# Patient Record
Sex: Female | Born: 1975
Health system: Southern US, Community
[De-identification: ages and names within clinical notes are randomized; demographics above are authoritative.]

## PROBLEM LIST (undated history)

## (undated) ENCOUNTER — Inpatient Hospital Stay (HOSPITAL_COMMUNITY): Payer: Self-pay

## (undated) DIAGNOSIS — D649 Anemia, unspecified: Secondary | ICD-10-CM

## (undated) DIAGNOSIS — I10 Essential (primary) hypertension: Secondary | ICD-10-CM

## (undated) DIAGNOSIS — J069 Acute upper respiratory infection, unspecified: Secondary | ICD-10-CM

## (undated) DIAGNOSIS — L509 Urticaria, unspecified: Secondary | ICD-10-CM

## (undated) DIAGNOSIS — T783XXA Angioneurotic edema, initial encounter: Secondary | ICD-10-CM

## (undated) HISTORY — PX: TONSILLECTOMY: SUR1361

## (undated) HISTORY — DX: Urticaria, unspecified: L50.9

## (undated) HISTORY — PX: MYOMECTOMY: SHX85

## (undated) HISTORY — DX: Angioneurotic edema, initial encounter: T78.3XXA

## (undated) HISTORY — DX: Acute upper respiratory infection, unspecified: J06.9

## (undated) HISTORY — PX: SINUS EXPLORATION: SHX5214

## (undated) HISTORY — PX: SINOSCOPY: SHX187

## (undated) HISTORY — PX: ADENOIDECTOMY: SUR15

---

## 2007-12-18 ENCOUNTER — Emergency Department (HOSPITAL_COMMUNITY): Admission: EM | Admit: 2007-12-18 | Discharge: 2007-12-19 | Payer: Self-pay | Admitting: Emergency Medicine

## 2011-06-28 LAB — RAPID STREP SCREEN (MED CTR MEBANE ONLY): Streptococcus, Group A Screen (Direct): NEGATIVE

## 2014-02-23 ENCOUNTER — Inpatient Hospital Stay (HOSPITAL_COMMUNITY): Payer: BC Managed Care – PPO

## 2014-02-23 ENCOUNTER — Inpatient Hospital Stay (HOSPITAL_COMMUNITY)
Admission: AD | Admit: 2014-02-23 | Discharge: 2014-02-23 | Disposition: A | Discharge status: Home / Self Care | Payer: BC Managed Care – PPO | Source: Ambulatory Visit | Attending: Obstetrics and Gynecology | Admitting: Obstetrics and Gynecology

## 2014-02-23 ENCOUNTER — Encounter (HOSPITAL_COMMUNITY): Payer: Self-pay

## 2014-02-23 DIAGNOSIS — O99330 Smoking (tobacco) complicating pregnancy, unspecified trimester: Secondary | ICD-10-CM | POA: Insufficient documentation

## 2014-02-23 DIAGNOSIS — O209 Hemorrhage in early pregnancy, unspecified: Secondary | ICD-10-CM

## 2014-02-23 DIAGNOSIS — I10 Essential (primary) hypertension: Secondary | ICD-10-CM

## 2014-02-23 DIAGNOSIS — O3410 Maternal care for benign tumor of corpus uteri, unspecified trimester: Secondary | ICD-10-CM | POA: Insufficient documentation

## 2014-02-23 DIAGNOSIS — O10019 Pre-existing essential hypertension complicating pregnancy, unspecified trimester: Secondary | ICD-10-CM | POA: Insufficient documentation

## 2014-02-23 DIAGNOSIS — O3411 Maternal care for benign tumor of corpus uteri, first trimester: Secondary | ICD-10-CM

## 2014-02-23 DIAGNOSIS — D259 Leiomyoma of uterus, unspecified: Secondary | ICD-10-CM

## 2014-02-23 HISTORY — DX: Anemia, unspecified: D64.9

## 2014-02-23 HISTORY — DX: Essential (primary) hypertension: I10

## 2014-02-23 LAB — URINALYSIS, ROUTINE W REFLEX MICROSCOPIC
BILIRUBIN URINE: NEGATIVE
Glucose, UA: NEGATIVE mg/dL
Ketones, ur: 15 mg/dL — AB
NITRITE: NEGATIVE
PH: 5.5 (ref 5.0–8.0)
Protein, ur: 30 mg/dL — AB
SPECIFIC GRAVITY, URINE: 1.01 (ref 1.005–1.030)
Urobilinogen, UA: 0.2 mg/dL (ref 0.0–1.0)

## 2014-02-23 LAB — URINE MICROSCOPIC-ADD ON

## 2014-02-23 LAB — ABO/RH: ABO/RH(D): O POS

## 2014-02-23 LAB — CBC
HCT: 45.6 % (ref 36.0–46.0)
HEMOGLOBIN: 16.5 g/dL — AB (ref 12.0–15.0)
MCH: 32.2 pg (ref 26.0–34.0)
MCHC: 36.2 g/dL — ABNORMAL HIGH (ref 30.0–36.0)
MCV: 89.1 fL (ref 78.0–100.0)
PLATELETS: 237 10*3/uL (ref 150–400)
RBC: 5.12 MIL/uL — AB (ref 3.87–5.11)
RDW: 12.7 % (ref 11.5–15.5)
WBC: 8.1 10*3/uL (ref 4.0–10.5)

## 2014-02-23 LAB — HCG, QUANTITATIVE, PREGNANCY: HCG, BETA CHAIN, QUANT, S: 8727 m[IU]/mL — AB (ref <5)

## 2014-02-23 LAB — POCT PREGNANCY, URINE: Preg Test, Ur: POSITIVE — AB

## 2014-02-23 NOTE — MAU Note (Signed)
Pt states LMP-01/07/2014, has not been to MD office thus far. Denies pain at present however began bleeding around 1315. One hour later passed quarter sized clot. Bleeding has slowed down

## 2014-02-23 NOTE — MAU Provider Note (Signed)
Chief Complaint: Vaginal Bleeding   First Provider Initiated Contact with Patient 02/23/14 1728     SUBJECTIVE HPI: Amanda Barrett is a 38 y.o. G1 at [redacted]w[redacted]d by LMP who presents with small moderate amount of bright red vaginal bleeding which has tapered to spotting. Had intercourse this morning. Denies any abdominal pain or cramping.  Has NOB visit at [place] in 5 days. She was seen there already for UPT and adjustment of antihypertensive meds. No hx fibroids.  Past Medical History  Diagnosis Date  . Hypertension   . Anemia     Pernicious   OB History  No data available   Past Surgical History  Procedure Laterality Date  . Tonsillectomy    . Sinus exploration     History   Social History  . Marital Status: Married    Spouse Name: N/A    Number of Children: N/A  . Years of Education: N/A   Occupational History  . Not on file.   Social History Main Topics  . Smoking status: Current Every Day Smoker -- 0.50 packs/day    Types: Cigarettes  . Smokeless tobacco: Never Used  . Alcohol Use: No  . Drug Use: No  . Sexual Activity: Yes    Birth Control/ Protection: None   Other Topics Concern  . Not on file   Social History Narrative  . No narrative on file   No current facility-administered medications on file prior to encounter.   No current outpatient prescriptions on file prior to encounter.   Not on File  ROS: Pertinent items in HPI  OBJECTIVE Blood pressure 139/81, pulse 71, temperature 98.2 F (36.8 C), temperature source Oral, resp. rate 16, last menstrual period 01/07/2014. GENERAL: Well-developed, well-nourished female in no acute distress.  HEENT: Normocephalic HEART: normal rate RESP: normal effort ABDOMEN: Soft, non-tender EXTREMITIES: Nontender, no edema NEURO: Alert and oriented SPECULUM EXAM: Streak of red blood on peri-pad; NEFG, small dark red blood in vault, cervix clean BIMANUAL: cervix L/C; uterus 10wks, no adnexal tenderness or  masses  LAB RESULTS Results for orders placed during the hospital encounter of 02/23/14 (from the past 24 hour(s))  URINALYSIS, ROUTINE W REFLEX MICROSCOPIC     Status: Abnormal   Collection Time    02/23/14  5:42 PM      Result Value Ref Range   Color, Urine RED (*) YELLOW   APPearance CLOUDY (*) CLEAR   Specific Gravity, Urine 1.010  1.005 - 1.030   pH 5.5  5.0 - 8.0   Glucose, UA NEGATIVE  NEGATIVE mg/dL   Hgb urine dipstick LARGE (*) NEGATIVE   Bilirubin Urine NEGATIVE  NEGATIVE   Ketones, ur 15 (*) NEGATIVE mg/dL   Protein, ur 30 (*) NEGATIVE mg/dL   Urobilinogen, UA 0.2  0.0 - 1.0 mg/dL   Nitrite NEGATIVE  NEGATIVE   Leukocytes, UA TRACE (*) NEGATIVE  URINE MICROSCOPIC-ADD ON     Status: Abnormal   Collection Time    02/23/14  5:42 PM      Result Value Ref Range   Squamous Epithelial / LPF RARE  RARE   WBC, UA 3-6  <3 WBC/hpf   RBC / HPF TOO NUMEROUS TO COUNT  <3 RBC/hpf   Bacteria, UA FEW (*) RARE  CBC     Status: Abnormal   Collection Time    02/23/14  5:50 PM      Result Value Ref Range   WBC 8.1  4.0 - 10.5 K/uL  RBC 5.12 (*) 3.87 - 5.11 MIL/uL   Hemoglobin 16.5 (*) 12.0 - 15.0 g/dL   HCT 45.6  36.0 - 46.0 %   MCV 89.1  78.0 - 100.0 fL   MCH 32.2  26.0 - 34.0 pg   MCHC 36.2 (*) 30.0 - 36.0 g/dL   RDW 12.7  11.5 - 15.5 %   Platelets 237  150 - 400 K/uL  ABO/RH     Status: None   Collection Time    02/23/14  5:50 PM      Result Value Ref Range   ABO/RH(D) O POS    POCT PREGNANCY, URINE     Status: Abnormal   Collection Time    02/23/14  5:53 PM      Result Value Ref Range   Preg Test, Ur POSITIVE (*) NEGATIVE  HCG, QUANTITATIVE, PREGNANCY     Status: Abnormal   Collection Time    02/23/14  5:55 PM      Result Value Ref Range   hCG, Beta Chain, Quant, S 8727 (*) <5 mIU/mL    IMAGING Preliminary report: IUP with FHR 84; fibroids: ant 2x 2.6x 2.6cm;, post 6.7x 6x 7cm  MAU COURSE Consulted Dr. Sandford Craze Office will schedule F/U US in about 2  wks  ASSESSMENT 1. Bleeding in early pregnancy   2. Uterine fibroids affecting pregnancy in first trimester, antepartum   3. Hypertension   38 yo G1 at [redacted]w[redacted]d by [place], viable IUP  PLAN Discharge home with bleeding precautions    Medication List         Ferrous Sulfate 27 MG Tabs  Take 1 tablet by mouth at bedtime.     NIFEdipine 90 MG 24 hr tablet  Commonly known as:  PROCARDIA XL/ADALAT-CC  Take 90 mg by mouth daily.     potassium chloride SA 20 MEQ tablet  Commonly known as:  K-DUR,KLOR-CON  Take 20 mEq by mouth daily.     prenatal multivitamin Tabs tablet  Take 1 tablet by mouth daily at 12 noon.     VIACTIV PO  Take 2 each by mouth at bedtime.       Follow-up Information   Follow up with Bovard-Stuckert, Jeral Fruit, MD. (Keep your scheduled prenatal appointment)    Specialty:  Obstetrics and Gynecology   Contact information:   [address] [place]  [address] [phone]        [name], CNM 02/23/2014  5:28 PM

## 2014-02-23 NOTE — Discharge Instructions (Signed)
Vaginal Bleeding During Pregnancy, First Trimester °A small amount of bleeding (spotting) from the vagina is relatively common in early pregnancy. It usually stops on its own. Various things may cause bleeding or spotting in early pregnancy. Some bleeding may be related to the pregnancy, and some may not. In most cases, the bleeding is normal and is not a problem. However, bleeding can also be a sign of something serious. Be sure to tell your health care provider about any vaginal bleeding right away. °Some possible causes of vaginal bleeding during the first trimester include: °· Infection or inflammation of the cervix. °· Growths (polyps) on the cervix. °· Miscarriage or threatened miscarriage. °· Pregnancy tissue has developed outside of the uterus and in a fallopian tube (tubal pregnancy). °· Tiny cysts have developed in the uterus instead of pregnancy tissue (molar pregnancy). °HOME CARE INSTRUCTIONS  °Watch your condition for any changes. The following actions may help to lessen any discomfort you are feeling: °· Follow your health care provider's instructions for limiting your activity. If your health care provider orders bed rest, you may need to stay in bed and only get up to use the bathroom. However, your health care provider may allow you to continue light activity. °· If needed, make plans for someone to help with your regular activities and responsibilities while you are on bed rest. °· Keep track of the number of pads you use each day, how often you change pads, and how soaked (saturated) they are. Write this down. °· Do not use tampons. Do not douche. °· Do not have sexual intercourse or orgasms until approved by your health care provider. °· If you pass any tissue from your vagina, save the tissue so you can show it to your health care provider. °· Only take over-the-counter or prescription medicines as directed by your health care provider. °· Do not take aspirin because it can make you  bleed. °· Keep all follow-up appointments as directed by your health care provider. °SEEK MEDICAL CARE IF: °· You have any vaginal bleeding during any part of your pregnancy. °· You have cramps or labor pains. °SEEK IMMEDIATE MEDICAL CARE IF:  °· You have severe cramps in your back or belly (abdomen). °· You have a fever, not controlled by medicine. °· You pass large clots or tissue from your vagina. °· Your bleeding increases. °· You feel lightheaded or weak, or you have fainting episodes. °· You have chills. °· You are leaking fluid or have a gush of fluid from your vagina. °· You pass out while having a bowel movement. °MAKE SURE YOU: °· Understand these instructions. °· Will watch your condition. °· Will get help right away if you are not doing well or get worse. °Document Released: 06/30/2005 Document Revised: 07/11/2013 Document Reviewed: 05/28/2013 °ExitCare® Patient Information ©2014 [employer]. ° °

## 2014-07-26 ENCOUNTER — Other Ambulatory Visit: Payer: Self-pay | Admitting: Obstetrics and Gynecology

## 2014-07-26 DIAGNOSIS — R234 Changes in skin texture: Secondary | ICD-10-CM

## 2014-08-05 ENCOUNTER — Encounter (HOSPITAL_COMMUNITY): Payer: Self-pay

## 2014-08-13 ENCOUNTER — Ambulatory Visit
Admission: RE | Admit: 2014-08-13 | Discharge: 2014-08-13 | Disposition: A | Discharge status: Home / Self Care | Payer: BC Managed Care – PPO | Source: Ambulatory Visit | Attending: Obstetrics and Gynecology | Admitting: Obstetrics and Gynecology

## 2014-08-13 DIAGNOSIS — R234 Changes in skin texture: Secondary | ICD-10-CM

## 2016-01-09 ENCOUNTER — Other Ambulatory Visit: Payer: Self-pay | Admitting: Obstetrics and Gynecology

## 2016-01-09 DIAGNOSIS — R234 Changes in skin texture: Secondary | ICD-10-CM

## 2016-01-09 MED FILL — METHYLDOPA 250 MG TABLET: 250 | 30 days supply | Qty: 120 | Fill #0

## 2016-01-14 ENCOUNTER — Ambulatory Visit
Admission: RE | Admit: 2016-01-14 | Discharge: 2016-01-14 | Disposition: A | Discharge status: Home / Self Care | Payer: BLUE CROSS/BLUE SHIELD | Source: Ambulatory Visit | Attending: Obstetrics and Gynecology | Admitting: Obstetrics and Gynecology

## 2016-01-14 DIAGNOSIS — R234 Changes in skin texture: Secondary | ICD-10-CM

## 2016-04-28 MED FILL — LETROZOLE 2.5 MG TABLET: 2.5 | 5 days supply | Qty: 5 | Fill #0

## 2016-04-30 MED FILL — LETROZOLE 2.5 MG TABLET: 2.5 | 5 days supply | Qty: 10 | Fill #0

## 2017-03-21 ENCOUNTER — Other Ambulatory Visit: Payer: Self-pay | Admitting: Obstetrics and Gynecology

## 2017-03-21 DIAGNOSIS — N6320 Unspecified lump in the left breast, unspecified quadrant: Secondary | ICD-10-CM

## 2017-03-29 ENCOUNTER — Ambulatory Visit
Admission: RE | Admit: 2017-03-29 | Discharge: 2017-03-29 | Disposition: A | Discharge status: Home / Self Care | Payer: 59 | Source: Ambulatory Visit | Attending: Obstetrics and Gynecology | Admitting: Obstetrics and Gynecology

## 2017-03-29 ENCOUNTER — Other Ambulatory Visit: Payer: Self-pay | Admitting: Obstetrics and Gynecology

## 2017-03-29 DIAGNOSIS — N6320 Unspecified lump in the left breast, unspecified quadrant: Secondary | ICD-10-CM

## 2017-12-09 MED FILL — METHYLDOPA 500 MG TABLET: 500 | 30 days supply | Qty: 60 | Fill #0

## 2018-07-10 ENCOUNTER — Encounter: Payer: Self-pay | Admitting: Family Medicine

## 2018-09-14 ENCOUNTER — Encounter: Payer: Self-pay | Admitting: Family Medicine

## 2018-09-14 ENCOUNTER — Ambulatory Visit (INDEPENDENT_AMBULATORY_CARE_PROVIDER_SITE_OTHER): Payer: 59 | Admitting: Family Medicine

## 2018-09-14 VITALS — BP 184/112 | HR 99 | Temp 97.9°F | Ht 68.0 in | Wt 248.4 lb

## 2018-09-14 DIAGNOSIS — I1A0 Resistant hypertension: Secondary | ICD-10-CM | POA: Insufficient documentation

## 2018-09-14 DIAGNOSIS — Z7689 Persons encountering health services in other specified circumstances: Secondary | ICD-10-CM | POA: Diagnosis not present

## 2018-09-14 DIAGNOSIS — Z720 Tobacco use: Secondary | ICD-10-CM

## 2018-09-14 DIAGNOSIS — Z23 Encounter for immunization: Secondary | ICD-10-CM | POA: Diagnosis not present

## 2018-09-14 DIAGNOSIS — I10 Essential (primary) hypertension: Secondary | ICD-10-CM | POA: Diagnosis not present

## 2018-09-14 DIAGNOSIS — N6019 Diffuse cystic mastopathy of unspecified breast: Secondary | ICD-10-CM

## 2018-09-14 DIAGNOSIS — F5101 Primary insomnia: Secondary | ICD-10-CM

## 2018-09-14 DIAGNOSIS — R4589 Other symptoms and signs involving emotional state: Secondary | ICD-10-CM

## 2018-09-14 DIAGNOSIS — F329 Major depressive disorder, single episode, unspecified: Secondary | ICD-10-CM

## 2018-09-14 DIAGNOSIS — Z6837 Body mass index (BMI) 37.0-37.9, adult: Secondary | ICD-10-CM

## 2018-09-14 HISTORY — DX: Resistant hypertension: I1A.0

## 2018-09-14 MED ORDER — LOSARTAN POTASSIUM-HCTZ 50-12.5 MG PO TABS: 1.0000 | ORAL_TABLET | Freq: Every day | ORAL | 3 refills | Status: DC

## 2018-09-14 MED ORDER — ZOLPIDEM TARTRATE 10 MG PO TABS: 10.0000 mg | ORAL_TABLET | Freq: Every day | ORAL | 2 refills | Status: DC

## 2018-09-14 MED ORDER — BUPROPION HCL ER (SR) 150 MG PO TB12: 150.0000 mg | ORAL_TABLET | Freq: Two times a day (BID) | ORAL | 3 refills | Status: DC

## 2018-09-14 NOTE — Patient Instructions (Addendum)
Hypertension Hypertension, commonly called high blood pressure, is when the force of blood pumping through the arteries is too strong. The arteries are the blood vessels that carry blood from the heart throughout the body. Hypertension forces the heart to work harder to pump blood and may cause arteries to become narrow or stiff. Having untreated or uncontrolled hypertension can cause heart attacks, strokes, kidney disease, and other problems. A blood pressure reading consists of a higher number over a lower number. Ideally, your blood pressure should be below 120/80. The first ("top") number is called the systolic pressure. It is a measure of the pressure in your arteries as your heart beats. The second ("bottom") number is called the diastolic pressure. It is a measure of the pressure in your arteries as the heart relaxes. What are the causes? The cause of this condition is not known. What increases the risk? Some risk factors for high blood pressure are under your control. Others are not. Factors you can change  Smoking.  Having type 2 diabetes mellitus, high cholesterol, or both.  Not getting enough exercise or physical activity.  Being overweight.  Having too much fat, sugar, calories, or salt (sodium) in your diet.  Drinking too much alcohol. Factors that are difficult or impossible to change  Having chronic kidney disease.  Having a family history of high blood pressure.  Age. Risk increases with age.  Race. You may be at higher risk if you are African-American.  Gender. Men are at higher risk than women before age 45. After age 65, women are at higher risk than men.  Having obstructive sleep apnea.  Stress. What are the signs or symptoms? Extremely high blood pressure (hypertensive crisis) may cause:  Headache.  Anxiety.  Shortness of breath.  Nosebleed.  Nausea and vomiting.  Severe chest pain.  Jerky movements you cannot control (seizures).  How is this  diagnosed? This condition is diagnosed by measuring your blood pressure while you are seated, with your arm resting on a surface. The cuff of the blood pressure monitor will be placed directly against the skin of your upper arm at the level of your heart. It should be measured at least twice using the same arm. Certain conditions can cause a difference in blood pressure between your right and left arms. Certain factors can cause blood pressure readings to be lower or higher than normal (elevated) for a short period of time:  When your blood pressure is higher when you are in a health care provider's office than when you are at home, this is called white coat hypertension. Most people with this condition do not need medicines.  When your blood pressure is higher at home than when you are in a health care provider's office, this is called masked hypertension. Most people with this condition may need medicines to control blood pressure.  If you have a high blood pressure reading during one visit or you have normal blood pressure with other risk factors:  You may be asked to return on a different day to have your blood pressure checked again.  You may be asked to monitor your blood pressure at home for 1 week or longer.  If you are diagnosed with hypertension, you may have other blood or imaging tests to help your health care provider understand your overall risk for other conditions. How is this treated? This condition is treated by making healthy lifestyle changes, such as eating healthy foods, exercising more, and reducing your alcohol intake. Your   health care provider may prescribe medicine if lifestyle changes are not enough to get your blood pressure under control, and if:  Your systolic blood pressure is above 130.  Your diastolic blood pressure is above 80.  Your personal target blood pressure may vary depending on your medical conditions, your age, and other factors. Follow these  instructions at home: Eating and drinking  Eat a diet that is high in fiber and potassium, and low in sodium, added sugar, and fat. An example eating plan is called the DASH (Dietary Approaches to Stop Hypertension) diet. To eat this way: ? Eat plenty of fresh fruits and vegetables. Try to fill half of your plate at each meal with fruits and vegetables. ? Eat whole grains, such as whole wheat pasta, brown rice, or whole grain bread. Fill about one quarter of your plate with whole grains. ? Eat or drink low-fat dairy products, such as skim milk or low-fat yogurt. ? Avoid fatty cuts of meat, processed or cured meats, and poultry with skin. Fill about one quarter of your plate with lean proteins, such as fish, chicken without skin, beans, eggs, and tofu. ? Avoid premade and processed foods. These tend to be higher in sodium, added sugar, and fat.  Reduce your daily sodium intake. Most people with hypertension should eat less than 1,500 mg of sodium a day.  Limit alcohol intake to no more than 1 drink a day for nonpregnant women and 2 drinks a day for men. One drink equals 12 oz of beer, 5 oz of wine, or 1 oz of hard liquor. Lifestyle  Work with your health care provider to maintain a healthy body weight or to lose weight. Ask what an ideal weight is for you.  Get at least 30 minutes of exercise that causes your heart to beat faster (aerobic exercise) most days of the week. Activities may include walking, swimming, or biking.  Include exercise to strengthen your muscles (resistance exercise), such as pilates or lifting weights, as part of your weekly exercise routine. Try to do these types of exercises for 30 minutes at least 3 days a week.  Do not use any products that contain nicotine or tobacco, such as cigarettes and e-cigarettes. If you need help quitting, ask your health care provider.  Monitor your blood pressure at home as told by your health care provider.  Keep all follow-up visits as  told by your health care provider. This is important. Medicines  Take over-the-counter and prescription medicines only as told by your health care provider. Follow directions carefully. Blood pressure medicines must be taken as prescribed.  Do not skip doses of blood pressure medicine. Doing this puts you at risk for problems and can make the medicine less effective.  Ask your health care provider about side effects or reactions to medicines that you should watch for. Contact a health care provider if:  You think you are having a reaction to a medicine you are taking.  You have headaches that keep coming back (recurring).  You feel dizzy.  You have swelling in your ankles.  You have trouble with your vision. Get help right away if:  You develop a severe headache or confusion.  You have unusual weakness or numbness.  You feel faint.  You have severe pain in your chest or abdomen.  You vomit repeatedly.  You have trouble breathing. Summary  Hypertension is when the force of blood pumping through your arteries is too strong. If this condition is not   controlled, it may put you at risk for serious complications.  Your personal target blood pressure may vary depending on your medical conditions, your age, and other factors. For most people, a normal blood pressure is less than 120/80.  Hypertension is treated with lifestyle changes, medicines, or a combination of both. Lifestyle changes include weight loss, eating a healthy, low-sodium diet, exercising more, and limiting alcohol. This information is not intended to replace advice given to you by your health care provider. Make sure you discuss any questions you have with your health care provider. Document Released: 09/20/2005 Document Revised: 08/18/2016 Document Reviewed: 08/18/2016 Elsevier Interactive Patient Education  2018 [employer].   Sciatica Rehab Ask your health care provider which exercises are safe for you. Do  exercises exactly as told by your health care provider and adjust them as directed. It is normal to feel mild stretching, pulling, tightness, or discomfort as you do these exercises, but you should stop right away if you feel sudden pain or your pain gets worse.Do not begin these exercises until told by your health care provider. Stretching and range of motion exercises These exercises warm up your muscles and joints and improve the movement and flexibility of your hips and your back. These exercises also help to relieve pain, numbness, and tingling. Exercise A: Sciatic nerve glide 1. Sit in a chair with your head facing down toward your chest. Place your hands behind your back. Let your shoulders slump forward. 2. Slowly straighten one of your knees while you tilt your head back as if you are looking toward the ceiling. Only straighten your leg as far as you can without making your symptoms worse. 3. Hold for __________ seconds. 4. Slowly return to the starting position. 5. Repeat with your other leg. Repeat __________ times. Complete this exercise __________ times a day. Exercise B: Knee to chest with hip adduction and internal rotation  1. Lie on your back on a firm surface with both legs straight. 2. Bend one of your knees and move it up toward your chest until you feel a gentle stretch in your lower back and buttock. Then, move your knee toward the shoulder that is on the opposite side from your leg. ? Hold your leg in this position by holding onto the front of your knee. 3. Hold for __________ seconds. 4. Slowly return to the starting position. 5. Repeat with your other leg. Repeat __________ times. Complete this exercise __________ times a day. Exercise C: Prone extension on elbows  1. Lie on your abdomen on a firm surface. A bed may be too soft for this exercise. 2. Prop yourself up on your elbows. 3. Use your arms to help lift your chest up until you feel a gentle stretch in your  abdomen and your lower back. ? This will place some of your body weight on your elbows. If this is uncomfortable, try stacking pillows under your chest. ? Your hips should stay down, against the surface that you are lying on. Keep your hip and back muscles relaxed. 4. Hold for __________ seconds. 5. Slowly relax your upper body and return to the starting position. Repeat __________ times. Complete this exercise __________ times a day. Strengthening exercises These exercises build strength and endurance in your back. Endurance is the ability to use your muscles for a long time, even after they get tired. Exercise D: Pelvic tilt 1. Lie on your back on a firm surface. Bend your knees and keep your feet flat. 2.  Tense your abdominal muscles. Tip your pelvis up toward the ceiling and flatten your lower back into the floor. ? To help with this exercise, you may place a small towel under your lower back and try to push your back into the towel. 3. Hold for __________ seconds. 4. Let your muscles relax completely before you repeat this exercise. Repeat __________ times. Complete this exercise __________ times a day. Exercise E: Alternating arm and leg raises  1. Get on your hands and knees on a firm surface. If you are on a hard floor, you may want to use padding to cushion your knees, such as an exercise mat. 2. Line up your arms and legs. Your hands should be below your shoulders, and your knees should be below your hips. 3. Lift your left leg behind you. At the same time, raise your right arm and straighten it in front of you. ? Do not lift your leg higher than your hip. ? Do not lift your arm higher than your shoulder. ? Keep your abdominal and back muscles tight. ? Keep your hips facing the ground. ? Do not arch your back. ? Keep your balance carefully, and do not hold your breath. 4. Hold for __________ seconds. 5. Slowly return to the starting position and repeat with your right leg and  your left arm. Repeat __________ times. Complete this exercise __________ times a day. Posture and body mechanics  Body mechanics refers to the movements and positions of your body while you do your daily activities. Posture is part of body mechanics. Good posture and healthy body mechanics can help to relieve stress in your body's tissues and joints. Good posture means that your spine is in its natural S-curve position (your spine is neutral), your shoulders are pulled back slightly, and your head is not tipped forward. The following are general guidelines for applying improved posture and body mechanics to your everyday activities. Standing   When standing, keep your spine neutral and your feet about hip-width apart. Keep a slight bend in your knees. Your ears, shoulders, and hips should line up.  When you do a task in which you stand in one place for a long time, place one foot up on a stable object that is 2-4 inches (5-10 cm) high, such as a footstool. This helps keep your spine neutral. Sitting   When sitting, keep your spine neutral and keep your feet flat on the floor. Use a footrest, if necessary, and keep your thighs parallel to the floor. Avoid rounding your shoulders, and avoid tilting your head forward.  When working at a desk or a computer, keep your desk at a height where your hands are slightly lower than your elbows. Slide your chair under your desk so you are close enough to maintain good posture.  When working at a computer, place your monitor at a height where you are looking straight ahead and you do not have to tilt your head forward or downward to look at the screen. Resting   When lying down and resting, avoid positions that are most painful for you.  If you have pain with activities such as sitting, bending, stooping, or squatting (flexion-based activities), lie in a position in which your body does not bend very much. For example, avoid curling up on your side with  your arms and knees near your chest (fetal position).  If you have pain with activities such as standing for a long time or reaching with your arms (extension-based  activities), lie with your spine in a neutral position and bend your knees slightly. Try the following positions: ? Lying on your side with a pillow between your knees. ? Lying on your back with a pillow under your knees. Lifting   When lifting objects, keep your feet at least shoulder-width apart and tighten your abdominal muscles.  Bend your knees and hips and keep your spine neutral. It is important to lift using the strength of your legs, not your back. Do not lock your knees straight out.  Always ask for help to lift heavy or awkward objects. This information is not intended to replace advice given to you by your health care provider. Make sure you discuss any questions you have with your health care provider. Document Released: 09/20/2005 Document Revised: 05/27/2016 Document Reviewed: 06/06/2015 Elsevier Interactive Patient Education  [employer].

## 2018-09-14 NOTE — Progress Notes (Signed)
Amanda Barrett is a 42 y.o. female  Chief Complaint  Patient presents with  . Establish Care    discuss bp and bp meds . last pap fall of 2017 and last mamm 03/2017    HPI: LABRITTANY Barrett is a 42 y.o. female here as a new patient to establish care with our office. She was previously seen by a physician in [place] but more recently saw a PA at that office.  She would like do discuss her blood pressure and BP meds. She has been off one of her meds (methyldopa) x months. She ran out of her other 2 BP meds 1 week ago. She would like to "start over" with BP regimen.  Pt states she feels fine despite her BP being elevated in the office today. Denies HA, vision changes, dizziness, CP, SOB, LE edema.   Specialists: none  Last CPE, labs: overdue  Last PAP: fall of 2017 - normal; due in fall 2020 Last mammo: due  Last Dexa: n/a Last colonoscopy: n/a  Med refills needed today: she is out of BP meds   Past Medical History:  Diagnosis Date  . Anemia    Pernicious  . Hypertension     Past Surgical History:  Procedure Laterality Date  . SINUS EXPLORATION    . TONSILLECTOMY      Social History   Socioeconomic History  . Marital status: Married    Spouse name: Not on file  . Number of children: Not on file  . Years of education: Not on file  . Highest education level: Not on file  Occupational History  . Not on file  Social Needs  . Financial resource strain: Not on file  . Food insecurity:    Worry: Not on file    Inability: Not on file  . Transportation needs:    Medical: Not on file    Non-medical: Not on file  Tobacco Use  . Smoking status: Current Every Day Smoker    Packs/day: 0.50    Types: Cigarettes  . Smokeless tobacco: Never Used  Substance and Sexual Activity  . Alcohol use: No  . Drug use: No  . Sexual activity: Yes    Birth control/protection: None  Lifestyle  . Physical activity:    Days per week: Not on file    Minutes per session: Not on file    . Stress: Not on file  Relationships  . Social connections:    Talks on phone: Not on file    Gets together: Not on file    Attends religious service: Not on file    Active member of club or organization: Not on file    Attends meetings of clubs or organizations: Not on file    Relationship status: Not on file  . Intimate partner violence:    Fear of current or ex partner: Not on file    Emotionally abused: Not on file    Physically abused: Not on file    Forced sexual activity: Not on file  Other Topics Concern  . Not on file  Social History Narrative  . Not on file    No family history on file.   Immunization History  Administered Date(s) Administered  . Influenza,inj,Quad PF,6+ Mos 09/14/2018  . Tdap 09/14/2018    Outpatient Encounter Medications as of 09/14/2018  Medication Sig  . albuterol (PROAIR HFA) 108 (90 Base) MCG/ACT inhaler ProAir HFA 90 mcg/actuation aerosol inhaler  INHALE 1-2 PUFFS EVERY 4-6 HOURS  .  buPROPion (WELLBUTRIN SR) 150 MG 12 hr tablet Take 1 tablet (150 mg total) by mouth 2 (two) times daily.  Marland Kitchen zolpidem (AMBIEN) 10 MG tablet Take 1 tablet (10 mg total) by mouth at bedtime.  . [DISCONTINUED] buPROPion (WELLBUTRIN SR) 150 MG 12 hr tablet bupropion HCl SR 150 mg tablet,12 hr sustained-release  TAKE 1 TABLET BY MOUTH TWICE A DAY  . [DISCONTINUED] labetalol (NORMODYNE) 100 MG tablet labetalol 100 mg tablet  TAKE 1 TABLET BY MOUTH 2 TIMES DAILY  . [DISCONTINUED] methyldopa (ALDOMET) 250 MG tablet methyldopa 250 mg tablet  TAKE 2 TABLETS BY MOUTH TWICE A DAY  . [DISCONTINUED] telmisartan-hydrochlorothiazide (MICARDIS HCT) 40-12.5 MG tablet Take 1 tablet by mouth daily.  . [DISCONTINUED] zolpidem (AMBIEN) 10 MG tablet zolpidem 10 mg tablet  TAKE 1 TABLET BY MOUTH AT BEDTIME  . Calcium-Vitamin D-Vitamin K (VIACTIV PO) Take 2 each by mouth at bedtime.  . Ferrous Sulfate 27 MG TABS Take 1 tablet by mouth at bedtime.  Marland Kitchen losartan-hydrochlorothiazide  (HYZAAR) 50-12.5 MG tablet Take 1 tablet by mouth daily.  . potassium chloride SA (K-DUR,KLOR-CON) 20 MEQ tablet Take 20 mEq by mouth daily.  . Prenatal Vit-Fe Fumarate-FA (PRENATAL MULTIVITAMIN) TABS tablet Take 1 tablet by mouth daily at 12 noon.  . [DISCONTINUED] NIFEdipine (PROCARDIA XL/ADALAT-CC) 90 MG 24 hr tablet Take 90 mg by mouth daily.   No facility-administered encounter medications on file as of 09/14/2018.      ROS: Gen: no fever, chills  Skin: no rash, itching ENT: no ear pain, ear drainage, nasal congestion, rhinorrhea, sinus pressure, sore throat Eyes: no blurry vision, double vision Resp: no cough, wheeze,SOB CV: no CP, palpitations, LE edema,  GI: no heartburn, n/v/d/c, abd pain GU: no dysuria, urgency, frequency, hematuria  MSK: no joint pain, myalgias, back pain Neuro: no dizziness, headache, weakness, vertigo; Lt sciatica intermittent Psych: no depression, anxiety, insomnia   No Known Allergies  BP (!) 184/112 (BP Location: Left Arm, Patient Position: Sitting, Cuff Size: Large)   Pulse 99   Temp 97.9 F (36.6 C) (Oral)   Ht 5\' 8"  (1.727 m)   Wt 248 lb 6.4 oz (112.7 kg)   LMP 08/22/2018   SpO2 96%   BMI 37.77 kg/m   BP Readings from Last 3 Encounters:  09/14/18 (!) 184/112  02/23/14 140/88     Physical Exam  Constitutional: She is oriented to person, place, and time. She appears well-developed and well-nourished. No distress.  Neck: No thyromegaly present.  Cardiovascular: Normal rate, regular rhythm, normal heart sounds and intact distal pulses.  No murmur heard. Pulmonary/Chest: Effort normal. No respiratory distress. She has no wheezes. She has no rhonchi.  Musculoskeletal:        General: No edema.  Lymphadenopathy:    She has no cervical adenopathy.  Neurological: She is alert and oriented to person, place, and time.  Skin: Skin is warm and dry.  Psychiatric: She has a normal mood and affect. Her behavior is normal.     A/P:  1.  Essential hypertension - uncontrolled and pt off 1/3 meds x months and 2/3 x 1 week - strong fam h/o HTN and pt diagnosed at 42yo - stressed importance of weight loss, low sodium diet, and regular CV exercise in HTN management - Basic metabolic panel Rx: - losartan-hydrochlorothiazide (HYZAAR) 50-12.5 MG tablet; Take 1 tablet by mouth daily.  Dispense: 90 tablet; Refill: 3 - pt will check BP 3-4x/wk x 1.5 wks and send MyChart message with readings.  Will likely have to increase dose at that time as I don't believe BP will be at goal. Pt will f/u in office in about 4 weeks for CPE, fasting labs, and HTN f/u  2. Encounter to establish care with new doctor  3. Fibrocystic breast disease (FCBD), unspecified laterality - MM Digital Diagnostic Bilat; Future  4. Tobacco use - pt states she has cut down significantly since starting this med and would like to continue with it Refill: - buPROPion (WELLBUTRIN SR) 150 MG 12 hr tablet; Take 1 tablet (150 mg total) by mouth 2 (two) times daily.  Dispense: 180 tablet; Refill: 3  5. Depressed mood Refill: - buPROPion (WELLBUTRIN SR) 150 MG 12 hr tablet; Take 1 tablet (150 mg total) by mouth 2 (two) times daily.  Dispense: 180 tablet; Refill: 3  6. Primary insomnia Refill: - zolpidem (AMBIEN) 10 MG tablet; Take 1 tablet (10 mg total) by mouth at bedtime.  Dispense: 30 tablet; Refill: 2 - pt has been on ambien x years, appears to be taking and filling appropriately  7. Adult BMI 37.0-37.9 kg/sq m - ALT - AST - Basic metabolic panel - Lipid panel  8. Need for immunization against influenza - Flu Vaccine QUAD 36+ mos IM  9. Need for Tdap vaccination - Tdap vaccine greater than or equal to 7yo IM  I spent 45 min with the patient today and greater than 50% was spent in counseling, coordination of care, education

## 2018-09-29 ENCOUNTER — Encounter: Payer: Self-pay | Admitting: Family Medicine

## 2018-09-29 ENCOUNTER — Other Ambulatory Visit: Payer: Self-pay | Admitting: Family Medicine

## 2018-09-29 DIAGNOSIS — I10 Essential (primary) hypertension: Secondary | ICD-10-CM

## 2018-09-29 MED ORDER — LOSARTAN POTASSIUM-HCTZ 100-25 MG PO TABS: 1.0000 | ORAL_TABLET | Freq: Every day | ORAL | 3 refills | Status: DC

## 2018-10-05 ENCOUNTER — Telehealth: Payer: Self-pay | Admitting: Family Medicine

## 2018-10-05 DIAGNOSIS — I10 Essential (primary) hypertension: Secondary | ICD-10-CM

## 2018-10-05 MED ORDER — LOSARTAN POTASSIUM-HCTZ 100-25 MG PO TABS: 1.0000 | ORAL_TABLET | Freq: Every day | ORAL | 3 refills | Status: DC

## 2018-10-05 NOTE — Telephone Encounter (Signed)
Pt called stating losartan-hydrochlorothiazide need to be send [facility] instead of [facility].   Rx cancel from [facility] and sent to [facility] as pt requested.

## 2018-10-06 ENCOUNTER — Other Ambulatory Visit: Payer: Self-pay

## 2018-10-06 MED ORDER — LOSARTAN POTASSIUM 100 MG PO TABS: 100.0000 mg | ORAL_TABLET | Freq: Every day | ORAL | 1 refills | Status: DC

## 2018-10-06 MED ORDER — HYDROCHLOROTHIAZIDE 25 MG PO TABS: 25.0000 mg | ORAL_TABLET | Freq: Every day | ORAL | 1 refills | Status: DC

## 2018-10-18 ENCOUNTER — Encounter: Payer: Self-pay | Admitting: Family Medicine

## 2018-10-18 ENCOUNTER — Ambulatory Visit (INDEPENDENT_AMBULATORY_CARE_PROVIDER_SITE_OTHER): Payer: 59 | Admitting: Family Medicine

## 2018-10-18 VITALS — BP 148/108 | HR 70 | Temp 98.6°F | Ht 66.0 in | Wt 246.0 lb

## 2018-10-18 DIAGNOSIS — I10 Essential (primary) hypertension: Secondary | ICD-10-CM

## 2018-10-18 DIAGNOSIS — Z1239 Encounter for other screening for malignant neoplasm of breast: Secondary | ICD-10-CM

## 2018-10-18 DIAGNOSIS — Z0000 Encounter for general adult medical examination without abnormal findings: Secondary | ICD-10-CM

## 2018-10-18 LAB — BASIC METABOLIC PANEL
BUN: 13 mg/dL (ref 6–23)
CO2: 23 meq/L (ref 19–32)
Calcium: 9.6 mg/dL (ref 8.4–10.5)
Chloride: 102 mEq/L (ref 96–112)
Creatinine, Ser: 1.08 mg/dL (ref 0.40–1.20)
GFR: 59.56 mL/min — ABNORMAL LOW (ref >60.00)
Glucose, Bld: 90 mg/dL (ref 70–99)
Potassium: 3.7 mEq/L (ref 3.5–5.1)
Sodium: 138 mEq/L (ref 135–145)

## 2018-10-18 LAB — AST: AST: 31 U/L (ref 0–37)

## 2018-10-18 LAB — LIPID PANEL
CHOL/HDL RATIO: 3
Cholesterol: 174 mg/dL (ref 0–200)
HDL: 64.7 mg/dL (ref >39.00)
LDL Cholesterol: 95 mg/dL (ref 0–99)
NonHDL: 109.49
Triglycerides: 70 mg/dL (ref 0.0–149.0)
VLDL: 14 mg/dL (ref 0.0–40.0)

## 2018-10-18 LAB — ALT: ALT: 47 U/L — ABNORMAL HIGH (ref 0–35)

## 2018-10-18 MED ORDER — AMLODIPINE BESYLATE 10 MG PO TABS: 10.0000 mg | ORAL_TABLET | Freq: Every day | ORAL | 3 refills | Status: DC

## 2018-10-18 NOTE — Progress Notes (Signed)
Amanda Barrett is a 43 y.o. female  Chief Complaint  Patient presents with  . Annual Exam    Patient is here today for a CPE without PAP.  She is currently fasting.  She is due for a Mammogram at [place] and will call to schedule if orders are placed in system.  Immunizations are UTD.  Marland Kitchen Hypertension    She is also here today to F/U with HTN. At last visit she was started on HCTZ and Losartan which have been increased since she was seen to HCTZ 25mg  1qd & Losartan 100mg  1qd.  She said she feels that her BP is doing better but still needs adjusting.     HPI: Amanda Barrett is a 43 y.o. female here for annual CPE, fasting labs. She is UTD on her PAP. She is due for mammo and needs referral. Immunizations are UTD.  She is also here to f/u on HTN. She is taking HCTZ 25mg  daily and losartan 100mg  daily. Her BP has improved but is not at goal. She feels fine and denies HA, dizziness, vision changes, CP, SOB, LE edema.  Last PAP: fall 2018 - due in fall 2020 Last mammo: due Last Dexa: n/a Last colonoscopy: n/a  Diet/Exercise: she is cautious about her sodium intake; she does not routinely exercise  Med refills needed today? no   Past Medical History:  Diagnosis Date  . Anemia    Pernicious  . Hypertension     Past Surgical History:  Procedure Laterality Date  . SINUS EXPLORATION    . TONSILLECTOMY      Social History   Socioeconomic History  . Marital status: Married    Spouse name: Not on file  . Number of children: Not on file  . Years of education: Not on file  . Highest education level: Not on file  Occupational History  . Not on file  Social Needs  . Financial resource strain: Not on file  . Food insecurity:    Worry: Not on file    Inability: Not on file  . Transportation needs:    Medical: Not on file    Non-medical: Not on file  Tobacco Use  . Smoking status: Current Every Day Smoker    Packs/day: 0.50    Types: Cigarettes  . Smokeless tobacco:  Never Used  Substance and Sexual Activity  . Alcohol use: No  . Drug use: No  . Sexual activity: Yes    Birth control/protection: None  Lifestyle  . Physical activity:    Days per week: Not on file    Minutes per session: Not on file  . Stress: Not on file  Relationships  . Social connections:    Talks on phone: Not on file    Gets together: Not on file    Attends religious service: Not on file    Active member of club or organization: Not on file    Attends meetings of clubs or organizations: Not on file    Relationship status: Not on file  . Intimate partner violence:    Fear of current or ex partner: Not on file    Emotionally abused: Not on file    Physically abused: Not on file    Forced sexual activity: Not on file  Other Topics Concern  . Not on file  Social History Narrative  . Not on file    No family history on file.   Immunization History  Administered Date(s) Administered  .  Influenza,inj,Quad PF,6+ Mos 09/14/2018  . Tdap 09/14/2018    Outpatient Encounter Medications as of 10/18/2018  Medication Sig  . albuterol (PROAIR HFA) 108 (90 Base) MCG/ACT inhaler ProAir HFA 90 mcg/actuation aerosol inhaler  INHALE 1-2 PUFFS EVERY 4-6 HOURS  . buPROPion (WELLBUTRIN SR) 150 MG 12 hr tablet Take 1 tablet (150 mg total) by mouth 2 (two) times daily.  . hydrochlorothiazide (HYDRODIURIL) 25 MG tablet Take 1 tablet (25 mg total) by mouth daily.  Marland Kitchen losartan (COZAAR) 100 MG tablet Take 1 tablet (100 mg total) by mouth daily.  Marland Kitchen zolpidem (AMBIEN) 10 MG tablet Take 1 tablet (10 mg total) by mouth at bedtime.  Marland Kitchen amLODipine (NORVASC) 10 MG tablet Take 1 tablet (10 mg total) by mouth daily.  . [DISCONTINUED] Calcium-Vitamin D-Vitamin K (VIACTIV PO) Take 2 each by mouth at bedtime.  . [DISCONTINUED] Ferrous Sulfate 27 MG TABS Take 1 tablet by mouth at bedtime.  . [DISCONTINUED] potassium chloride SA (K-DUR,KLOR-CON) 20 MEQ tablet Take 20 mEq by mouth daily.  . [DISCONTINUED]  Prenatal Vit-Fe Fumarate-FA (PRENATAL MULTIVITAMIN) TABS tablet Take 1 tablet by mouth daily at 12 noon.   No facility-administered encounter medications on file as of 10/18/2018.      ROS: Gen: no fever, chills  Skin: no rash, itching ENT: no ear pain, ear drainage, nasal congestion, rhinorrhea, sinus pressure, sore throat Eyes: no blurry vision, double vision Resp: no cough, wheeze,SOB Breast: no breast tenderness, no nipple discharge, no breast masses CV: no CP, palpitations, LE edema,  GI: no heartburn, n/v/d/c, abd pain GU: no dysuria, urgency, frequency, hematuria; no vaginal itching, odor, discharge MSK: no joint pain, myalgias, back pain Neuro: no dizziness, headache, weakness, vertigo Psych: no depression, anxiety, insomnia   No Known Allergies  BP (!) 150/112 (BP Location: Right Arm, Cuff Size: Large)   Pulse 70   Temp 98.6 F (37 C) (Oral)   Ht 5\' 6"  (1.676 m)   Wt 246 lb (111.6 kg)   SpO2 98%   BMI 39.71 kg/m   BP Readings from Last 3 Encounters:  10/18/18 (!) 150/112  09/14/18 (!) 184/112  02/23/14 140/88     Physical Exam   A/P:  1. Annual physical exam - immunizations, PAP UTD; due for mammo - due for dental exam, no vision issues - cont with healthy diet and encouraged pt to start regular exercise routine - Basic metabolic panel - ALT - AST - Lipid panel - next CPE in 1 year  2. Essential hypertension - improved but not at goal - Basic metabolic panel - cont HCTZ 25mg  daily and losartan 100mg  daily Rx: - amLODipine (NORVASC) 10 MG tablet; Take 1 tablet (10 mg total) by mouth daily.  Dispense: 90 tablet; Refill: 3 - pt will check BP at home 3x/wk and send readings in 2-3 wks via MyChart  3. Screening for breast cancer - MM DIGITAL SCREENING BILATERAL; Future

## 2018-10-18 NOTE — Patient Instructions (Addendum)
Omron blood pressure cuff/machine

## 2018-10-19 ENCOUNTER — Encounter: Payer: Self-pay | Admitting: Family Medicine

## 2018-10-25 ENCOUNTER — Encounter: Payer: Self-pay | Admitting: Family Medicine

## 2018-10-25 DIAGNOSIS — I10 Essential (primary) hypertension: Secondary | ICD-10-CM

## 2018-11-06 ENCOUNTER — Other Ambulatory Visit (INDEPENDENT_AMBULATORY_CARE_PROVIDER_SITE_OTHER): Payer: 59

## 2018-11-06 DIAGNOSIS — I10 Essential (primary) hypertension: Secondary | ICD-10-CM | POA: Diagnosis not present

## 2018-11-07 LAB — BASIC METABOLIC PANEL
BUN: 14 mg/dL (ref 6–23)
CHLORIDE: 101 meq/L (ref 96–112)
CO2: 26 mEq/L (ref 19–32)
Calcium: 9.4 mg/dL (ref 8.4–10.5)
Creatinine, Ser: 0.92 mg/dL (ref 0.40–1.20)
GFR: 67.41 mL/min (ref >60.00)
Glucose, Bld: 116 mg/dL — ABNORMAL HIGH (ref 70–99)
Potassium: 3.4 mEq/L — ABNORMAL LOW (ref 3.5–5.1)
Sodium: 139 mEq/L (ref 135–145)

## 2018-11-07 LAB — TSH: TSH: 2.05 u[IU]/mL (ref 0.35–4.50)

## 2018-11-09 ENCOUNTER — Ambulatory Visit (HOSPITAL_COMMUNITY)
Admission: RE | Admit: 2018-11-09 | Discharge: 2018-11-09 | Disposition: A | Discharge status: Home / Self Care | Payer: 59 | Source: Ambulatory Visit | Attending: Vascular Surgery | Admitting: Vascular Surgery

## 2018-11-09 DIAGNOSIS — I10 Essential (primary) hypertension: Secondary | ICD-10-CM

## 2018-11-10 ENCOUNTER — Other Ambulatory Visit: Payer: Self-pay | Admitting: Family Medicine

## 2018-11-10 ENCOUNTER — Encounter: Payer: Self-pay | Admitting: Family Medicine

## 2018-11-10 DIAGNOSIS — N269 Renal sclerosis, unspecified: Secondary | ICD-10-CM

## 2018-11-10 LAB — PTH, INTACT AND CALCIUM
Calcium: 9.2 mg/dL (ref 8.6–10.2)
PTH: 33 pg/mL (ref 14–64)

## 2018-11-10 LAB — ALDOSTERONE + RENIN ACTIVITY W/ RATIO
ALDO / PRA Ratio: 2.9 Ratio (ref 0.9–28.9)
Aldosterone: 21 ng/dL
Renin Activity: 7.2 ng/mL/h — ABNORMAL HIGH (ref 0.25–5.82)

## 2018-11-20 ENCOUNTER — Ambulatory Visit
Admission: RE | Admit: 2018-11-20 | Discharge: 2018-11-20 | Disposition: A | Discharge status: Home / Self Care | Payer: 59 | Source: Ambulatory Visit | Attending: Family Medicine | Admitting: Family Medicine

## 2018-11-20 DIAGNOSIS — Z1239 Encounter for other screening for malignant neoplasm of breast: Secondary | ICD-10-CM

## 2018-12-04 ENCOUNTER — Encounter: Payer: Self-pay | Admitting: Family Medicine

## 2018-12-05 ENCOUNTER — Other Ambulatory Visit: Payer: Self-pay | Admitting: Family Medicine

## 2018-12-05 DIAGNOSIS — I10 Essential (primary) hypertension: Secondary | ICD-10-CM

## 2018-12-05 MED ORDER — CARVEDILOL 12.5 MG PO TABS: 12.5000 mg | ORAL_TABLET | Freq: Two times a day (BID) | ORAL | 3 refills | Status: DC

## 2018-12-13 ENCOUNTER — Encounter: Payer: Self-pay | Admitting: Family Medicine

## 2018-12-13 DIAGNOSIS — F5101 Primary insomnia: Secondary | ICD-10-CM

## 2018-12-14 MED ORDER — ZOLPIDEM TARTRATE 10 MG PO TABS: 10.0000 mg | ORAL_TABLET | Freq: Every day | ORAL | 2 refills | Status: DC

## 2018-12-14 NOTE — Addendum Note (Signed)
Addended by: Ronnald Nian on: 12/14/2018 10:10 AM   Modules accepted: Orders

## 2018-12-18 ENCOUNTER — Other Ambulatory Visit: Payer: Self-pay

## 2018-12-18 ENCOUNTER — Ambulatory Visit (INDEPENDENT_AMBULATORY_CARE_PROVIDER_SITE_OTHER)
Admission: RE | Admit: 2018-12-18 | Discharge: 2018-12-18 | Disposition: A | Discharge status: Home / Self Care | Payer: 59 | Source: Ambulatory Visit | Attending: Family Medicine | Admitting: Family Medicine

## 2018-12-18 DIAGNOSIS — N269 Renal sclerosis, unspecified: Secondary | ICD-10-CM | POA: Diagnosis not present

## 2018-12-18 MED ORDER — IOHEXOL 350 MG/ML SOLN
100.0000 mL | Freq: Once | INTRAVENOUS | Status: AC | PRN
  Administered 2018-12-18: 100 mL via INTRAVENOUS

## 2018-12-20 ENCOUNTER — Encounter: Payer: Self-pay | Admitting: Family Medicine

## 2018-12-20 DIAGNOSIS — I10 Essential (primary) hypertension: Secondary | ICD-10-CM

## 2018-12-21 MED ORDER — CARVEDILOL 25 MG PO TABS: 25.0000 mg | ORAL_TABLET | Freq: Two times a day (BID) | ORAL | 3 refills | Status: DC

## 2019-01-03 ENCOUNTER — Telehealth: Payer: Self-pay

## 2019-01-03 ENCOUNTER — Other Ambulatory Visit: Payer: Self-pay

## 2019-01-03 MED ORDER — LOSARTAN POTASSIUM-HCTZ 100-25 MG PO TABS: 1.0000 | ORAL_TABLET | Freq: Every day | ORAL | 3 refills | Status: DC

## 2019-01-03 NOTE — Telephone Encounter (Signed)
rx sent to pharmacy on file pt aware.

## 2019-01-03 NOTE — Telephone Encounter (Signed)
Left pt VM to call back, and also sent a my chart message to see if she would be okay with this change.

## 2019-01-03 NOTE — Telephone Encounter (Signed)
If the combination losartan/hctz 100-25mg  is available that would be an alternative as pt is taking both med now but separately, not as combo pill. She would need to be instructed that new Rx is for both meds so that she doesn't end up taking double dose of HCTZ

## 2019-01-03 NOTE — Telephone Encounter (Signed)
Dr. Loletha Grayer please advise, Received message from pt pharmacy stating that losartan potassium 100 mg is now on back order along with the 25 and 50 strengths. Their asking for alternative med to be sent in.

## 2019-01-03 NOTE — Telephone Encounter (Signed)
Yes please. Thank you

## 2019-01-03 NOTE — Telephone Encounter (Signed)
Pt is okay with this change, Pt states that she was on this before it went on back order before.Pt aware that once she receives this prescription she is not to take the HCTZ alone anymore. Verified from pharmacy that they had this combination in.  Would you like me to send this in?

## 2019-02-18 ENCOUNTER — Encounter: Payer: Self-pay | Admitting: Family Medicine

## 2019-02-21 ENCOUNTER — Other Ambulatory Visit: Payer: Self-pay | Admitting: Family Medicine

## 2019-02-21 DIAGNOSIS — R4589 Other symptoms and signs involving emotional state: Secondary | ICD-10-CM

## 2019-02-21 DIAGNOSIS — Z720 Tobacco use: Secondary | ICD-10-CM

## 2019-02-21 MED ORDER — BUPROPION HCL ER (SR) 150 MG PO TB12: 150.0000 mg | ORAL_TABLET | Freq: Two times a day (BID) | ORAL | 3 refills | Status: DC

## 2019-03-15 ENCOUNTER — Encounter: Payer: Self-pay | Admitting: Family Medicine

## 2019-03-15 DIAGNOSIS — F5101 Primary insomnia: Secondary | ICD-10-CM

## 2019-03-16 MED ORDER — ZOLPIDEM TARTRATE 10 MG PO TABS: 10.0000 mg | ORAL_TABLET | Freq: Every evening | ORAL | 2 refills | Status: DC | PRN

## 2019-06-18 ENCOUNTER — Other Ambulatory Visit: Payer: Self-pay | Admitting: Family Medicine

## 2019-06-18 DIAGNOSIS — F5101 Primary insomnia: Secondary | ICD-10-CM

## 2019-06-18 NOTE — Telephone Encounter (Signed)
Dr. Loletha Grayer please advise, last refill 6/12 #30 2 refills. Last OV 1/15 no upcoming appts. Pt aware of your absence until 9/16

## 2019-06-19 NOTE — Telephone Encounter (Signed)
Dr. Zigmund Daniel please advise on absence of Dr. Loletha Grayer pt sent mychart message saying she was going out of town if we could send in today.

## 2019-07-24 ENCOUNTER — Other Ambulatory Visit: Payer: Self-pay | Admitting: Family Medicine

## 2019-07-24 DIAGNOSIS — F5101 Primary insomnia: Secondary | ICD-10-CM

## 2019-07-25 ENCOUNTER — Telehealth: Payer: Self-pay

## 2019-07-25 MED ORDER — ZOLPIDEM TARTRATE 10 MG PO TABS: 10.0000 mg | ORAL_TABLET | Freq: Every evening | ORAL | 0 refills | Status: DC | PRN

## 2019-07-25 NOTE — Telephone Encounter (Signed)
Rx refilled but pt needs VV or in-office visit for med check/routine f/u before any further refills will be sent. Thanks!

## 2019-07-25 NOTE — Telephone Encounter (Signed)
msg sent up to scheduling.

## 2019-07-25 NOTE — Telephone Encounter (Signed)
Done

## 2019-07-25 NOTE — Telephone Encounter (Signed)
Can we get pt set up for a follow up appointment? Can be in person or virtual - she hasn't seen Dr. Loletha Grayer since January. Thanks!

## 2019-07-26 ENCOUNTER — Ambulatory Visit (INDEPENDENT_AMBULATORY_CARE_PROVIDER_SITE_OTHER): Payer: Commercial Managed Care - PPO | Admitting: Family Medicine

## 2019-07-26 ENCOUNTER — Other Ambulatory Visit: Payer: Self-pay

## 2019-07-26 ENCOUNTER — Encounter: Payer: Self-pay | Admitting: Family Medicine

## 2019-07-26 VITALS — Ht 66.0 in

## 2019-07-26 DIAGNOSIS — F5101 Primary insomnia: Secondary | ICD-10-CM

## 2019-07-26 NOTE — Progress Notes (Signed)
Virtual Visit via Video Note  I connected with Amanda Barrett on 07/26/19 at  3:30 PM EDT by a video enabled telemedicine application and verified that I am speaking with the correct person using two identifiers. Location patient: home Location provider: work or home office Persons participating in the virtual visit: patient, provider  I discussed the limitations of evaluation and management by telemedicine and the availability of in person appointments. The patient expressed understanding and agreed to proceed.  Chief Complaint  Patient presents with  . Follow-up    pt wants to discuss meication     HPI: Amanda Barrett is a 43 y.o. female for routine f/u on insomnia for which she is taking ambien 10mg  qHS. Medication is effective, pt is sleeping well. No reported side effects. No falls.  Pt has no concerns/issues/compaints. Pt denies HA, vision changes, dizziness, CP, SOB, n/v/d/c.     Past Medical History:  Diagnosis Date  . Anemia    Pernicious  . Hypertension     Past Surgical History:  Procedure Laterality Date  . SINUS EXPLORATION    . TONSILLECTOMY      History reviewed. No pertinent family history.  Social History   Tobacco Use  . Smoking status: Current Every Day Smoker    Packs/day: 0.50    Types: Cigarettes  . Smokeless tobacco: Never Used  Substance Use Topics  . Alcohol use: No  . Drug use: No     Current Outpatient Medications:  .  albuterol (PROAIR HFA) 108 (90 Base) MCG/ACT inhaler, ProAir HFA 90 mcg/actuation aerosol inhaler  INHALE 1-2 PUFFS EVERY 4-6 HOURS, Disp: , Rfl:  .  amLODipine (NORVASC) 10 MG tablet, Take 1 tablet (10 mg total) by mouth daily., Disp: 90 tablet, Rfl: 3 .  buPROPion (WELLBUTRIN SR) 150 MG 12 hr tablet, Take 1 tablet (150 mg total) by mouth 2 (two) times daily., Disp: 180 tablet, Rfl: 3 .  carvedilol (COREG) 25 MG tablet, Take 1 tablet (25 mg total) by mouth 2 (two) times daily with a meal., Disp: 180 tablet,  Rfl: 3 .  losartan-hydrochlorothiazide (HYZAAR) 100-25 MG tablet, Take 1 tablet by mouth daily., Disp: 90 tablet, Rfl: 3 .  zolpidem (AMBIEN) 10 MG tablet, Take 1 tablet (10 mg total) by mouth at bedtime as needed for sleep., Disp: 30 tablet, Rfl: 0  No Known Allergies    ROS: See pertinent positives and negatives per HPI.   EXAM:  VITALS per patient if applicable: Ht 5\' 6"  (1.676 m)   BMI 39.71 kg/m    GENERAL: alert, oriented, appears well and in no acute distress  HEENT: atraumatic, conjunctiva clear, no obvious abnormalities on inspection of external nose and ears  NECK: normal movements of the head and neck  LUNGS: on inspection no signs of respiratory distress, breathing rate appears normal, no obvious gross SOB, gasping or wheezing, no conversational dyspnea  CV: no obvious cyanosis  MS: moves all visible extremities without noticeable abnormality  PSYCH/NEURO: pleasant and cooperative, no obvious depression or anxiety, speech and thought processing grossly intact   ASSESSMENT AND PLAN:  1. Primary insomnia - controlled and well-managed on current med w/o side effects - ambien 10mg  qHS refilled yesterday - controlled substance database reviewed and appropriate - f/u in 3 mo - pt will schedule in-person OV for CPE, fasting labs, and med refill   I discussed the assessment and treatment plan with the patient. The patient was provided an opportunity to ask questions and all  were answered. The patient agreed with the plan and demonstrated an understanding of the instructions.   The patient was advised to call back or seek an in-person evaluation if the symptoms worsen or if the condition fails to improve as anticipated.   Amanda Median, DO

## 2019-08-20 ENCOUNTER — Other Ambulatory Visit: Payer: Self-pay

## 2019-08-20 DIAGNOSIS — Z20822 Contact with and (suspected) exposure to covid-19: Secondary | ICD-10-CM

## 2019-08-22 LAB — NOVEL CORONAVIRUS, NAA: SARS-CoV-2, NAA: NOT DETECTED

## 2019-08-24 ENCOUNTER — Other Ambulatory Visit: Payer: Self-pay | Admitting: Family Medicine

## 2019-08-24 DIAGNOSIS — F5101 Primary insomnia: Secondary | ICD-10-CM

## 2019-09-25 ENCOUNTER — Other Ambulatory Visit: Payer: Self-pay | Admitting: Family Medicine

## 2019-09-25 DIAGNOSIS — F5101 Primary insomnia: Secondary | ICD-10-CM

## 2019-09-26 NOTE — Telephone Encounter (Signed)
Charlotte please help in absence of Dr. C this week.  Last refill was 08/24/2019--30 tab. Last ov was 07/2019--3 mo f/u PMP checked [facility] tabs were: 08/24/2019 07/25/2019 06/19/2019.

## 2019-10-10 ENCOUNTER — Other Ambulatory Visit: Payer: Self-pay | Admitting: Cardiology

## 2019-10-10 DIAGNOSIS — Z20822 Contact with and (suspected) exposure to covid-19: Secondary | ICD-10-CM

## 2019-10-11 ENCOUNTER — Other Ambulatory Visit: Payer: Self-pay

## 2019-10-11 ENCOUNTER — Encounter: Payer: Self-pay | Admitting: Family Medicine

## 2019-10-11 DIAGNOSIS — I10 Essential (primary) hypertension: Secondary | ICD-10-CM

## 2019-10-11 LAB — NOVEL CORONAVIRUS, NAA: SARS-CoV-2, NAA: NOT DETECTED

## 2019-10-11 MED ORDER — AMLODIPINE BESYLATE 10 MG PO TABS: 10.0000 mg | ORAL_TABLET | Freq: Every day | ORAL | 3 refills | Status: DC

## 2019-10-24 ENCOUNTER — Other Ambulatory Visit: Payer: Self-pay

## 2019-10-25 ENCOUNTER — Encounter: Payer: Self-pay | Admitting: Family Medicine

## 2019-10-25 ENCOUNTER — Ambulatory Visit (INDEPENDENT_AMBULATORY_CARE_PROVIDER_SITE_OTHER): Payer: Commercial Managed Care - PPO | Admitting: Family Medicine

## 2019-10-25 VITALS — BP 138/100 | HR 68 | Temp 96.4°F | Ht 66.0 in | Wt 261.4 lb

## 2019-10-25 DIAGNOSIS — Z1231 Encounter for screening mammogram for malignant neoplasm of breast: Secondary | ICD-10-CM

## 2019-10-25 DIAGNOSIS — F5101 Primary insomnia: Secondary | ICD-10-CM | POA: Diagnosis not present

## 2019-10-25 DIAGNOSIS — Z23 Encounter for immunization: Secondary | ICD-10-CM | POA: Diagnosis not present

## 2019-10-25 DIAGNOSIS — Z124 Encounter for screening for malignant neoplasm of cervix: Secondary | ICD-10-CM

## 2019-10-25 DIAGNOSIS — Z0000 Encounter for general adult medical examination without abnormal findings: Secondary | ICD-10-CM | POA: Diagnosis not present

## 2019-10-25 DIAGNOSIS — I10 Essential (primary) hypertension: Secondary | ICD-10-CM

## 2019-10-25 MED ORDER — ZOLPIDEM TARTRATE 10 MG PO TABS: 10.0000 mg | ORAL_TABLET | Freq: Every day | ORAL | 0 refills | Status: DC

## 2019-10-25 NOTE — Progress Notes (Signed)
Amanda Barrett is a 44 y.o. female  Chief Complaint  Patient presents with  . Annual Exam    Pt has no complaints today and will receive the flu shot. Pt will schedule her pap with her GYN. Pt not fasting for lab work.    HPI: Amanda Barrett is a 44 y.o. female here for annual CPE. She is not fasting for labs so will RTO for fasting lab appt in the near future. She needs a mammo referral and has a GYN with whom she will schedule PAP.  She is agreeable to receiving flu vaccine today. She needs a refill of ambien which she takes for insomnia. Med is effective, no side effects.  Pt has HTN, does not regularly check BP at home. Denies HA, dizziness, vision changes, CP, SOB, palpitations, LE edema.  Past Medical History:  Diagnosis Date  . Anemia    Pernicious  . Hypertension     Past Surgical History:  Procedure Laterality Date  . SINUS EXPLORATION    . TONSILLECTOMY      Social History   Socioeconomic History  . Marital status: Married    Spouse name: Not on file  . Number of children: Not on file  . Years of education: Not on file  . Highest education level: Not on file  Occupational History  . Not on file  Tobacco Use  . Smoking status: Current Every Day Smoker    Packs/day: 0.50    Types: Cigarettes  . Smokeless tobacco: Never Used  Substance and Sexual Activity  . Alcohol use: No  . Drug use: No  . Sexual activity: Yes    Birth control/protection: None  Other Topics Concern  . Not on file  Social History Narrative  . Not on file   Social Determinants of Health   Financial Resource Strain:   . Difficulty of Paying Living Expenses: Not on file  Food Insecurity:   . Worried About Charity fundraiser in the Last Year: Not on file  . Ran Out of Food in the Last Year: Not on file  Transportation Needs:   . Lack of Transportation (Medical): Not on file  . Lack of Transportation (Non-Medical): Not on file  Physical Activity:   . Days of Exercise  per Week: Not on file  . Minutes of Exercise per Session: Not on file  Stress:   . Feeling of Stress : Not on file  Social Connections:   . Frequency of Communication with Friends and Family: Not on file  . Frequency of Social Gatherings with Friends and Family: Not on file  . Attends Religious Services: Not on file  . Active Member of Clubs or Organizations: Not on file  . Attends Archivist Meetings: Not on file  . Marital Status: Not on file  Intimate Partner Violence:   . Fear of Current or Ex-Partner: Not on file  . Emotionally Abused: Not on file  . Physically Abused: Not on file  . Sexually Abused: Not on file    History reviewed. No pertinent family history.   Immunization History  Administered Date(s) Administered  . Influenza,inj,Quad PF,6+ Mos 09/14/2018  . Tdap 09/14/2018    Outpatient Encounter Medications as of 10/25/2019  Medication Sig  . albuterol (PROAIR HFA) 108 (90 Base) MCG/ACT inhaler ProAir HFA 90 mcg/actuation aerosol inhaler  INHALE 1-2 PUFFS EVERY 4-6 HOURS  . amLODipine (NORVASC) 10 MG tablet Take 1 tablet (10 mg total) by mouth daily.  Marland Kitchen  buPROPion (WELLBUTRIN SR) 150 MG 12 hr tablet Take 1 tablet (150 mg total) by mouth 2 (two) times daily.  . carvedilol (COREG) 25 MG tablet Take 1 tablet (25 mg total) by mouth 2 (two) times daily with a meal.  . losartan-hydrochlorothiazide (HYZAAR) 100-25 MG tablet Take 1 tablet by mouth daily.  Marland Kitchen zolpidem (AMBIEN) 10 MG tablet TAKE 1 TABLET BY MOUTH EVERY DAY AT BEDTIME AS NEEDED FOR SLEEP   No facility-administered encounter medications on file as of 10/25/2019.     ROS: Gen: no fever, chills  Skin: no rash, itching ENT: no ear pain, ear drainage, nasal congestion, rhinorrhea, sinus pressure, sore throat Eyes: no blurry vision, double vision Resp: no cough, wheeze,SOB CV: no CP, palpitations, LE edema,  GI: no heartburn, n/v/d/c, abd pain GU: no dysuria, urgency, frequency, hematuria MSK: no  joint pain, myalgias, back pain Neuro: no dizziness, headache, weakness, vertigo Psych: no depression, anxiety, insomnia   No Known Allergies  BP (!) 138/100 (BP Location: Left Arm, Patient Position: Sitting, Cuff Size: Large)   Pulse 68   Temp (!) 96.4 F (35.8 C) (Temporal)   Ht 5\' 6"  (1.676 m)   Wt 261 lb 6.4 oz (118.6 kg)   LMP 10/14/2019   SpO2 97%   BMI 42.19 kg/m  BP Readings from Last 3 Encounters:  10/25/19 (!) 138/100  10/18/18 (!) 148/108  09/14/18 (!) 184/112    Physical Exam  Constitutional: She is oriented to person, place, and time. She appears well-developed and well-nourished. No distress.  HENT:  Head: Normocephalic and atraumatic.  Right Ear: Tympanic membrane and ear canal normal.  Left Ear: Tympanic membrane and ear canal normal.  Nose: Nose normal.  Mouth/Throat: Oropharynx is clear and moist and mucous membranes are normal.  Eyes: Pupils are equal, round, and reactive to light. Conjunctivae are normal.  Neck: No thyromegaly present.  Cardiovascular: Normal rate, regular rhythm, normal heart sounds and intact distal pulses.  No murmur heard. Pulmonary/Chest: Effort normal and breath sounds normal. No respiratory distress. She has no wheezes. She has no rhonchi.  Abdominal: Soft. Bowel sounds are normal. She exhibits no distension and no mass. There is no abdominal tenderness.  Musculoskeletal:        General: No edema.     Cervical back: Neck supple.  Lymphadenopathy:    She has no cervical adenopathy.  Neurological: She is alert and oriented to person, place, and time. She exhibits normal muscle tone. Coordination normal.  Skin: Skin is warm and dry.  Psychiatric: She has a normal mood and affect. Her behavior is normal.     A/P:  1. Primary insomnia - stable, controlled on current med w/o side effects - controlled substance database reviewed and appropriate Refill: - zolpidem (AMBIEN) 10 MG tablet; Take 1 tablet (10 mg total) by mouth at  bedtime.  Dispense: 30 tablet; Refill: 0 - Pain Mgmt, Profile 8 w/Conf, U; Future - q33mo f/u  2. Annual physical exam - mammo referral today - pt to schedule GYN appt for PAP - discussed importance of regular CV exercise, healthy diet, adequate sleep - dental and vision UTD - ALT; Future - AST; Future - Basic metabolic panel; Future - Lipid panel; Future - VITAMIN D 25 Hydroxy (Vit-D Deficiency, Fractures); Future - next CPE in 1 year  3. Essential hypertension - not at goal - increase coreg to 50mg  BID, cont rest of meds as currently taking - low sodium diet, regular CV exercise - Basic metabolic panel;  Future - check BP at home and send readings via MyChart in 2 wks  4. Screening for cervical cancer - pt will schedule appt with GYN  5. Encounter for screening mammogram for malignant neoplasm of breast - MM DIGITAL SCREENING BILATERAL; Future  6. Need for immunization against influenza - Flu Vaccine QUAD 36+ mos IM   This visit occurred during the SARS-CoV-2 public health emergency.  Safety protocols were in place, including screening questions prior to the visit, additional usage of staff PPE, and extensive cleaning of exam room while observing appropriate contact time as indicated for disinfecting solutions.

## 2019-10-25 NOTE — Patient Instructions (Signed)
Health Maintenance Due  Topic Date Due  . PAP SMEAR-Modifier  04/09/1999  . INFLUENZA VACCINE  05/05/2019    Depression screen PHQ 2/9 10/18/2018  Decreased Interest 0  Down, Depressed, Hopeless 0  PHQ - 2 Score 0

## 2019-10-29 ENCOUNTER — Other Ambulatory Visit: Payer: Commercial Managed Care - PPO

## 2019-10-29 ENCOUNTER — Encounter: Payer: Self-pay | Admitting: Family Medicine

## 2019-10-29 ENCOUNTER — Other Ambulatory Visit: Payer: Self-pay | Admitting: Family Medicine

## 2019-10-29 MED ORDER — METHYLPREDNISOLONE 4 MG PO TBPK: ORAL_TABLET | ORAL | 0 refills | Status: DC

## 2019-10-29 NOTE — Telephone Encounter (Signed)
Rx sent in.  F/u with Dr. Loletha Grayer if not improving. Thanks!

## 2019-10-29 NOTE — Telephone Encounter (Signed)
Please see message and advise.  Thank you. ° °

## 2019-11-26 ENCOUNTER — Other Ambulatory Visit: Payer: Self-pay | Admitting: Family Medicine

## 2019-11-26 DIAGNOSIS — F5101 Primary insomnia: Secondary | ICD-10-CM

## 2019-11-26 NOTE — Telephone Encounter (Signed)
Last OV 10/25/19 Last fill 10/25/19 #30/0

## 2019-12-10 ENCOUNTER — Encounter: Payer: Self-pay | Admitting: Family Medicine

## 2019-12-10 DIAGNOSIS — M5440 Lumbago with sciatica, unspecified side: Secondary | ICD-10-CM

## 2019-12-10 DIAGNOSIS — M5430 Sciatica, unspecified side: Secondary | ICD-10-CM

## 2019-12-12 MED ORDER — GABAPENTIN 100 MG PO CAPS: ORAL_CAPSULE | ORAL | 1 refills | Status: DC

## 2019-12-26 ENCOUNTER — Other Ambulatory Visit: Payer: Self-pay | Admitting: Family Medicine

## 2019-12-26 NOTE — Telephone Encounter (Signed)
Pt said she was at work right now but didn't think she was completely out of this medication but was going to check once home and call back to provide [place] with her last few BP readings once home.

## 2019-12-26 NOTE — Telephone Encounter (Signed)
LVM for the pt to call back, need to see how her BP reading at home--see Dr. Loletha Grayer note from last ov 10/2019.

## 2019-12-30 ENCOUNTER — Other Ambulatory Visit: Payer: Self-pay | Admitting: Family Medicine

## 2019-12-30 DIAGNOSIS — I10 Essential (primary) hypertension: Secondary | ICD-10-CM

## 2019-12-31 NOTE — Telephone Encounter (Signed)
Last Ov--10/25/19 Last fill--12/21/18 Qty: 180 t. With 3 refills  Dr. Loletha Grayer on 10/25/19 I seen in your notes you wanted to increase from 25 mg to 50 mg BID.  BP readings was to be sent on My Chart over 2 weeks after last visit.  Just checking that dosage before I just sent in.   Thank you!

## 2020-01-01 MED ORDER — CARVEDILOL 25 MG PO TABS: 50.0000 mg | ORAL_TABLET | Freq: Two times a day (BID) | ORAL | 3 refills | Status: DC

## 2020-01-01 NOTE — Telephone Encounter (Signed)
Yes Rx should be for 50mg  tabs 1 tab po BID #180 3RF. Please send to pharm. Thank you

## 2020-02-21 ENCOUNTER — Encounter: Payer: Self-pay | Admitting: Family Medicine

## 2020-02-21 ENCOUNTER — Other Ambulatory Visit: Payer: Self-pay | Admitting: Family Medicine

## 2020-02-21 DIAGNOSIS — F5101 Primary insomnia: Secondary | ICD-10-CM

## 2020-02-21 DIAGNOSIS — M5430 Sciatica, unspecified side: Secondary | ICD-10-CM

## 2020-02-21 NOTE — Telephone Encounter (Signed)
MC-Plz see refill req/thx dmf 

## 2020-03-02 ENCOUNTER — Other Ambulatory Visit: Payer: Self-pay | Admitting: Family Medicine

## 2020-03-02 DIAGNOSIS — Z720 Tobacco use: Secondary | ICD-10-CM

## 2020-03-02 DIAGNOSIS — R4589 Other symptoms and signs involving emotional state: Secondary | ICD-10-CM

## 2020-03-23 ENCOUNTER — Other Ambulatory Visit: Payer: Self-pay | Admitting: Family Medicine

## 2020-03-24 NOTE — Telephone Encounter (Signed)
Last OV 10/25/19 Last fill 12/28/19  #30/2

## 2020-04-28 ENCOUNTER — Other Ambulatory Visit: Payer: Self-pay | Admitting: Family Medicine

## 2020-04-28 DIAGNOSIS — F5101 Primary insomnia: Secondary | ICD-10-CM

## 2020-04-29 NOTE — Telephone Encounter (Signed)
Last OV 10/29/19 Last fill 02/21/20 #30/1

## 2020-06-24 ENCOUNTER — Encounter: Payer: Self-pay | Admitting: Family Medicine

## 2020-07-07 ENCOUNTER — Other Ambulatory Visit: Payer: Self-pay | Admitting: Family Medicine

## 2020-07-07 DIAGNOSIS — F5101 Primary insomnia: Secondary | ICD-10-CM

## 2020-07-07 NOTE — Telephone Encounter (Signed)
Received a refill request for:  Zolpidem 10 mg LR 04/30/20 LOV 10/25/19 FOV  None scheduled.  Please review and advise.  Thanks.  Dm/cma

## 2020-07-08 NOTE — Telephone Encounter (Signed)
Patient notified VIA phone that Rx was sent to pharmacy and will call back to schedule a VV appt due to not having her schedule in from of her.  Dm/cma

## 2020-07-08 NOTE — Telephone Encounter (Signed)
Refill sent. Pt needs f/u appt d/t med being controlled substance. VV is fine and then due in 10/2020 for in-person appt

## 2020-07-15 ENCOUNTER — Other Ambulatory Visit: Payer: Self-pay | Admitting: Family Medicine

## 2020-08-16 ENCOUNTER — Other Ambulatory Visit: Payer: Self-pay | Admitting: Family Medicine

## 2020-08-21 ENCOUNTER — Telehealth: Payer: Self-pay

## 2020-08-21 MED ORDER — LOSARTAN POTASSIUM-HCTZ 100-25 MG PO TABS: 1.0000 | ORAL_TABLET | Freq: Every day | ORAL | 0 refills | Status: DC

## 2020-08-21 NOTE — Telephone Encounter (Signed)
Pt calling to request medication refill.  Pt  inform me that she will be out of medication Losartan before her appt on Sep 10, 2020.  Please advise.

## 2020-08-21 NOTE — Telephone Encounter (Signed)
Due to up coming appointment refill sent to the pharmacy.  Patient notified [place] phone.  Dm/cma

## 2020-09-10 ENCOUNTER — Telehealth (INDEPENDENT_AMBULATORY_CARE_PROVIDER_SITE_OTHER): Payer: Commercial Managed Care - PPO | Admitting: Family Medicine

## 2020-09-10 ENCOUNTER — Encounter: Payer: Self-pay | Admitting: Family Medicine

## 2020-09-10 VITALS — Ht 66.0 in | Wt 225.0 lb

## 2020-09-10 DIAGNOSIS — I10 Essential (primary) hypertension: Secondary | ICD-10-CM | POA: Diagnosis not present

## 2020-09-10 DIAGNOSIS — J019 Acute sinusitis, unspecified: Secondary | ICD-10-CM | POA: Diagnosis not present

## 2020-09-10 DIAGNOSIS — F5101 Primary insomnia: Secondary | ICD-10-CM | POA: Diagnosis not present

## 2020-09-10 DIAGNOSIS — Z720 Tobacco use: Secondary | ICD-10-CM | POA: Diagnosis not present

## 2020-09-10 DIAGNOSIS — R4589 Other symptoms and signs involving emotional state: Secondary | ICD-10-CM

## 2020-09-10 MED ORDER — ZOLPIDEM TARTRATE 10 MG PO TABS: 10.0000 mg | ORAL_TABLET | Freq: Every day | ORAL | 2 refills | Status: DC

## 2020-09-10 MED ORDER — LOSARTAN POTASSIUM-HCTZ 100-25 MG PO TABS: 1.0000 | ORAL_TABLET | Freq: Every day | ORAL | 3 refills | Status: DC

## 2020-09-10 MED ORDER — AMLODIPINE BESYLATE 10 MG PO TABS: 10.0000 mg | ORAL_TABLET | Freq: Every day | ORAL | 3 refills | Status: DC

## 2020-09-10 MED ORDER — ALBUTEROL SULFATE HFA 108 (90 BASE) MCG/ACT IN AERS: 2.0000 | INHALATION_SPRAY | RESPIRATORY_TRACT | 2 refills | Status: AC | PRN

## 2020-09-10 MED ORDER — CARVEDILOL 25 MG PO TABS: 50.0000 mg | ORAL_TABLET | Freq: Two times a day (BID) | ORAL | 3 refills | Status: DC

## 2020-09-10 MED ORDER — BUPROPION HCL ER (SR) 150 MG PO TB12: 150.0000 mg | ORAL_TABLET | Freq: Two times a day (BID) | ORAL | 3 refills | Status: DC

## 2020-09-10 MED ORDER — AMOXICILLIN-POT CLAVULANATE 875-125 MG PO TABS: 1.0000 | ORAL_TABLET | Freq: Two times a day (BID) | ORAL | 0 refills | Status: DC

## 2020-09-10 MED ORDER — FLUTICASONE PROPIONATE 50 MCG/ACT NA SUSP: 2.0000 | Freq: Every day | NASAL | 0 refills | Status: AC

## 2020-09-10 NOTE — Progress Notes (Signed)
Virtual Visit via Video Note  I connected with Amanda Barrett on 09/10/20 at  2:30 PM EST by a video enabled telemedicine application and verified that I am speaking with the correct person using two identifiers. Location patient: home Location provider: work or home office Persons participating in the virtual visit: patient, provider  I discussed the limitations of evaluation and management by telemedicine and the availability of in person appointments. The patient expressed understanding and agreed to proceed.  Chief Complaint  Patient presents with  . Acute Visit    nasal congestion, cough, sinus pressure, sneezing x 1 week. pt also needs refills on all meds.  at home Covid test negative last night. declines flu shot.       HPI: Amanda Barrett is a 44 y.o. female who complains of 1 week h/o nasal congestion, sinus pressure, sneezing, PND, cough.  She was having Lt ear pain and this has resolved.  Home covid test last night was negative. No fever, chills, myalgias, loss of taste or smell. She started taking OTC allergy tab and sudafed without relief.   She also needs all meds refilled.      Past Medical History:  Diagnosis Date  . Anemia    Pernicious  . Hypertension     Past Surgical History:  Procedure Laterality Date  . SINUS EXPLORATION    . TONSILLECTOMY      History reviewed. No pertinent family history.  Social History   Tobacco Use  . Smoking status: Current Every Day Smoker    Packs/day: 0.50    Types: Cigarettes  . Smokeless tobacco: Never Used  Substance Use Topics  . Alcohol use: No  . Drug use: No     Current Outpatient Medications:  .  albuterol (PROAIR HFA) 108 (90 Base) MCG/ACT inhaler, Inhale 2 puffs into the lungs every 4 (four) hours as needed for wheezing or shortness of breath., Disp: 1 each, Rfl: 2 .  amLODipine (NORVASC) 10 MG tablet, Take 1 tablet (10 mg total) by mouth daily., Disp: 90 tablet, Rfl: 3 .  buPROPion  (WELLBUTRIN SR) 150 MG 12 hr tablet, Take 1 tablet (150 mg total) by mouth 2 (two) times daily., Disp: 180 tablet, Rfl: 3 .  carvedilol (COREG) 25 MG tablet, Take 2 tablets (50 mg total) by mouth 2 (two) times daily., Disp: 180 tablet, Rfl: 3 .  losartan-hydrochlorothiazide (HYZAAR) 100-25 MG tablet, Take 1 tablet by mouth daily., Disp: 90 tablet, Rfl: 3 .  zolpidem (AMBIEN) 10 MG tablet, Take 1 tablet (10 mg total) by mouth at bedtime., Disp: 30 tablet, Rfl: 2 .  amoxicillin-clavulanate (AUGMENTIN) 875-125 MG tablet, Take 1 tablet by mouth 2 (two) times daily., Disp: 20 tablet, Rfl: 0 .  fluticasone (FLONASE) 50 MCG/ACT nasal spray, Place 2 sprays into both nostrils daily., Disp: 16 g, Rfl: 0  No Known Allergies    ROS: See pertinent positives and negatives per HPI.   EXAM:  VITALS per patient if applicable: Ht 5\' 6"  (1.676 m)   Wt 225 lb (102.1 kg) Comment: pt reported  BMI 36.32 kg/m   BP Readings from Last 3 Encounters:  10/25/19 (!) 138/100  10/18/18 (!) 148/108  09/14/18 (!) 184/112   GENERAL: alert, oriented, no acute distress  HEENT: atraumatic, conjunctiva clear, no obvious abnormalities on inspection of external nose and ears  NECK: normal movements of the head and neck  LUNGS: on inspection no signs of respiratory distress, breathing rate appears normal, no obvious gross SOB,  gasping or wheezing, no conversational dyspnea  CV: no obvious cyanosis  PSYCH/NEURO: pleasant and cooperative, speech and thought processing grossly intact   ASSESSMENT AND PLAN:  1. Primary insomnia - controlled, at goal - database reviewed and appropriate Refill: - zolpidem (AMBIEN) 10 MG tablet; Take 1 tablet (10 mg total) by mouth at bedtime.  Dispense: 30 tablet; Refill: 2  2. Essential hypertension - pt with consisently elevated BP on 3 meds, has had eval by nephro in the past - will recheck pts BP at OV for CPE in early 2022 Refill: - carvedilol (COREG) 25 MG tablet; Take 2  tablets (50 mg total) by mouth 2 (two) times daily.  Dispense: 180 tablet; Refill: 3 - amLODipine (NORVASC) 10 MG tablet; Take 1 tablet (10 mg total) by mouth daily.  Dispense: 90 tablet; Refill: 3 - losartan-hydrochlorothiazide (HYZAAR) 100-25 MG tablet; Take 1 tablet by mouth daily.  Dispense: 90 tablet; Refill: 3  3. Tobacco use - pt continues to smoke Refill: - buPROPion (WELLBUTRIN SR) 150 MG 12 hr tablet; Take 1 tablet (150 mg total) by mouth 2 (two) times daily.  Dispense: 180 tablet; Refill: 3  4. Depressed mood - controlled - buPROPion (WELLBUTRIN SR) 150 MG 12 hr tablet; Take 1 tablet (150 mg total) by mouth 2 (two) times daily.  Dispense: 180 tablet; Refill: 3  5. Acute non-recurrent sinusitis, unspecified location - symptoms x 1 week, not improving - cont increased water intake, nasal saline spray - add mucinex BID Rx: - fluticasone (FLONASE) 50 MCG/ACT nasal spray; Place 2 sprays into both nostrils daily.  Dispense: 16 g; Refill: 0 - amoxicillin-clavulanate (AUGMENTIN) 875-125 MG tablet; Take 1 tablet by mouth 2 (two) times daily.  Dispense: 20 tablet; Refill: 0 - f/u if symptoms worsen or do not improve in 7-10 days     I discussed the assessment and treatment plan with the patient. The patient was provided an opportunity to ask questions and all were answered. The patient agreed with the plan and demonstrated an understanding of the instructions.   The patient was advised to call back or seek an in-person evaluation if the symptoms worsen or if the condition fails to improve as anticipated.   [name], DO

## 2020-09-13 ENCOUNTER — Other Ambulatory Visit: Payer: Self-pay | Admitting: Family Medicine

## 2020-09-13 DIAGNOSIS — I10 Essential (primary) hypertension: Secondary | ICD-10-CM

## 2020-09-15 NOTE — Telephone Encounter (Signed)
Last VV 09/10/20 Last fill 09/10/20  #90/3

## 2020-09-23 ENCOUNTER — Ambulatory Visit: Payer: Commercial Managed Care - PPO

## 2020-10-08 ENCOUNTER — Other Ambulatory Visit: Payer: Self-pay | Admitting: Family Medicine

## 2020-10-08 DIAGNOSIS — F5101 Primary insomnia: Secondary | ICD-10-CM

## 2020-10-08 NOTE — Telephone Encounter (Signed)
This was refilled on 09/10/20 with 30 tabs and 2 RF

## 2020-10-08 NOTE — Telephone Encounter (Signed)
Last VV 09/10/20 Last fill 09/10/20  #30/2

## 2020-10-13 ENCOUNTER — Other Ambulatory Visit: Payer: Self-pay

## 2020-10-13 ENCOUNTER — Ambulatory Visit
Admission: RE | Admit: 2020-10-13 | Discharge: 2020-10-13 | Disposition: A | Discharge status: Home / Self Care | Payer: Commercial Managed Care - PPO | Source: Ambulatory Visit | Attending: Family Medicine | Admitting: Family Medicine

## 2020-10-13 DIAGNOSIS — Z1231 Encounter for screening mammogram for malignant neoplasm of breast: Secondary | ICD-10-CM

## 2020-10-16 NOTE — Telephone Encounter (Signed)
Ok. Thank you.

## 2021-01-01 ENCOUNTER — Other Ambulatory Visit: Payer: Self-pay | Admitting: Family Medicine

## 2021-01-01 DIAGNOSIS — I10 Essential (primary) hypertension: Secondary | ICD-10-CM

## 2021-01-08 ENCOUNTER — Other Ambulatory Visit: Payer: Self-pay | Admitting: Family Medicine

## 2021-01-08 DIAGNOSIS — F5101 Primary insomnia: Secondary | ICD-10-CM

## 2021-03-23 ENCOUNTER — Ambulatory Visit: Payer: Self-pay | Admitting: Podiatry

## 2021-03-25 ENCOUNTER — Ambulatory Visit: Payer: Commercial Managed Care - PPO | Admitting: Podiatry

## 2021-03-25 ENCOUNTER — Other Ambulatory Visit: Payer: Self-pay

## 2021-03-25 ENCOUNTER — Encounter: Payer: Self-pay | Admitting: Podiatry

## 2021-03-25 DIAGNOSIS — L600 Ingrowing nail: Secondary | ICD-10-CM

## 2021-03-25 NOTE — Patient Instructions (Addendum)
Place 1/4 cup of epsom salts in a quart of warm tap water.  Submerge your foot or feet in the solution and soak for 20 minutes.  This soak should be done twice a day.  Next, remove your foot or feet from solution, blot dry the affected area. Apply ointment and cover if instructed by your doctor.   IF YOUR SKIN BECOMES IRRITATED WHILE USING THESE INSTRUCTIONS, IT IS OKAY TO SWITCH TO  WHITE VINEGAR AND WATER.  As another alternative soak, you may use antibacterial soap and water.  Monitor for any signs/symptoms of infection. Call the office immediately if any occur or go directly to the emergency room. Call with any questions/concerns.  Ingrown Toenail An ingrown toenail occurs when the corner or sides of a toenail grow into the surrounding skin. This causes discomfort and pain. The big toe is most commonly affected, but any of the toes can be affected. If an ingrown toenail is not treated, it can become infected. What are the causes? This condition may be caused by:  Wearing shoes that are too small or tight.  An injury, such as stubbing your toe or having your toe stepped on.  Improper cutting or care of your toenails.  Having nail or foot abnormalities that were present from birth (congenital abnormalities), such as having a nail that is too big for your toe. What increases the risk? The following factors may make you more likely to develop ingrown toenails:  Age. Nails tend to get thicker with age, so ingrown nails are more common among older people.  Cutting your toenails incorrectly, such as cutting them very short or cutting them unevenly. An ingrown toenail is more likely to get infected if you have:  Diabetes.  Blood flow (circulation) problems. What are the signs or symptoms? Symptoms of an ingrown toenail may include:  Pain, soreness, or tenderness.  Redness.  Swelling.  Hardening of the skin that surrounds the toenail. Signs that an ingrown toenail may be infected  include:  Fluid or pus.  Symptoms that get worse instead of better. How is this diagnosed? An ingrown toenail may be diagnosed based on your medical history, your symptoms, and a physical exam. If you have fluid or blood coming from your toenail, a sample may be collected to test for the specific type of bacteria that is causing the infection. How is this treated? Treatment depends on how severe your ingrown toenail is. You may be able to care for your toenail at home.  If you have an infection, you may be prescribed antibiotic medicines.  If you have fluid or pus draining from your toenail, your health care provider may drain it.  If you have trouble walking, you may be given crutches to use.  If you have a severe or infected ingrown toenail, you may need a procedure to remove part or all of the nail. Follow these instructions at home: Foot care  Do not pick at your toenail or try to remove it yourself.  Soak your foot in warm, soapy water. Do this for 20 minutes, 3 times a day, or as often as told by your health care provider. This helps to keep your toe clean and keep your skin soft.  Wear shoes that fit well and are not too tight. Your health care provider may recommend that you wear open-toed shoes while you heal.  Trim your toenails regularly and carefully. Cut your toenails straight across to prevent injury to the skin at the corners   of the toenail. Do not cut your nails in a curved shape.  Keep your feet clean and dry to help prevent infection.   Medicines  Take over-the-counter and prescription medicines only as told by your health care provider.  If you were prescribed an antibiotic, take it as told by your health care provider. Do not stop taking the antibiotic even if you start to feel better. Activity  Return to your normal activities as told by your health care provider. Ask your health care provider what activities are safe for you.  Avoid activities that cause  pain. General instructions  If your health care provider told you to use crutches to help you move around, use them as instructed.  Keep all follow-up visits as told by your health care provider. This is important. Contact a health care provider if:  You have more redness, swelling, pain, or other symptoms that do not improve with treatment.  You have fluid, blood, or pus coming from your toenail. Get help right away if:  You have a red streak on your skin that starts at your foot and spreads up your leg.  You have a fever. Summary  An ingrown toenail occurs when the corner or sides of a toenail grow into the surrounding skin. This causes discomfort and pain. The big toe is most commonly affected, but any of the toes can be affected.  If an ingrown toenail is not treated, it can become infected.  Fluid or pus draining from your toenail is a sign of infection. Your health care provider may need to drain it. You may be given antibiotics to treat the infection.  Trimming your toenails regularly and properly can help you prevent an ingrown toenail. This information is not intended to replace advice given to you by your health care provider. Make sure you discuss any questions you have with your health care provider. Document Revised: 01/12/2019 Document Reviewed: 06/08/2017 Elsevier Patient Education  2021 [employer]  

## 2021-03-26 NOTE — Progress Notes (Signed)
Subjective:   Patient ID: Amanda Barrett, female   DOB: 45 y.o.   MRN: [id number]   HPI Patient presents with chronic ingrown toenail deformity left over right stating the left big nail second nail are ingrown and the right big toenail are ingrown.  States that she tries to trim them tries to soak them without relief of symptoms and they are gradually becoming worse and she has not noted any drainage.  Patient does smoke half pack per day and tries to be active   Review of Systems  All other systems reviewed and are negative.      Objective:  Physical Exam Vitals and nursing note reviewed.  Constitutional:      Appearance: She is well-developed.  Pulmonary:     Effort: Pulmonary effort is normal.  Musculoskeletal:        General: Normal range of motion.  Skin:    General: Skin is warm.  Neurological:     Mental Status: She is alert.    Neurovascular status intact muscle strength was found to be adequate range of motion adequate.  Patient is noted to have incurvated medial border of the hallux nail bilateral and the second nail left with irritation of the corner and also lifting of the entire nail bed left and right hallux and slightly into the second toe.  Patient had no active redness no active drainage has good digital perfusion well oriented x3     Assessment:  Chronic ingrown toenail deformity left hallux and second toe and right hallux painful when pressed no active drainage or any indications of infection currently     Plan:  H&P reviewed condition recommended correction of left foot and explained ingrown toenail removal.  Patient wants surgery and I allowed her to read consent form understanding risk and she wants procedure today I infiltrated the left hallux second toe 60 mg like Marcaine mixture in each did sterile prep of each toe and using sterile instrumentation remove the medial border left hallux medial border left second digit exposed matrix and applied phenol 3  applications 30 seconds followed by alcohol lavage and sterile dressing.  Gave instructions on soaks and to leave dressings on 24 hours but take off earlier if any throbbing were to occur and encouraged her to call questions concerns which may arise

## 2021-04-13 ENCOUNTER — Other Ambulatory Visit: Payer: Self-pay | Admitting: Family Medicine

## 2021-04-13 DIAGNOSIS — F5101 Primary insomnia: Secondary | ICD-10-CM

## 2021-04-13 NOTE — Telephone Encounter (Signed)
Last VV 09/10/20 Last fill 01/09/21  #30/2

## 2021-05-18 ENCOUNTER — Other Ambulatory Visit: Payer: Self-pay | Admitting: Family Medicine

## 2021-05-18 DIAGNOSIS — F5101 Primary insomnia: Secondary | ICD-10-CM

## 2021-05-20 ENCOUNTER — Encounter: Payer: Self-pay | Admitting: Family Medicine

## 2021-05-20 ENCOUNTER — Other Ambulatory Visit: Payer: Self-pay

## 2021-05-20 ENCOUNTER — Ambulatory Visit: Payer: Commercial Managed Care - PPO | Admitting: Family Medicine

## 2021-05-20 VITALS — BP 126/90 | HR 60 | Temp 96.8°F | Ht 66.0 in | Wt 253.8 lb

## 2021-05-20 DIAGNOSIS — F5101 Primary insomnia: Secondary | ICD-10-CM

## 2021-05-20 DIAGNOSIS — J309 Allergic rhinitis, unspecified: Secondary | ICD-10-CM | POA: Insufficient documentation

## 2021-05-20 DIAGNOSIS — D259 Leiomyoma of uterus, unspecified: Secondary | ICD-10-CM | POA: Insufficient documentation

## 2021-05-20 DIAGNOSIS — F17209 Nicotine dependence, unspecified, with unspecified nicotine-induced disorders: Secondary | ICD-10-CM | POA: Insufficient documentation

## 2021-05-20 DIAGNOSIS — J45909 Unspecified asthma, uncomplicated: Secondary | ICD-10-CM | POA: Insufficient documentation

## 2021-05-20 DIAGNOSIS — Z716 Tobacco abuse counseling: Secondary | ICD-10-CM | POA: Diagnosis not present

## 2021-05-20 DIAGNOSIS — I10 Essential (primary) hypertension: Secondary | ICD-10-CM

## 2021-05-20 MED ORDER — ZOLPIDEM TARTRATE 10 MG PO TABS: ORAL_TABLET | ORAL | 2 refills | Status: DC

## 2021-05-20 NOTE — Progress Notes (Signed)
[facility] [address] [place] [address] Dept: [phone] Dept Fax: [phone]  Office Visit  Subjective:    Patient ID: [name], female    DOB: 1976-04-13, 45 y.o..   MRN: [id number]  Chief Complaint  Patient presents with   Follow-up    Med recheck/refill    History of Present Illness:  Patient is in today for for reassessment of her insomnia treatment. MS. Schweinsberg has been a patient of Dr. Bryan Lemma, who has left practice. She has an appointment inOct. to establish care with me.  Ms. Roberds has a history of insomnia. She has been managed on Ambien daily or several years. She denies any medication side effects or after effects. She finds she does need to take this earlier int he evening for it to be effective.  Ms. Smithee has a history of hypertension. She is managed on amlodipine, Hyzaar, and carvedilol. She notes it has been difficult to control her BP. She does smoke 1/2 ppd of cigarettes and is on bupropion for this. She seems uncertain if she will ever quit. She also notes she has some gingival hyperplasia that her dentist blames ont he amlodipine.  Past Medical History: Patient Active Problem List   Diagnosis Date Noted   Uterine leiomyoma 05/20/2021   Primary insomnia 05/20/2021   Allergic rhinitis 05/20/2021   Asthma 05/20/2021   Tobacco use disorder, continuous 05/20/2021   Essential hypertension 09/14/2018   Past Surgical History:  Procedure Laterality Date   SINUS EXPLORATION     TONSILLECTOMY     History reviewed. No pertinent family history.  Outpatient Medications Prior to Visit  Medication Sig Dispense Refill   albuterol (PROAIR HFA) 108 (90 Base) MCG/ACT inhaler Inhale 2 puffs into the lungs every 4 (four) hours as needed for wheezing or shortness of breath. 1 each 2   amLODipine (NORVASC) 10 MG tablet Take 1 tablet (10 mg total) by mouth daily. 90 tablet 3   buPROPion  (WELLBUTRIN SR) 150 MG 12 hr tablet Take 1 tablet (150 mg total) by mouth 2 (two) times daily. 180 tablet 3   carvedilol (COREG) 25 MG tablet TAKE 1 TABLET BY MOUTH 2 TIMES DAILY WITH A MEAL. 180 tablet 3   fluticasone (FLONASE) 50 MCG/ACT nasal spray Place 2 sprays into both nostrils daily. 16 g 0   losartan-hydrochlorothiazide (HYZAAR) 100-25 MG tablet TAKE 1 TABLET BY MOUTH EVERY DAY 30 tablet 1   zolpidem (AMBIEN) 10 MG tablet TAKE 1 TABLET BY MOUTH EVERYDAY AT BEDTIME 30 tablet 0   amoxicillin-clavulanate (AUGMENTIN) 875-125 MG tablet Take 1 tablet by mouth 2 (two) times daily. 20 tablet 0   No facility-administered medications prior to visit.   No Known Allergies    Objective:   Today's Vitals   05/20/21 0816  BP: 126/90  Pulse: 60  Temp: (!) 96.8 F (36 C)  TempSrc: Temporal  SpO2: 98%  Weight: 253 lb 12.8 oz (115.1 kg)  Height: '5\' 6"'$  (1.676 m)   Body mass index is 40.96 kg/m.   General: Well developed, well nourished. No acute distress. Psych: Alert and oriented. Normal mood and affect.  Health Maintenance Due  Topic Date Due   Pneumococcal Vaccine 16-16 Years old (1 - PCV) Never done   Hepatitis C Screening  Never done   PAP SMEAR-Modifier  Never done   INFLUENZA VACCINE  05/04/2021     Assessment & Plan:   1. Primary insomnia I did discuss potential  risks of long term use of Ambien. I advised that CBT has more evidence for efficacy and encouraged her to consider this. I will continue the Ambien for now.  - zolpidem (AMBIEN) 10 MG tablet; TAKE 1 TABLET BY MOUTH EVERYDAY AT BEDTIME  Dispense: 30 tablet; Refill: 2  2. Essential hypertension Diastolic blood pressure remains a little high. Gingival hyperplasia occurs in <1% of patients using amlodipine, but would consider if we need to change her medication.  3. Encounter for tobacco use cessation counseling I advised Ms. Bain to quit, particularly in light of her ongoing issues with hypertension. We will  continue to address at future visits.  Haydee Salter, MD

## 2021-07-24 ENCOUNTER — Encounter: Payer: Commercial Managed Care - PPO | Admitting: Family Medicine

## 2021-09-15 ENCOUNTER — Ambulatory Visit: Payer: Commercial Managed Care - PPO | Admitting: Nurse Practitioner

## 2021-09-15 ENCOUNTER — Encounter: Payer: Self-pay | Admitting: Nurse Practitioner

## 2021-09-15 ENCOUNTER — Other Ambulatory Visit: Payer: Self-pay

## 2021-09-15 VITALS — BP 140/90 | HR 64 | Temp 97.6°F | Ht 67.0 in | Wt 258.0 lb

## 2021-09-15 DIAGNOSIS — F17219 Nicotine dependence, cigarettes, with unspecified nicotine-induced disorders: Secondary | ICD-10-CM

## 2021-09-15 DIAGNOSIS — F5101 Primary insomnia: Secondary | ICD-10-CM | POA: Diagnosis not present

## 2021-09-15 DIAGNOSIS — Z23 Encounter for immunization: Secondary | ICD-10-CM | POA: Diagnosis not present

## 2021-09-15 DIAGNOSIS — I10 Essential (primary) hypertension: Secondary | ICD-10-CM

## 2021-09-15 DIAGNOSIS — K061 Gingival enlargement: Secondary | ICD-10-CM

## 2021-09-15 DIAGNOSIS — Z6841 Body Mass Index (BMI) 40.0 and over, adult: Secondary | ICD-10-CM

## 2021-09-15 DIAGNOSIS — T50905A Adverse effect of unspecified drugs, medicaments and biological substances, initial encounter: Secondary | ICD-10-CM

## 2021-09-15 MED ORDER — NIFEDIPINE ER OSMOTIC RELEASE 30 MG PO TB24: 30.0000 mg | ORAL_TABLET | Freq: Every day | ORAL | 0 refills | Status: DC

## 2021-09-15 MED ORDER — ZOLPIDEM TARTRATE 10 MG PO TABS: ORAL_TABLET | ORAL | 2 refills | Status: DC

## 2021-09-15 NOTE — Patient Instructions (Addendum)
Return on Tuesday, December 20th, 2022 for fasting labs Stop Amlodipine Begin Procardia 30 mg XL Notify office immediately of any adverse reactions Monitor BP at home, keep log Follow-up in 2 weeks    Preventive Care 50-43 Years Old, Female Preventive care refers to lifestyle choices and visits with your health care provider that can promote health and wellness. Preventive care visits are also called wellness exams. What can I expect for my preventive care visit? Counseling Your health care provider may ask you questions about your: Medical history, including: Past medical problems. Family medical history. Pregnancy history. Current health, including: Menstrual cycle. Method of birth control. Emotional well-being. Home life and relationship well-being. Sexual activity and sexual health. Lifestyle, including: Alcohol, nicotine or tobacco, and drug use. Access to firearms. Diet, exercise, and sleep habits. Work and work Statistician. Sunscreen use. Safety issues such as seatbelt and bike helmet use. Physical exam Your health care provider will check your: Height and weight. These may be used to calculate your BMI (body mass index). BMI is a measurement that tells if you are at a healthy weight. Waist circumference. This measures the distance around your waistline. This measurement also tells if you are at a healthy weight and may help predict your risk of certain diseases, such as type 2 diabetes and high blood pressure. Heart rate and blood pressure. Body temperature. Skin for abnormal spots. What immunizations do I need? Vaccines are usually given at various ages, according to a schedule. Your health care provider will recommend vaccines for you based on your age, medical history, and lifestyle or other factors, such as travel or where you work. What tests do I need? Screening Your health care provider may recommend screening tests for certain conditions. This may  include: Lipid and cholesterol levels. Diabetes screening. This is done by checking your blood sugar (glucose) after you have not eaten for a while (fasting). Pelvic exam and Pap test. Hepatitis B test. Hepatitis C test. HIV (human immunodeficiency virus) test. STI (sexually transmitted infection) testing, if you are at risk. Lung cancer screening. Colorectal cancer screening. Mammogram. Talk with your health care provider about when you should start having regular mammograms. This may depend on whether you have a family history of breast cancer. BRCA-related cancer screening. This may be done if you have a family history of breast, ovarian, tubal, or peritoneal cancers. Bone density scan. This is done to screen for osteoporosis. Talk with your health care provider about your test results, treatment options, and if necessary, the need for more tests. Follow these instructions at home: Eating and drinking  Eat a diet that includes fresh fruits and vegetables, whole grains, lean protein, and low-fat dairy products. Take vitamin and mineral supplements as recommended by your health care provider. Do not drink alcohol if: Your health care provider tells you not to drink. You are pregnant, may be pregnant, or are planning to become pregnant. If you drink alcohol: Limit how much you have to 0-1 drink a day. Know how much alcohol is in your drink. In the [place] one drink equals one 12 oz bottle of beer (355 mL), one 5 oz glass of wine (148 mL), or one 1 oz glass of hard liquor (44 mL). Lifestyle Brush your teeth every morning and night with fluoride toothpaste. Floss one time each day. Exercise for at least 30 minutes 5 or more days each week. Do not use any products that contain nicotine or tobacco. These products include cigarettes, chewing tobacco, and vaping  devices, such as e-cigarettes. If you need help quitting, ask your health care provider. Do not use drugs. If you are sexually  active, practice safe sex. Use a condom or other form of protection to prevent STIs. If you do not wish to become pregnant, use a form of birth control. If you plan to become pregnant, see your health care provider for a prepregnancy visit. Take aspirin only as told by your health care provider. Make sure that you understand how much to take and what form to take. Work with your health care provider to find out whether it is safe and beneficial for you to take aspirin daily. Find healthy ways to manage stress, such as: Meditation, yoga, or listening to music. Journaling. Talking to a trusted person. Spending time with friends and family. Minimize exposure to UV radiation to reduce your risk of skin cancer. Safety Always wear your seat belt while driving or riding in a vehicle. Do not drive: If you have been drinking alcohol. Do not ride with someone who has been drinking. When you are tired or distracted. While texting. If you have been using any mind-altering substances or drugs. Wear a helmet and other protective equipment during sports activities. If you have firearms in your house, make sure you follow all gun safety procedures. Seek help if you have been physically or sexually abused. What's next? Visit your health care provider once a year for an annual wellness visit. Ask your health care provider how often you should have your eyes and teeth checked. Stay up to date on all vaccines. This information is not intended to replace advice given to you by your health care provider. Make sure you discuss any questions you have with your health care provider. Document Revised: 03/18/2021 Document Reviewed: 03/18/2021 Elsevier Patient Education  2022 [place] your health care provider which exercises are safe for you. Do exercises exactly as told by your health care provider and adjust them as directed. It is normal to feel mild stretching, pulling, tightness, or  discomfort as you do these exercises. Stop right away if you feel sudden pain or your pain gets worse. Do not begin these exercises until told by your health care provider. Stretching and range-of-motion exercises These exercises warm up your muscles and joints and improve the movement and flexibility of your hips and back. These exercises also help to relieve pain, numbness, and tingling. Sciatic nerve glide Sit in a chair with your head facing down toward your chest. Place your hands behind your back. Let your shoulders slump forward. Slowly straighten one of your legs while you tilt your head back as if you are looking toward the ceiling. Only straighten your leg as far as you can without making your symptoms worse. Hold this position for __________ seconds. Slowly return to the starting position. Repeat with your other leg. Repeat __________ times. Complete this exercise __________ times a day. Knee to chest with hip adduction and internal rotation  Lie on your back on a firm surface with both legs straight. Bend one of your knees and move it up toward your chest until you feel a gentle stretch in your lower back and buttock. Then, move your knee toward the shoulder that is on the opposite side from your leg. This is hip adduction and internal rotation. Hold your leg in this position by holding on to the front of your knee. Hold this position for __________ seconds. Slowly return to the starting position. Repeat  with your other leg. Repeat __________ times. Complete this exercise __________ times a day. Prone extension on elbows  Lie on your abdomen on a firm surface. A bed may be too soft for this exercise. Prop yourself up on your elbows. Use your arms to help lift your chest up until you feel a gentle stretch in your abdomen and your lower back. This will place some of your body weight on your elbows. If this is uncomfortable, try stacking pillows under your chest. Your hips should  stay down, against the surface that you are lying on. Keep your hip and back muscles relaxed. Hold this position for __________ seconds. Slowly relax your upper body and return to the starting position. Repeat __________ times. Complete this exercise __________ times a day. Strengthening exercises These exercises build strength and endurance in your back. Endurance is the ability to use your muscles for a long time, even after they get tired. Pelvic tilt This exercise strengthens the muscles that lie deep in the abdomen. Lie on your back on a firm surface. Bend your knees and keep your feet flat on the floor. Tense your abdominal muscles. Tip your pelvis up toward the ceiling and flatten your lower back into the floor. To help with this exercise, you may place a small towel under your lower back and try to push your back into the towel. Hold this position for __________ seconds. Let your muscles relax completely before you repeat this exercise. Repeat __________ times. Complete this exercise __________ times a day. Alternating arm and leg raises  Get on your hands and knees on a firm surface. If you are on a hard floor, you may want to use padding, such as an exercise mat, to cushion your knees. Line up your arms and legs. Your hands should be directly below your shoulders, and your knees should be directly below your hips. Lift your left leg behind you. At the same time, raise your right arm and straighten it in front of you. Do not lift your leg higher than your hip. Do not lift your arm higher than your shoulder. Keep your abdominal and back muscles tight. Keep your hips facing the ground. Do not arch your back. Keep your balance carefully, and do not hold your breath. Hold this position for __________ seconds. Slowly return to the starting position. Repeat with your right leg and your left arm. Repeat __________ times. Complete this exercise __________ times a day. Posture and body  mechanics Good posture and healthy body mechanics can help to relieve stress in your body's tissues and joints. Body mechanics refers to the movements and positions of your body while you do your daily activities. Posture is part of body mechanics. Good posture means: Your spine is in its natural S-curve position (neutral). Your shoulders are pulled back slightly. Your head is not tipped forward. Follow these guidelines to improve your posture and body mechanics in your everyday activities. Standing  When standing, keep your spine neutral and your feet about hip width apart. Keep a slight bend in your knees. Your ears, shoulders, and hips should line up. When you do a task in which you stand in one place for a long time, place one foot up on a stable object that is 2-4 inches (5-10 cm) high, such as a footstool. This helps keep your spine neutral. Sitting  When sitting, keep your spine neutral and keep your feet flat on the floor. Use a footrest, if necessary, and keep your thighs  parallel to the floor. Avoid rounding your shoulders, and avoid tilting your head forward. When working at a desk or a computer, keep your desk at a height where your hands are slightly lower than your elbows. Slide your chair under your desk so you are close enough to maintain good posture. When working at a computer, place your monitor at a height where you are looking straight ahead and you do not have to tilt your head forward or downward to look at the screen. Resting When lying down and resting, avoid positions that are most painful for you. If you have pain with activities such as sitting, bending, stooping, or squatting, lie in a position in which your body does not bend very much. For example, avoid curling up on your side with your arms and knees near your chest (fetal position). If you have pain with activities such as standing for a long time or reaching with your arms, lie with your spine in a neutral  position and bend your knees slightly. Try the following positions: Lying on your side with a pillow between your knees. Lying on your back with a pillow under your knees. Lifting  When lifting objects, keep your feet at least shoulder width apart and tighten your abdominal muscles. Bend your knees and hips and keep your spine neutral. It is important to lift using the strength of your legs, not your back. Do not lock your knees straight out. Always ask for help to lift heavy or awkward objects. This information is not intended to replace advice given to you by your health care provider. Make sure you discuss any questions you have with your health care provider. Document Revised: 01/12/2019 Document Reviewed: 10/12/2018 Elsevier Patient Education  [place].

## 2021-09-15 NOTE — Progress Notes (Signed)
New Patient Office Visit  Subjective:  Patient ID: Amanda Barrett, female    DOB: 1976-03-19  Age: 45 y.o. MRN: [id number]  CC:  Chief Complaint  Patient presents with   Establish Care    HPI Carren Luticia Tadros is a 45 year old Caucasian female that presents to establish care. Has a medical history  of HTN, Insomnia, Depression. She is due for a pap smear, she will contact her ob/gyn to schedule. Normal mammogram Feb 2022, she will schedule with [facility] in Feb 2023. She has obtained COVID-19 immunizations and boosters. She received flu vaccine today. She does not wear corrective lenses, had lasik eye surgery. She is a cigarette smoker.    Hypertension: She was last seen for hypertension 4 months ago.  Management includes Amlodipine 10 mg daily, Coreg 25 BID, Hyzaar 100-25 mg daily.  She reports excellent compliance with treatment. She is having side effects. Gingival hyperplasia due to Amlodipine. States she would like to try alternative antihypertensive medication. She is following a Regular diet. She is not exercising. She does smoke.  Use of agents associated with hypertension: none.   Symptoms: No chest pain No chest pressure  No palpitations No syncope  No dyspnea No orthopnea  No paroxysmal nocturnal dyspnea No lower extremity edema   Pertinent labs: Lab Results  Component Value Date   CHOL 174 10/18/2018   HDL 64.70 10/18/2018   LDLCALC 95 10/18/2018   TRIG 70.0 10/18/2018   CHOLHDL 3 10/18/2018   Lab Results  Component Value Date   NA 139 11/06/2018   K 3.4 (L) 11/06/2018   CREATININE 0.92 11/06/2018   GLUCOSE 116 (H) 11/06/2018       Depression, Follow-up  She  was last seen for this 4 months ago. Prescribed  Wellbutrin 150 mg BID in 2014 after losing her mother,  off-label to decrease nicotine cravings, and weight loss.  She reports excellent compliance with treatment. She is not having side effects.          She reports  excellent tolerance of treatment.         Current symptoms include: fatigue  Depression screen [facility] 2/9 09/15/2021 09/15/2021 05/20/2021  Decreased Interest 0 0 0  Down, Depressed, Hopeless 0 0 0  PHQ - 2 Score 0 0 0  Altered sleeping 3 - -  Tired, decreased energy 0 - -  Change in appetite 0 - -  Feeling bad or failure about yourself  0 - -  Trouble concentrating 0 - -  Moving slowly or fidgety/restless 0 - -  Suicidal thoughts 0 - -  PHQ-9 Score 3 - -  Difficult doing work/chores Not difficult at all - -      Past Medical History:  Diagnosis Date   Anemia    Pernicious   Hypertension     Past Surgical History:  Procedure Laterality Date   MYOMECTOMY     Uterine Fibroids   SINUS EXPLORATION     TONSILLECTOMY      Family History  Problem Relation Age of Onset   Stroke Mother    Hypertension Mother    Diabetes Mother    Heart disease Mother    Hypertension Father    Diabetes Father    Diabetes Paternal Grandfather    Other Paternal Grandfather        ITP   Neuropathy Paternal Grandfather     Social History   Socioeconomic History   Marital status: Married    Spouse  name: Gus Viverette   Number of children: Not on file   Years of education: Not on file   Highest education level: Not on file  Occupational History   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  Tobacco Use   Smoking status: Every Day    Packs/day: 0.25    Types: Cigarettes   Smokeless tobacco: Never  Substance and Sexual Activity   Alcohol use: No   Drug use: No   Sexual activity: Yes    Partners: Male    Birth control/protection: None  Other Topics Concern   Not on file  Social History Narrative   Not on file   Social Determinants of Health   Financial Resource Strain: Low Risk    Difficulty of Paying Living Expenses: Not hard at all  Food Insecurity: No Food Insecurity   Worried About Charity fundraiser in the Last Year: Never true   [place] in the  Last Year: Never true  Transportation Needs: No Transportation Needs   Lack of Transportation (Medical): No   Lack of Transportation (Non-Medical): No  Physical Activity: Insufficiently Active   Days of Exercise per Week: 2 days   Minutes of Exercise per Session: 20 min  Stress: No Stress Concern Present   Feeling of Stress : Not at all  Social Connections: Moderately Isolated   Frequency of Communication with Friends and Family: More than three times a week   Frequency of Social Gatherings with Friends and Family: Three times a week   Attends Religious Services: Never   Active Member of Clubs or Organizations: No   Attends Archivist Meetings: Never   Marital Status: Married  Human resources officer Violence: Not At Risk   Fear of Current or Ex-Partner: No   Emotionally Abused: No   Physically Abused: No   Sexually Abused: No    ROS Review of Systems  Constitutional:  Positive for fatigue. Negative for chills and fever.  HENT:  Negative for congestion, ear pain, rhinorrhea and sore throat.   Eyes: Negative.   Respiratory:  Negative for cough and shortness of breath.   Cardiovascular:  Negative for chest pain.  Gastrointestinal:  Negative for abdominal pain, constipation, diarrhea, nausea and vomiting.  Endocrine: Negative.   Genitourinary:  Negative for dysuria and urgency.  Musculoskeletal:  Positive for back pain (intermittent sciatica). Negative for myalgias.  Skin: Negative.   Allergic/Immunologic: Positive for environmental allergies.  Neurological:  Negative for dizziness, weakness, light-headedness and headaches.  Psychiatric/Behavioral:  Positive for sleep disturbance. Negative for dysphoric mood. The patient is not nervous/anxious.        Depression and insomnia well controlled with medication   Objective:   Today's Vitals: Pulse 64    Temp 97.6 F (36.4 C)    Ht 5\' 7"  (1.702 m)    Wt 258 lb (117 kg)    LMP 08/24/2021    SpO2 99%    BMI 40.41 kg/m  BP 140/90     Pulse 64    Temp 97.6 F (36.4 C)    Ht 5\' 7"  (1.702 m)    Wt 258 lb (117 kg)    LMP 08/24/2021    SpO2 99%    BMI 40.41 kg/m    Physical Exam Vitals reviewed.  Constitutional:      Appearance: Normal appearance.  HENT:     Head: Normocephalic.     Right Ear: Tympanic membrane normal.     Left Ear:  Tympanic membrane normal.     Nose: Nose normal.     Mouth/Throat:     Mouth: Mucous membranes are moist.     Dentition: Gingival swelling (gingival hyperplasia on alveolar ridge) present.     Pharynx: Oropharynx is clear.   Eyes:     Pupils: Pupils are equal, round, and reactive to light.  Cardiovascular:     Rate and Rhythm: Normal rate and regular rhythm.     Pulses: Normal pulses.     Heart sounds: Normal heart sounds.  Pulmonary:     Effort: Pulmonary effort is normal.     Breath sounds: Normal breath sounds.  Abdominal:     General: Bowel sounds are normal.     Palpations: Abdomen is soft.  Musculoskeletal:        General: Normal range of motion.     Cervical back: Neck supple.  Skin:    General: Skin is warm and dry.     Capillary Refill: Capillary refill takes less than 2 seconds.  Neurological:     General: No focal deficit present.     Mental Status: She is alert and oriented to person, place, and time.  Psychiatric:        Mood and Affect: Mood normal.        Behavior: Behavior normal.    Assessment & Plan:   1. Essential hypertension-not at goal - CBC with Differential/Platelet; Future - Comprehensive metabolic panel; Future - Lipid panel; Future - TSH; Future - VITAMIN D 25 Hydroxy (Vit-D Deficiency, Fractures); Future - NIFEdipine (PROCARDIA XL) 30 MG 24 hr tablet; Take 1 tablet (30 mg total) by mouth daily.  Dispense: 30 tablet; Refill: 0 -stop Amlodipine  2. Primary insomnia-well controlled - zolpidem (AMBIEN) 10 MG tablet; TAKE 1 TABLET BY MOUTH EVERYDAY AT BEDTIME  Dispense: 30 tablet; Refill: 2  3. Need for immunization against influenza - Flu  Vaccine QUAD 11mo+IM (Fluarix, Fluzone & Alfiuria Quad PF)  4. BMI 40.0-44.9, adult (HCC) -heart healthy diet -increase physical activity  5. Cigarette nicotine dependence with nicotine-induced disorder -recommend smoking cessation  6. Drug-induced gingival hyperplasia  -discontinue Amlodipine  Return on Tuesday, December 20th, 2022 for fasting labs Stop Amlodipine Begin Procardia 30 mg XL Notify office immediately of any adverse reactions Monitor BP at home, keep log Follow-up in 2 weeks  Outpatient Encounter Medications as of 09/15/2021  Medication Sig   albuterol (PROAIR HFA) 108 (90 Base) MCG/ACT inhaler Inhale 2 puffs into the lungs every 4 (four) hours as needed for wheezing or shortness of breath.   amLODipine (NORVASC) 10 MG tablet Take 1 tablet (10 mg total) by mouth daily.   buPROPion (WELLBUTRIN SR) 150 MG 12 hr tablet Take 1 tablet (150 mg total) by mouth 2 (two) times daily.   carvedilol (COREG) 25 MG tablet TAKE 1 TABLET BY MOUTH 2 TIMES DAILY WITH A MEAL.   fluticasone (FLONASE) 50 MCG/ACT nasal spray Place 2 sprays into both nostrils daily.   losartan-hydrochlorothiazide (HYZAAR) 100-25 MG tablet TAKE 1 TABLET BY MOUTH EVERY DAY   zolpidem (AMBIEN) 10 MG tablet TAKE 1 TABLET BY MOUTH EVERYDAY AT BEDTIME   No facility-administered encounter medications on file as of 09/15/2021.    Follow-up: 2-weeks   I, Rip Harbour, NP, have reviewed all documentation for this visit. The documentation on 09/15/21 for the exam, diagnosis, procedures, and orders are all accurate and complete.    Signed, Jerrell Belfast, DNP 09/15/21 at 3:20 PM

## 2021-09-16 ENCOUNTER — Other Ambulatory Visit: Payer: Self-pay | Admitting: Nurse Practitioner

## 2021-09-16 DIAGNOSIS — Z1231 Encounter for screening mammogram for malignant neoplasm of breast: Secondary | ICD-10-CM

## 2021-09-22 ENCOUNTER — Other Ambulatory Visit: Payer: Commercial Managed Care - PPO

## 2021-09-22 DIAGNOSIS — I10 Essential (primary) hypertension: Secondary | ICD-10-CM

## 2021-09-23 LAB — LIPID PANEL
Chol/HDL Ratio: 2.9 ratio (ref 0.0–4.4)
Cholesterol, Total: 174 mg/dL (ref 100–199)
HDL: 61 mg/dL (ref >39)
LDL Chol Calc (NIH): 97 mg/dL (ref 0–99)
Triglycerides: 87 mg/dL (ref 0–149)
VLDL Cholesterol Cal: 16 mg/dL (ref 5–40)

## 2021-09-23 LAB — CBC WITH DIFF/PLATELET
Basophils Absolute: 0.1 10*3/uL (ref 0.0–0.2)
Basos: 1 %
EOS (ABSOLUTE): 0.3 10*3/uL (ref 0.0–0.4)
Eos: 4 %
Hematocrit: 43.1 % (ref 34.0–46.6)
Hemoglobin: 14.7 g/dL (ref 11.1–15.9)
Immature Grans (Abs): 0 10*3/uL (ref 0.0–0.1)
Immature Granulocytes: 0 %
Lymphocytes Absolute: 2 10*3/uL (ref 0.7–3.1)
Lymphs: 32 %
MCH: 28.9 pg (ref 26.6–33.0)
MCHC: 34.1 g/dL (ref 31.5–35.7)
MCV: 85 fL (ref 79–97)
Monocytes Absolute: 0.6 10*3/uL (ref 0.1–0.9)
Monocytes: 9 %
Neutrophils Absolute: 3.5 10*3/uL (ref 1.4–7.0)
Neutrophils: 54 %
Platelets: 294 10*3/uL (ref 150–450)
RBC: 5.08 x10E6/uL (ref 3.77–5.28)
RDW: 13.7 % (ref 11.7–15.4)
WBC: 6.4 10*3/uL (ref 3.4–10.8)

## 2021-09-23 LAB — COMPREHENSIVE METABOLIC PANEL
ALT: 40 IU/L — ABNORMAL HIGH (ref 0–32)
AST: 26 IU/L (ref 0–40)
Albumin/Globulin Ratio: 1.9 (ref 1.2–2.2)
Albumin: 4.5 g/dL (ref 3.8–4.8)
Alkaline Phosphatase: 67 IU/L (ref 44–121)
BUN/Creatinine Ratio: 16 (ref 9–23)
BUN: 15 mg/dL (ref 6–24)
Bilirubin Total: 0.4 mg/dL (ref 0.0–1.2)
CO2: 25 mmol/L (ref 20–29)
Calcium: 9.6 mg/dL (ref 8.7–10.2)
Chloride: 99 mmol/L (ref 96–106)
Creatinine, Ser: 0.91 mg/dL (ref 0.57–1.00)
Globulin, Total: 2.4 g/dL (ref 1.5–4.5)
Glucose: 101 mg/dL — ABNORMAL HIGH (ref 70–99)
Potassium: 3.3 mmol/L — ABNORMAL LOW (ref 3.5–5.2)
Sodium: 141 mmol/L (ref 134–144)
Total Protein: 6.9 g/dL (ref 6.0–8.5)
eGFR: 80 mL/min/{1.73_m2} (ref >59)

## 2021-09-23 LAB — CARDIOVASCULAR RISK ASSESSMENT

## 2021-09-23 LAB — TSH: TSH: 2.99 u[IU]/mL (ref 0.450–4.500)

## 2021-09-23 LAB — VITAMIN D 25 HYDROXY (VIT D DEFICIENCY, FRACTURES): Vit D, 25-Hydroxy: 17.2 ng/mL — ABNORMAL LOW (ref 30.0–100.0)

## 2021-09-29 ENCOUNTER — Other Ambulatory Visit: Payer: Self-pay | Admitting: Nurse Practitioner

## 2021-09-29 DIAGNOSIS — E876 Hypokalemia: Secondary | ICD-10-CM

## 2021-09-29 DIAGNOSIS — R7301 Impaired fasting glucose: Secondary | ICD-10-CM

## 2021-09-29 MED ORDER — POTASSIUM CHLORIDE CRYS ER 10 MEQ PO TBCR: 10.0000 meq | EXTENDED_RELEASE_TABLET | Freq: Two times a day (BID) | ORAL | 3 refills | Status: DC

## 2021-09-30 ENCOUNTER — Other Ambulatory Visit: Payer: Self-pay

## 2021-09-30 MED ORDER — VITAMIN D (ERGOCALCIFEROL) 1.25 MG (50000 UNIT) PO CAPS: 50000.0000 [IU] | ORAL_CAPSULE | ORAL | 0 refills | Status: DC

## 2021-10-06 ENCOUNTER — Ambulatory Visit: Payer: Commercial Managed Care - PPO | Admitting: Nurse Practitioner

## 2021-10-06 ENCOUNTER — Other Ambulatory Visit: Payer: Self-pay

## 2021-10-06 ENCOUNTER — Encounter: Payer: Self-pay | Admitting: Nurse Practitioner

## 2021-10-06 VITALS — BP 152/104 | HR 64 | Temp 97.2°F | Ht 68.0 in | Wt 260.0 lb

## 2021-10-06 DIAGNOSIS — Z833 Family history of diabetes mellitus: Secondary | ICD-10-CM

## 2021-10-06 DIAGNOSIS — R7301 Impaired fasting glucose: Secondary | ICD-10-CM | POA: Diagnosis not present

## 2021-10-06 DIAGNOSIS — I10 Essential (primary) hypertension: Secondary | ICD-10-CM | POA: Diagnosis not present

## 2021-10-06 DIAGNOSIS — E876 Hypokalemia: Secondary | ICD-10-CM | POA: Diagnosis not present

## 2021-10-06 MED ORDER — VALSARTAN 160 MG PO TABS: 160.0000 mg | ORAL_TABLET | Freq: Every day | ORAL | 3 refills | Status: DC

## 2021-10-06 NOTE — Progress Notes (Signed)
Subjective:  Patient ID: Amanda Barrett, female    DOB: [birth year]  Age: 46 y.o. MRN: [id number]  Chief Complaint  Patient presents with   Hypertension    HPI  Amanda Barrett is a 46 year old Caucasian female that presents for follow-up of hypertension. BP not well-controlled per BP log. She was found to have hypokalemia per labs. She was prescribed K-Clor 10 meq BID but did not receive medication due to miscommunication. States she will pick up potassium supplement today. She was also found to have vitamin d deficiency. She has begun vitamin D rich diet and vitamin D 50, 0000 U weekly. Amanda Barrett had elevated blood glucose per labs on 09/22/21. She has a family history of type 1 and 2 diabetes with parents.   Hypertension, follow-up: She was last seen for hypertension 3 weeks ago.  BP at that visit was 140/90. Management since that visit includes HYZAAR 100-25.  She reports excellent compliance with treatment. She is not having side effects.  She is following a Regular diet. She is not exercising. She does smoke.  Use of agents associated with hypertension: none.   Outside blood pressures are 131/78, 140/87, 167/87 No chest pain No chest pressure  No palpitations No syncope  No dyspnea No orthopnea  No paroxysmal nocturnal dyspnea No lower extremity edema   Pertinent labs: Lab Results  Component Value Date   CHOL 174 09/22/2021   HDL 61 09/22/2021   LDLCALC 97 09/22/2021   TRIG 87 09/22/2021   CHOLHDL 2.9 09/22/2021   Lab Results  Component Value Date   NA 141 09/22/2021   K 3.3 (L) 09/22/2021   CREATININE 0.91 09/22/2021   EGFR 80 09/22/2021   GLUCOSE 101 (H) 09/22/2021     The 10-year ASCVD risk score (Arnett DK, et al., 2019) is: 2.7%   Current Outpatient Medications on File Prior to Visit  Medication Sig Dispense Refill   albuterol (PROAIR HFA) 108 (90 Base) MCG/ACT inhaler Inhale 2 puffs into the lungs every 4 (four) hours as needed for wheezing or shortness of  breath. 1 each 2   buPROPion (WELLBUTRIN SR) 150 MG 12 hr tablet Take 1 tablet (150 mg total) by mouth 2 (two) times daily. 180 tablet 3   carvedilol (COREG) 25 MG tablet TAKE 1 TABLET BY MOUTH 2 TIMES DAILY WITH A MEAL. 180 tablet 3   fluticasone (FLONASE) 50 MCG/ACT nasal spray Place 2 sprays into both nostrils daily. 16 g 0   losartan-hydrochlorothiazide (HYZAAR) 100-25 MG tablet TAKE 1 TABLET BY MOUTH EVERY DAY 30 tablet 1   NIFEdipine (PROCARDIA XL) 30 MG 24 hr tablet Take 1 tablet (30 mg total) by mouth daily. 30 tablet 0   potassium chloride SA (KLOR-CON M) 10 MEQ tablet Take 1 tablet (10 mEq total) by mouth 2 (two) times daily. 60 tablet 3   Vitamin D, Ergocalciferol, (DRISDOL) 1.25 MG (50000 UNIT) CAPS capsule Take 1 capsule (50,000 Units total) by mouth every 7 (seven) days. 12 capsule 0   zolpidem (AMBIEN) 10 MG tablet TAKE 1 TABLET BY MOUTH EVERYDAY AT BEDTIME 30 tablet 2   No current facility-administered medications on file prior to visit.   Past Medical History:  Diagnosis Date   Anemia    Pernicious   Hypertension    Past Surgical History:  Procedure Laterality Date   MYOMECTOMY     Uterine Fibroids   SINUS EXPLORATION     TONSILLECTOMY      Family History  Problem Relation Age of  Onset   Stroke Mother    Hypertension Mother    Diabetes Mother        type 1   Heart disease Mother    Hypertension Father    Diabetes Father        Type 2   Cancer Maternal Grandmother        Ovarian   Heart disease Maternal Grandmother    Hypertension Maternal Grandmother    Diabetes Maternal Grandmother        Type 1   Kidney disease Maternal Grandmother    Diabetes Maternal Grandfather        Type 2   Other Maternal Grandfather        ITP   Hypotension Maternal Grandfather    Diabetes Paternal Grandmother        Type 2   Hypertension Paternal Grandmother    Cancer Paternal Grandfather        Bladder   Hypertension Paternal Grandfather    Social History    Socioeconomic History   Marital status: Married    Spouse name: Gus Designer, industrial/product   Number of children: Not on file   Years of education: Not on file   Highest education level: Not on file  Occupational History   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  Tobacco Use   Smoking status: Every Day    Packs/day: 0.25    Types: Cigarettes   Smokeless tobacco: Never  Substance and Sexual Activity   Alcohol use: No   Drug use: No   Sexual activity: Yes    Partners: Male    Birth control/protection: None  Other Topics Concern   Not on file  Social History Narrative   Not on file   Social Determinants of Health   Financial Resource Strain: Low Risk    Difficulty of Paying Living Expenses: Not hard at all  Food Insecurity: No Food Insecurity   Worried About Charity fundraiser in the Last Year: Never true   [place] in the Last Year: Never true  Transportation Needs: No Transportation Needs   Lack of Transportation (Medical): No   Lack of Transportation (Non-Medical): No  Physical Activity: Insufficiently Active   Days of Exercise per Week: 2 days   Minutes of Exercise per Session: 20 min  Stress: No Stress Concern Present   Feeling of Stress : Not at all  Social Connections: Moderately Isolated   Frequency of Communication with Friends and Family: More than three times a week   Frequency of Social Gatherings with Friends and Family: Three times a week   Attends Religious Services: Never   Active Member of Clubs or Organizations: No   Attends Archivist Meetings: Never   Marital Status: Married    Review of Systems  Constitutional:  Negative for chills, fatigue and fever.  HENT:  Negative for congestion, ear pain, rhinorrhea and sore throat.   Respiratory:  Negative for cough and shortness of breath.   Cardiovascular:  Negative for chest pain.    Objective:  BP (!) 152/104    Pulse 64    Temp (!) 97.2 F (36.2 C)    Ht '5\' 8"'  (1.727  m)    Wt 260 lb (117.9 kg)    SpO2 97%    BMI 39.53 kg/m    BP/Weight 09/15/2021 05/20/2021 93/11/6710  Systolic BP 458 099 -  Diastolic BP 90 90 -  Wt. (Lbs) 258 253.8 225  BMI  40.41 40.96 36.32    Physical Exam Vitals reviewed.  Constitutional:      Appearance: She is obese.  HENT:     Head: Normocephalic.     Right Ear: Tympanic membrane normal.     Left Ear: Tympanic membrane normal.     Nose: Nose normal.     Mouth/Throat:     Mouth: Mucous membranes are moist.  Eyes:     Pupils: Pupils are equal, round, and reactive to light.  Cardiovascular:     Rate and Rhythm: Normal rate and regular rhythm.     Pulses: Normal pulses.     Heart sounds: Normal heart sounds.  Pulmonary:     Effort: Pulmonary effort is normal.     Breath sounds: Normal breath sounds.  Abdominal:     General: Bowel sounds are normal.     Palpations: Abdomen is soft.  Musculoskeletal:        General: Normal range of motion.  Skin:    General: Skin is warm and dry.     Capillary Refill: Capillary refill takes less than 2 seconds.  Neurological:     General: No focal deficit present.     Mental Status: She is alert and oriented to person, place, and time.  Psychiatric:        Mood and Affect: Mood normal.        Behavior: Behavior normal.     Lab Results  Component Value Date   WBC 6.4 09/22/2021   HGB 14.7 09/22/2021   HCT 43.1 09/22/2021   PLT 294 09/22/2021   GLUCOSE 101 (H) 09/22/2021   CHOL 174 09/22/2021   TRIG 87 09/22/2021   HDL 61 09/22/2021   LDLCALC 97 09/22/2021   ALT 40 (H) 09/22/2021   AST 26 09/22/2021   NA 141 09/22/2021   K 3.3 (L) 09/22/2021   CL 99 09/22/2021   CREATININE 0.91 09/22/2021   BUN 15 09/22/2021   CO2 25 09/22/2021   TSH 2.990 09/22/2021      Assessment & Plan:    1. Uncontrolled hypertension - valsartan (DIOVAN) 160 MG tablet; Take 1 tablet (160 mg total) by mouth daily.  Dispense: 90 tablet; Refill: 3 - Comprehensive metabolic panel;  Future -stop Hyzaar  2. Hypokalemia - Comprehensive metabolic panel; Future -K-clor 10 meq BID  3. Impaired fasting glucose - Hemoglobin A1c; Future  4. Family history of diabetes mellitus type I - Hemoglobin A1c; Future  5. Family history of diabetes mellitus type II - Hemoglobin A1c; Future     Stop Hyzaar Begin Diovan 160 mg daily  Monitor BP, keep log Take potassium twice daily  Return in 1-week for labs and BP recheck    Follow-up: 1-week  An After Visit Summary was printed and given to the patient.  I, Rip Harbour, NP, have reviewed all documentation for this visit. The documentation on 10/06/21 for the exam, diagnosis, procedures, and orders are all accurate and complete.   Signed, Rip Harbour, NP [place] [phone]

## 2021-10-06 NOTE — Patient Instructions (Addendum)
Stop Hyzaar Begin Diovan 160 mg daily  Monitor BP, keep log Take potassium twice daily  Return in 1-week for labs and BP recheck     Hypokalemia Hypokalemia means that the amount of potassium in the blood is lower than normal. Potassium is a chemical (electrolyte) that helps regulate the amount of fluid in the body. It also stimulates muscle tightening (contraction) and helps nerves work properly. Normally, most of the body's potassium is inside cells, and only a very small amount is in the blood. Because the amount in the blood is so small, minor changes to potassium levels in the blood can be life-threatening. What are the causes? This condition may be caused by: Antibiotic medicine. Diarrhea or vomiting. Taking too much of a medicine that helps you have a bowel movement (laxative) can cause diarrhea and lead to hypokalemia. Chronic kidney disease (CKD). Medicines that help the body get rid of excess fluid (diuretics). Eating disorders, such as bulimia. Low magnesium levels in the body. Sweating a lot. What are the signs or symptoms? Symptoms of this condition include: Weakness. Constipation. Fatigue. Muscle cramps. Mental confusion. Skipped heartbeats or irregular heartbeat (palpitations). Tingling or numbness. How is this diagnosed? This condition is diagnosed with a blood test. How is this treated? This condition may be treated by: Taking potassium supplements by mouth. Adjusting the medicines that you take. Eating more foods that contain a lot of potassium. If your potassium level is very low, you may need to get potassium through an IV and be monitored in the hospital. Follow these instructions at home:  Take over-the-counter and prescription medicines only as told by your health care provider. This includes vitamins and supplements. Eat a healthy diet. A healthy diet includes fresh fruits and vegetables, whole grains, healthy fats, and lean proteins. If instructed,  eat more foods that contain a lot of potassium. This includes: Nuts, such as peanuts and pistachios. Seeds, such as sunflower seeds and pumpkin seeds. Peas, lentils, and lima beans. Whole grain and bran cereals and breads. Fresh fruits and vegetables, such as apricots, avocado, bananas, cantaloupe, kiwi, oranges, tomatoes, asparagus, and potatoes. Orange juice. Tomato juice. Red meats. Yogurt. Keep all follow-up visits as told by your health care provider. This is important. Contact a health care provider if you: Have weakness that gets worse. Feel your heart pounding or racing. Vomit. Have diarrhea. Have diabetes (diabetes mellitus) and you have trouble keeping your blood sugar (glucose) in your target range. Get help right away if you: Have chest pain. Have shortness of breath. Have vomiting or diarrhea that lasts for more than 2 days. Faint. Summary Hypokalemia means that the amount of potassium in the blood is lower than normal. This condition is diagnosed with a blood test. Hypokalemia may be treated by taking potassium supplements, adjusting the medicines that you take, or eating more foods that are high in potassium. If your potassium level is very low, you may need to get potassium through an IV and be monitored in the hospital. This information is not intended to replace advice given to you by your health care provider. Make sure you discuss any questions you have with your health care provider. Document Revised: 05/02/2018 Document Reviewed: 05/03/2018 Elsevier Patient Education  2022 [place].     Prediabetes Eating Plan Prediabetes is a condition that causes blood sugar (glucose) levels to be higher than normal. This increases the risk for developing type 2 diabetes (type 2 diabetes mellitus). Working with a health care provider or  nutrition specialist (dietitian) to make diet and lifestyle changes can help prevent the onset of diabetes. These changes may help  you: Control your blood glucose levels. Improve your cholesterol levels. Manage your blood pressure. What are tips for following this plan? Reading food labels Read food labels to check the amount of fat, salt (sodium), and sugar in prepackaged foods. Avoid foods that have: Saturated fats. Trans fats. Added sugars. Avoid foods that have more than 300 milligrams (mg) of sodium per serving. Limit your sodium intake to less than 2,300 mg each day. Shopping Avoid buying pre-made and processed foods. Avoid buying drinks with added sugar. Cooking Cook with olive oil. Do not use butter, lard, or ghee. Bake, broil, grill, steam, or boil foods. Avoid frying. Meal planning  Work with your dietitian to create an eating plan that is right for you. This may include tracking how many calories you take in each day. Use a food diary, notebook, or mobile application to track what you eat at each meal. Consider following a Mediterranean diet. This includes: Eating several servings of fresh fruits and vegetables each day. Eating fish at least twice a week. Eating one serving each day of whole grains, beans, nuts, and seeds. Using olive oil instead of other fats. Limiting alcohol. Limiting red meat. Using nonfat or low-fat dairy products. Consider following a plant-based diet. This includes dietary choices that focus on eating mostly vegetables and fruit, grains, beans, nuts, and seeds. If you have high blood pressure, you may need to limit your sodium intake or follow a diet such as the DASH (Dietary Approaches to Stop Hypertension) eating plan. The DASH diet aims to lower high blood pressure. Lifestyle Set weight loss goals with help from your health care team. It is recommended that most people with prediabetes lose 7% of their body weight. Exercise for at least 30 minutes 5 or more days a week. Attend a support group or seek support from a mental health counselor. Take over-the-counter and  prescription medicines only as told by your health care provider. What foods are recommended? Fruits Berries. Bananas. Apples. Oranges. Grapes. Papaya. Mango. Pomegranate. Kiwi. Grapefruit. Cherries. Vegetables Lettuce. Spinach. Peas. Beets. Cauliflower. Cabbage. Broccoli. Carrots. Tomatoes. Squash. Eggplant. Herbs. Peppers. Onions. Cucumbers. Brussels sprouts. Grains Whole grains, such as whole-wheat or whole-grain breads, crackers, cereals, and pasta. Unsweetened oatmeal. Bulgur. Barley. Quinoa. Brown rice. Corn or whole-wheat flour tortillas or taco shells. Meats and other proteins Seafood. Poultry without skin. Lean cuts of pork and beef. Tofu. Eggs. Nuts. Beans. Dairy Low-fat or fat-free dairy products, such as yogurt, cottage cheese, and cheese. Beverages Water. Tea. Coffee. Sugar-free or diet soda. Seltzer water. Low-fat or nonfat milk. Milk alternatives, such as soy or almond milk. Fats and oils Olive oil. Canola oil. Sunflower oil. Grapeseed oil. Avocado. Walnuts. Sweets and desserts Sugar-free or low-fat pudding. Sugar-free or low-fat ice cream and other frozen treats. Seasonings and condiments Herbs. Sodium-free spices. Mustard. Relish. Low-salt, low-sugar ketchup. Low-salt, low-sugar barbecue sauce. Low-fat or fat-free mayonnaise. The items listed above may not be a complete list of recommended foods and beverages. Contact a dietitian for more information. What foods are not recommended? Fruits Fruits canned with syrup. Vegetables Canned vegetables. Frozen vegetables with butter or cream sauce. Grains Refined white flour and flour products, such as bread, pasta, snack foods, and cereals. Meats and other proteins Fatty cuts of meat. Poultry with skin. Breaded or fried meat. Processed meats. Dairy Full-fat yogurt, cheese, or milk. Beverages Sweetened drinks, such as iced  tea and soda. Fats and oils Butter. Lard. Ghee. Sweets and desserts Baked goods, such as cake,  cupcakes, pastries, cookies, and cheesecake. Seasonings and condiments Spice mixes with added salt. Ketchup. Barbecue sauce. Mayonnaise. The items listed above may not be a complete list of foods and beverages that are not recommended. Contact a dietitian for more information. Where to find more information American Diabetes Association: www.diabetes.org Summary You may need to make diet and lifestyle changes to help prevent the onset of diabetes. These changes can help you control blood sugar, improve cholesterol levels, and manage blood pressure. Set weight loss goals with help from your health care team. It is recommended that most people with prediabetes lose 7% of their body weight. Consider following a Mediterranean diet. This includes eating plenty of fresh fruits and vegetables, whole grains, beans, nuts, seeds, fish, and low-fat dairy, and using olive oil instead of other fats. This information is not intended to replace advice given to you by your health care provider. Make sure you discuss any questions you have with your health care provider. Document Revised: 12/20/2019 Document Reviewed: 12/20/2019 Elsevier Patient Education  [place].   Insulin Resistance Insulin is a hormone that is made by the pancreas. Insulin allows blood sugar (glucose) to enter the cells in the body. Insulin helps the body use glucose for energy. Normally, the body is insulin sensitive, which means the cells in the body are effective at absorbing glucose. Insulin resistance is when the cells in the body do not respond properly to insulin and are not able to absorb glucose. The pancreas makes more insulin, but over time the body cannot make enough insulin to keep glucose at normal levels. Insulin resistance results in high blood glucose levels (hyperglycemia) and can lead to problems, including: Prediabetes. Type 2 diabetes (diabetes mellitus). Heart disease. High blood pressure  (hypertension). Stroke. Polycystic ovary syndrome (PCOS). Nonalcoholic fatty liver disease. What are the causes? The exact cause of insulin resistance is not known. What increases the risk? The following factors may make you more likely to develop insulin resistance: Being overweight or obese, especially if a lot of your weight is in your waist area. Having an inactive (sedentary) lifestyle. Having above-normal glucose levels. Having abnormal cholesterol levels. Having sleep apnea. Being older than age 90. Using steroids. What are the signs or symptoms? This condition usually does not cause symptoms. A waist measurement of more than 35 inches (88.9 cm) for women and more than 40 inches (101.6 cm) for men may be a sign of insulin resistance. How is this diagnosed? There is no test to diagnose insulin resistance. However, your health care provider may diagnose insulin resistance based on: A physical exam. Your medical history. Blood tests that check your blood glucose level. How is this treated? Insulin resistance is treated with nutrition and lifestyle changes. These changes may include: Eating a healthy balance of nutritious foods. Getting more physical activity. Maintaining a healthy weight. Stopping the use of any tobacco products. Your health care provider will work with you to change your nutrition and lifestyle as needed. In some cases, treatment may also include medicine to improve your insulin sensitivity. Follow these instructions at home: Activity Be physically active. Do moderate-intensity physical activity for at least 30 minutes on 5 or more days of the week, or as told by your health care provider. This could include brisk walking, biking, or water aerobics. Ask your health care provider what activities are safe for you. A mix of  physical activities may be best, such as walking, swimming, biking, and strength training. Eating and drinking  Follow a healthy meal plan.  This includes eating: Lean proteins. Complex carbohydrates. Examples of these include whole grains, starchy vegetables (potatoes, corn, peas), and beans. Fresh fruits and vegetables. Low-fat dairy products. Healthy fats. Follow instructions from your health care provider about eating or drinking restrictions. Make an appointment to see a diet and nutrition specialist (registered dietitian) to help you create a healthy eating plan. General instructions Check your blood glucose levels as told by your health care provider. Take over-the-counter and prescription medicines only as told by your health care provider. Lose weight as told by your health care provider. Losing 5-7% of your body weight can reverse insulin resistance. Your health care provider can determine how much weight loss is best for you and can help you lose weight safely. Do not use any products that contain nicotine or tobacco. These products include cigarettes, chewing tobacco, and vaping devices, such as e-cigarettes. If you need help quitting, ask your health care provider. Keep all follow-up visits. This is important. Contact a health care provider if: You have trouble losing weight or maintaining your goal weight. You gain weight. You have trouble following your prescribed meal plan. You have trouble exercising more. Summary Insulin resistance occurs when cells in the body do not respond properly to insulin and are not able to absorb blood sugar (glucose). The body makes more insulin, but over time the body cannot make enough insulin to keep blood sugar at normal levels. Insulin resistance is treated with nutrition and lifestyle changes, including eating a healthy balance of nutritious foods, getting more physical activity, and maintaining a healthy weight. Your health care provider will work with you to change your nutrition and lifestyle as needed. Treatment may also include medicine to improve your insulin  sensitivity. Check your blood glucose levels as told by your health care provider. Keep all follow-up visits. This is important. This information is not intended to replace advice given to you by your health care provider. Make sure you discuss any questions you have with your health care provider. Document Revised: 06/09/2020 Document Reviewed: 06/09/2020 Elsevier Patient Education  [place].

## 2021-10-08 ENCOUNTER — Other Ambulatory Visit: Payer: Self-pay | Admitting: Nurse Practitioner

## 2021-10-08 DIAGNOSIS — I10 Essential (primary) hypertension: Secondary | ICD-10-CM

## 2021-10-14 ENCOUNTER — Ambulatory Visit: Payer: Commercial Managed Care - PPO

## 2021-10-14 DIAGNOSIS — E876 Hypokalemia: Secondary | ICD-10-CM

## 2021-10-14 DIAGNOSIS — I10 Essential (primary) hypertension: Secondary | ICD-10-CM

## 2021-10-14 DIAGNOSIS — Z833 Family history of diabetes mellitus: Secondary | ICD-10-CM

## 2021-10-14 DIAGNOSIS — R7301 Impaired fasting glucose: Secondary | ICD-10-CM

## 2021-10-14 NOTE — Progress Notes (Signed)
Pt in office for BP check. She is currently taking carvedilol 25 mg twice daily, nifedipine 30 mg once daily, and valsartan 160 mg once a day. She has been taking valsartan for about a week. She does not check BP daily. Checked BP over weekend and it was around 140s/80s.   Royce Macadamia, [place] 10/14/21 2:21 PM

## 2021-10-15 LAB — COMPREHENSIVE METABOLIC PANEL
ALT: 36 IU/L — ABNORMAL HIGH (ref 0–32)
AST: 23 IU/L (ref 0–40)
Albumin/Globulin Ratio: 1.9 (ref 1.2–2.2)
Albumin: 4.4 g/dL (ref 3.8–4.8)
Alkaline Phosphatase: 64 IU/L (ref 44–121)
BUN/Creatinine Ratio: 16 (ref 9–23)
BUN: 15 mg/dL (ref 6–24)
Bilirubin Total: 0.2 mg/dL (ref 0.0–1.2)
CO2: 22 mmol/L (ref 20–29)
Calcium: 9.5 mg/dL (ref 8.7–10.2)
Chloride: 103 mmol/L (ref 96–106)
Creatinine, Ser: 0.95 mg/dL (ref 0.57–1.00)
Globulin, Total: 2.3 g/dL (ref 1.5–4.5)
Glucose: 112 mg/dL — ABNORMAL HIGH (ref 70–99)
Potassium: 3.9 mmol/L (ref 3.5–5.2)
Sodium: 139 mmol/L (ref 134–144)
Total Protein: 6.7 g/dL (ref 6.0–8.5)
eGFR: 76 mL/min/{1.73_m2} (ref >59)

## 2021-10-15 LAB — HEMOGLOBIN A1C
Est. average glucose Bld gHb Est-mCnc: 108 mg/dL
Hgb A1c MFr Bld: 5.4 % (ref 4.8–5.6)

## 2021-10-16 ENCOUNTER — Other Ambulatory Visit: Payer: Self-pay

## 2021-10-16 ENCOUNTER — Ambulatory Visit
Admission: RE | Admit: 2021-10-16 | Discharge: 2021-10-16 | Disposition: A | Discharge status: Home / Self Care | Payer: Commercial Managed Care - PPO | Source: Ambulatory Visit | Attending: Nurse Practitioner | Admitting: Nurse Practitioner

## 2021-10-16 DIAGNOSIS — Z1231 Encounter for screening mammogram for malignant neoplasm of breast: Secondary | ICD-10-CM

## 2021-10-22 ENCOUNTER — Other Ambulatory Visit: Payer: Self-pay | Admitting: Nurse Practitioner

## 2021-10-22 DIAGNOSIS — Z720 Tobacco use: Secondary | ICD-10-CM

## 2021-10-22 DIAGNOSIS — R4589 Other symptoms and signs involving emotional state: Secondary | ICD-10-CM

## 2021-11-11 ENCOUNTER — Other Ambulatory Visit: Payer: Self-pay | Admitting: Nurse Practitioner

## 2021-11-11 DIAGNOSIS — I10 Essential (primary) hypertension: Secondary | ICD-10-CM

## 2021-12-11 ENCOUNTER — Other Ambulatory Visit: Payer: Self-pay | Admitting: Nurse Practitioner

## 2021-12-11 DIAGNOSIS — I10 Essential (primary) hypertension: Secondary | ICD-10-CM

## 2021-12-15 ENCOUNTER — Other Ambulatory Visit: Payer: Self-pay | Admitting: Nurse Practitioner

## 2021-12-15 DIAGNOSIS — F5101 Primary insomnia: Secondary | ICD-10-CM

## 2022-01-12 ENCOUNTER — Other Ambulatory Visit: Payer: Self-pay | Admitting: Nurse Practitioner

## 2022-01-12 DIAGNOSIS — I10 Essential (primary) hypertension: Secondary | ICD-10-CM

## 2022-01-12 LAB — HM PAP SMEAR

## 2022-01-13 ENCOUNTER — Ambulatory Visit: Payer: Commercial Managed Care - PPO | Admitting: Nurse Practitioner

## 2022-01-13 ENCOUNTER — Encounter: Payer: Self-pay | Admitting: Nurse Practitioner

## 2022-01-13 ENCOUNTER — Other Ambulatory Visit: Payer: Self-pay | Admitting: Nurse Practitioner

## 2022-01-13 VITALS — BP 138/96 | HR 62 | Temp 97.1°F | Ht 68.0 in | Wt 263.0 lb

## 2022-01-13 DIAGNOSIS — F17219 Nicotine dependence, cigarettes, with unspecified nicotine-induced disorders: Secondary | ICD-10-CM

## 2022-01-13 DIAGNOSIS — I10 Essential (primary) hypertension: Secondary | ICD-10-CM

## 2022-01-13 DIAGNOSIS — R600 Localized edema: Secondary | ICD-10-CM

## 2022-01-13 DIAGNOSIS — F5101 Primary insomnia: Secondary | ICD-10-CM

## 2022-01-13 DIAGNOSIS — R7301 Impaired fasting glucose: Secondary | ICD-10-CM

## 2022-01-13 MED ORDER — VALSARTAN 160 MG PO TABS: 320.0000 mg | ORAL_TABLET | Freq: Every day | ORAL | 3 refills | Status: DC

## 2022-01-13 MED ORDER — FUROSEMIDE 20 MG PO TABS: 20.0000 mg | ORAL_TABLET | Freq: Every day | ORAL | 3 refills | Status: DC | PRN

## 2022-01-13 MED ORDER — ZOLPIDEM TARTRATE 10 MG PO TABS: ORAL_TABLET | ORAL | 2 refills | Status: DC

## 2022-01-13 NOTE — Patient Instructions (Addendum)
Increase Diovan to 320 mg daily ?Monitor BP, keep log ?Notify office of any adverse side effects ?We will call you with lab results  ?Follow-up in 25-month, fasting ? ?Peripheral Edema ?Peripheral edema is swelling that is caused by a buildup of fluid. Peripheral edema most often affects the lower legs, ankles, and feet. It can also develop in the arms, hands, and face. The area of the body that has peripheral edema will look swollen. It may also feel heavy or warm. Your clothes may start to feel tight. Pressing on the area may make a temporary dent in your skin. You may not be able to move your swollen arm or leg as much as usual. ?There are many causes of peripheral edema. It can happen because of a complication of other conditions such as congestive heart failure, kidney disease, or a problem with your blood circulation. It also can be a side effect of certain medicines or because of an infection. It often happens to women during pregnancy. Sometimes, the cause is not known. ?Follow these instructions at home: ?Managing pain, stiffness, and swelling ? ?Raise (elevate) your legs while you are sitting or lying down. ?Move around often to prevent stiffness and to lessen swelling. ?Do not sit or stand for long periods of time. ?Wear support stockings as told by your health care provider. ?Medicines ?Take over-the-counter and prescription medicines only as told by your health care provider. ?Your health care provider may prescribe medicine to help your body get rid of excess water (diuretic). ?General instructions ?Pay attention to any changes in your symptoms. ?Follow instructions from your health care provider about limiting salt (sodium) in your diet. Sometimes, eating less salt may reduce swelling. ?Moisturize skin daily to help prevent skin from cracking and draining. ?Keep all follow-up visits as told by your health care provider. This is important. ?Contact a health care provider if you have: ?A fever. ?Edema  that starts suddenly or is getting worse, especially if you are pregnant or have a medical condition. ?Swelling in only one leg. ?Increased swelling, redness, or pain in one or both of your legs. ?Drainage or sores at the area where you have edema. ?Get help right away if you: ?Develop shortness of breath, especially when you are lying down. ?Have pain in your chest or abdomen. ?Feel weak. ?Feel faint. ?Summary ?Peripheral edema is swelling that is caused by a buildup of fluid. Peripheral edema most often affects the lower legs, ankles, and feet. ?Move around often to prevent stiffness and to lessen swelling. Do not sit or stand for long periods of time. ?Pay attention to any changes in your symptoms. ?Contact a health care provider if you have edema that starts suddenly or is getting worse, especially if you are pregnant or have a medical condition. ?Get help right away if you develop shortness of breath, especially when lying down. ?This information is not intended to replace advice given to you by your health care provider. Make sure you discuss any questions you have with your health care provider. ?Document Revised: 02/19/2021 Document Reviewed: 06/14/2018 ?Elsevier Patient Education ? [id number] ?Preventing Vitamin D Deficiency ?Vitamin D is a nutrient that helps your body absorb calcium from food. It plays a key role in the health of bones and teeth, muscle function, and infection prevention. ?Our bodies make vitamin D when our skin is exposed to direct sunlight. However, for many people, this may not be enough vitamin D to meet the body's needs. When you get  too little vitamin D, it is called a deficiency. ?How can this condition affect me? ?A vitamin D deficiency can put you at risk of developing conditions that cause bones to be brittle, such as rickets or osteoporosis. If you are over age 41, not having enough vitamin D may weaken your muscles and bones and increase your risk for falls and broken  bones. ?What can increase my risk? ?You may be at risk for a vitamin D deficiency if you: ?Are pregnant. ?Are obese. ?Are over 74 years old. ?Have dark skin. ?Take certain medicines that affect the way vitamin D is absorbed. ?Have had gastric bypass surgery. ?Other risk factors include: ?Having a condition that limits your ability to absorb fat, such as cystic fibrosis, celiac disease, or inflammatory bowel disease. ?Having certain inherited conditions. ?Not having access to foods rich in vitamin D. ?Having limited ability to move. ?Living in areas that have fewer hours of sunlight. ?Spending most of your day indoors, or you cover your skin all the time when you are outdoors. ?Breastfed infants are also at risk for vitamin D deficiency. ?What actions can I take to reduce my risk of a vitamin D deficiency? ?Knowing the best sources of vitamin D ?You can meet your daily vitamin D needs from: ?Foods. ?Dietary supplements. ?Direct exposure to natural sunlight. ?Infant formula (for babies). ?Knowing how much vitamin D you need ?General recommendations for daily vitamin D intake vary by these categories: ?Infants: 400 International Units. ?Children over 12 year old: 600 International Units. ?Adults: 600 International Units. ?Pregnant and breastfeeding women: 600 International Units. ?Adults over 33 years old: 800 International Units. ?These are minimum levels of recommended amounts. Your health care provider may recommend a different amount of vitamin D intake based on your specific needs and your overall health. ?Getting sun exposure ?Get regular, safe exposure to natural sunlight. Expose your skin to direct sunlight for at least 15 minutes every day. If you have dark skin, you may need to expose your skin for a longer period of time. ?Protect your skin from too much sun exposure. This helps to prevent skin cancer. ?Ask your health care provider if regular sun exposure is safe for you. ?Do not use a tanning bed. ?Eating  and drinking ? ?Eat foods that naturally contain vitamin D. These include: ?Beef liver. ?Egg yolk. ?Fatty fish, such as cod, salmon, trout, swordfish, shrimp, sardines, and tuna. ?Cheese. ?Mushrooms. ?Oysters. ?Eat or drink products that have been fortified with vitamin D. Fortified means that vitamin D has been added to the food. These may include: ?Cereals. ?Dairy products, such as milk, yogurt, butter, or margarine. ?Orange juice. ?Alternative milks, such as soy milk or almond milk. ?When choosing foods, check the food label on the package to see: ?How much vitamin D is in the item. ?If the food is fortified with vitamin D. ?Although it is hard to get your vitamin D requirement from foods alone, you should eat a balanced diet each day that includes foods naturally higher in vitamin D or fortified with it. Try to include the following in your diet each day: ?2-3 servings of meat or meat alternatives. ?2-3 servings of dairy. ?Taking supplements ?If you are at risk for vitamin D deficiency, or if you have certain diseases, your health care provider may recommend that you take a vitamin D supplement. Make sure you: ?Talk with your health care provider before you start taking any vitamin D supplements. You may be more sensitive to the side effects  of vitamin D supplements if you are on certain medicines or have certain medical conditions. ?Tell your health care provider about all medicines you are taking, including vitamin, mineral, and herbal supplements. ?Take medicines and supplements only as told by your health care provider. ?Summary ?Vitamin D is a nutrient that helps your body absorb calcium from food. ?A vitamin D deficiency can put you at risk of developing conditions that cause bones to be brittle, such as rickets or osteoporosis. ?Our bodies make vitamin D when our skin is exposed to direct sunlight. However, for many people, this may not be enough vitamin D to meet the body's needs. ?Some foods naturally  contain vitamin D, including beef liver, egg yolk, and fatty fish. ?Products may also be fortified with vitamin D. Fortified means that vitamin D has been added to the food. ?This information is not inte

## 2022-01-13 NOTE — Progress Notes (Signed)
? ?Subjective:  ?Patient ID: Amanda Barrett, female    DOB: 1976-06-19  Age: 46 y.o. MRN: [id number] ? ?Chief Complaint  ?Patient presents with  ? Hypertension  ? Insomnia  ? ? ?HPI ? Amanda Barrett is a 46 year old Caucasian female that presents for follow-up of hypertension and vitamin D deficiency. She was seen at Urgent Care yesterday for right neck lymphadenitis. States she was prescribed a course of Augmentin. Reports symptoms are improving ? ?Hypertension, follow-up: ?She was last seen for hypertension 3 months ago.  ?BP at that visit was 140/90. Management since that visit includes Coreg 25m, Procardia and Diovan daily. ? ?She reports excellent compliance with treatment. ?She is not having side effects.  ?She is following a Regular diet. ?She is not exercising. ?She does smoke. ? ?Use of agents associated with hypertension: none.  ? ?Symptoms: ?No chest pain No chest pressure  ?No palpitations No syncope  ?No dyspnea No orthopnea  ?No paroxysmal nocturnal dyspnea No lower extremity edema  ? ?Pertinent labs: ?Lab Results  ?Component Value Date  ? CHOL 174 09/22/2021  ? HDL 61 09/22/2021  ? [place]97 09/22/2021  ? TRIG 87 09/22/2021  ? CHOLHDL 2.9 09/22/2021  ? Lab Results  ?Component Value Date  ? NA 139 10/14/2021  ? K 3.9 10/14/2021  ? CREATININE 0.95 10/14/2021  ? EGFR 76 10/14/2021  ? GLUCOSE 112 (H) 10/14/2021  ?  ? ?The 10-year ASCVD risk score (Arnett DK, et al., 2019) is: 3%  ? ?Vitamin D deficiency, follow-up ? ?Lab Results  ?Component Value Date  ? VD25OH 17.2 (L) 09/22/2021  ? CALCIUM 9.5 10/14/2021  ? CALCIUM 9.6 09/22/2021  ? PTH 33 11/06/2018  ? ?Wt Readings from Last 3 Encounters:  ?01/13/22 263 lb (119.3 kg)  ?10/06/21 260 lb (117.9 kg)  ?09/15/21 258 lb (117 kg)  ? ? ?She was last seen for vitamin D deficiency 3 months ago.  ?Management since that visit includes Vitamin D 50, 000 U and Vit D rich diet. ?She reports excellent compliance with treatment. ?She is not having side effects.  ? ?Symptoms: ?No  change in energy level No numbness or tingling  ?No bone pain No unexplained fracture  ? ?---------------------------------------------------------------------------------------------------  ?Smoking cessation: ? ?Taking wellbutrin 150 qd BID ? ?  09/15/2021  ?  2:07 PM 09/15/2021  ?  2:06 PM 05/20/2021  ?  8:13 AM  ?Depression screen PHQ 2/9  ?Decreased Interest 0 0 0  ?Down, Depressed, Hopeless 0 0 0  ?PHQ - 2 Score 0 0 0  ?Altered sleeping 3    ?Tired, decreased energy 0    ?Change in appetite 0    ?Feeling bad or failure about yourself  0    ?Trouble concentrating 0    ?Moving slowly or fidgety/restless 0    ?Suicidal thoughts 0    ?PHQ-9 Score 3    ?Difficult doing work/chores Not difficult at all    ?  ? ? ?Current Outpatient Medications on File Prior to Visit  ?Medication Sig Dispense Refill  ? albuterol (PROAIR HFA) 108 (90 Base) MCG/ACT inhaler Inhale 2 puffs into the lungs every 4 (four) hours as needed for wheezing or shortness of breath. 1 each 2  ? buPROPion (WELLBUTRIN SR) 150 MG 12 hr tablet TAKE 1 TABLET BY MOUTH TWICE A DAY 180 tablet 3  ? carvedilol (COREG) 25 MG tablet TAKE 1 TABLET BY MOUTH 2 TIMES DAILY WITH A MEAL. 180 tablet 3  ? fluticasone (FLONASE) 50 MCG/ACT  nasal spray Place 2 sprays into both nostrils daily. 16 g 0  ? NIFEdipine (PROCARDIA-XL/NIFEDICAL-XL) 30 MG 24 hr tablet TAKE 1 TABLET BY MOUTH EVERY DAY 30 tablet 0  ? potassium chloride SA (KLOR-CON M) 10 MEQ tablet Take 1 tablet (10 mEq total) by mouth 2 (two) times daily. 60 tablet 3  ? valsartan (DIOVAN) 160 MG tablet Take 1 tablet (160 mg total) by mouth daily. 90 tablet 3  ? Vitamin D, Ergocalciferol, (DRISDOL) 1.25 MG (50000 UNIT) CAPS capsule TAKE 1 CAPSULE (50,000 UNITS TOTAL) BY MOUTH EVERY 7 (SEVEN) DAYS 4 capsule 2  ? zolpidem (AMBIEN) 10 MG tablet TAKE 1 TABLET BY MOUTH EVERYDAY AT BEDTIME 30 tablet 2  ? ?No current facility-administered medications on file prior to visit.  ? ?Past Medical History:  ?Diagnosis Date  ?  Anemia   ? Pernicious  ? Hypertension   ? ?Past Surgical History:  ?Procedure Laterality Date  ? MYOMECTOMY    ? Uterine Fibroids  ? SINUS EXPLORATION    ? TONSILLECTOMY    ?  ?Family History  ?Problem Relation Age of Onset  ? Stroke Mother   ? Hypertension Mother   ? Diabetes Mother   ?     type 1  ? Heart disease Mother   ? Hypertension Father   ? Diabetes Father   ?     Type 2  ? Cancer Maternal Grandmother   ?     Ovarian  ? Heart disease Maternal Grandmother   ? Hypertension Maternal Grandmother   ? Diabetes Maternal Grandmother   ?     Type 1  ? Kidney disease Maternal Grandmother   ? Diabetes Maternal Grandfather   ?     Type 2  ? Other Maternal Grandfather   ?     ITP  ? Hypotension Maternal Grandfather   ? Diabetes Paternal Grandmother   ?     Type 2  ? Hypertension Paternal Grandmother   ? Cancer Paternal Grandfather   ?     Bladder  ? Hypertension Paternal Grandfather   ? ?Social History  ? ?Socioeconomic History  ? Marital status: Married  ?  Spouse name: Gus Dahan  ? Number of children: Not on file  ? Years of education: Not on file  ? Highest education level: Not on file  ?Occupational History  ? 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 Smoking status: Every Day  ?  Packs/day: 0.25  ?  Types: Cigarettes  ? Smokeless tobacco: Never  ?Substance and Sexual Activity  ? Alcohol use: No  ? Drug use: No  ? Sexual activity: Yes  ?  Partners: Male  ?  Birth control/protection: None  ?Other Topics Concern  ? Not on file  ?Social History Narrative  ? Not on file  ? ?Social Determinants of Health  ? ?Financial Resource Strain: Low Risk   ? Difficulty of Paying Living Expenses: Not hard at all  ?Food Insecurity: No Food Insecurity  ? Worried About Charity fundraiser in the Last Year: Never true  ? Ran Out of Food in the Last Year: Never true  ?Transportation Needs: No Transportation Needs  ? Lack of Transportation (Medical): No  ? Lack of Transportation (Non-Medical): No   ?Physical Activity: Insufficiently Active  ? Days of Exercise per Week: 2 days  ? Minutes of Exercise per Session: 20 min  ?Stress: No Stress Concern Present  ? Feeling of  Stress : Not at all  ?Social Connections: Moderately Isolated  ? Frequency of Communication with Friends and Family: More than three times a week  ? Frequency of Social Gatherings with Friends and Family: Three times a week  ? Attends Religious Services: Never  ? Active Member of Clubs or Organizations: No  ? Attends Archivist Meetings: Never  ? Marital Status: Married  ? ? ?Review of Systems  ?Constitutional:  Positive for fatigue. Negative for chills and fever.  ?HENT:  Negative for congestion, ear pain, rhinorrhea and sore throat.   ?Respiratory:  Negative for cough and shortness of breath.   ?Cardiovascular:  Positive for leg swelling. Negative for chest pain.  ?Gastrointestinal:  Negative for abdominal pain, constipation, diarrhea, nausea and vomiting.  ?Genitourinary:  Negative for dysuria and urgency.  ?Musculoskeletal:  Negative for back pain and myalgias.  ?Allergic/Immunologic: Positive for environmental allergies.  ?Neurological:  Negative for dizziness, weakness, light-headedness and headaches.  ?Hematological:  Adenopathy: right neck.  ?Psychiatric/Behavioral:  Positive for sleep disturbance (insomnia). Negative for dysphoric mood. The patient is not nervous/anxious.   ?All other systems reviewed and are negative. ? ? ?Objective:  ?BP (!) 138/96   Pulse 62   Temp (!) 97.1 ?F (36.2 ?C)   Ht _0  (1.727 m)   Wt 263 lb (119.3 kg)   SpO2 97%   BMI 39.99 kg/m?   ? ? ?  01/13/2022  ?  9:22 AM 10/14/2021  ?  2:07 PM 10/06/2021  ?  3:17 PM  ?BP/Weight  ?Systolic BP  093 818  ?Diastolic BP  82 299  ?Wt. (Lbs) 263    ?BMI 39.99 kg/m2    ? ? ?Physical Exam ?Vitals reviewed.  ?Constitutional:   ?   Appearance: Normal appearance.  ?HENT:  ?   Head: Normocephalic.  ?   Right Ear: Tympanic membrane normal.  ?   Left Ear: Tympanic  membrane normal.  ?   Nose: Nose normal.  ?   Mouth/Throat:  ?   Mouth: Mucous membranes are moist.  ?Eyes:  ?   Pupils: Pupils are equal, round, and reactive to light.  ?Cardiovascular:  ?   Rate and Rhythm: Norm

## 2022-01-14 LAB — CBC WITH DIFFERENTIAL/PLATELET
Basophils Absolute: 0 10*3/uL (ref 0.0–0.2)
Basos: 1 %
EOS (ABSOLUTE): 0.2 10*3/uL (ref 0.0–0.4)
Eos: 4 %
Hematocrit: 40.4 % (ref 34.0–46.6)
Hemoglobin: 13.2 g/dL (ref 11.1–15.9)
Immature Grans (Abs): 0 10*3/uL (ref 0.0–0.1)
Immature Granulocytes: 0 %
Lymphocytes Absolute: 1.7 10*3/uL (ref 0.7–3.1)
Lymphs: 29 %
MCH: 26.3 pg — ABNORMAL LOW (ref 26.6–33.0)
MCHC: 32.7 g/dL (ref 31.5–35.7)
MCV: 81 fL (ref 79–97)
Monocytes Absolute: 0.5 10*3/uL (ref 0.1–0.9)
Monocytes: 8 %
Neutrophils Absolute: 3.5 10*3/uL (ref 1.4–7.0)
Neutrophils: 58 %
Platelets: 295 10*3/uL (ref 150–450)
RBC: 5.02 x10E6/uL (ref 3.77–5.28)
RDW: 14.5 % (ref 11.7–15.4)
WBC: 5.9 10*3/uL (ref 3.4–10.8)

## 2022-01-14 LAB — COMPREHENSIVE METABOLIC PANEL
ALT: 54 IU/L — ABNORMAL HIGH (ref 0–32)
AST: 43 IU/L — ABNORMAL HIGH (ref 0–40)
Albumin/Globulin Ratio: 1.9 (ref 1.2–2.2)
Albumin: 4.3 g/dL (ref 3.8–4.8)
Alkaline Phosphatase: 75 IU/L (ref 44–121)
BUN/Creatinine Ratio: 14 (ref 9–23)
BUN: 12 mg/dL (ref 6–24)
Bilirubin Total: 0.3 mg/dL (ref 0.0–1.2)
CO2: 25 mmol/L (ref 20–29)
Calcium: 9.1 mg/dL (ref 8.7–10.2)
Chloride: 103 mmol/L (ref 96–106)
Creatinine, Ser: 0.86 mg/dL (ref 0.57–1.00)
Globulin, Total: 2.3 g/dL (ref 1.5–4.5)
Glucose: 110 mg/dL — ABNORMAL HIGH (ref 70–99)
Potassium: 3.5 mmol/L (ref 3.5–5.2)
Sodium: 142 mmol/L (ref 134–144)
Total Protein: 6.6 g/dL (ref 6.0–8.5)
eGFR: 86 mL/min/{1.73_m2} (ref >59)

## 2022-01-14 LAB — LIPID PANEL
Chol/HDL Ratio: 3.4 ratio (ref 0.0–4.4)
Cholesterol, Total: 189 mg/dL (ref 100–199)
HDL: 56 mg/dL (ref >39)
LDL Chol Calc (NIH): 120 mg/dL — ABNORMAL HIGH (ref 0–99)
Triglycerides: 73 mg/dL (ref 0–149)
VLDL Cholesterol Cal: 13 mg/dL (ref 5–40)

## 2022-01-14 LAB — CARDIOVASCULAR RISK ASSESSMENT

## 2022-01-19 ENCOUNTER — Encounter: Payer: Self-pay | Admitting: Nurse Practitioner

## 2022-01-19 ENCOUNTER — Ambulatory Visit: Payer: Commercial Managed Care - PPO | Admitting: Legal Medicine

## 2022-01-19 ENCOUNTER — Encounter: Payer: Self-pay | Admitting: Legal Medicine

## 2022-01-19 VITALS — BP 170/120 | HR 66 | Temp 98.4°F | Resp 15 | Ht 68.0 in | Wt 265.0 lb

## 2022-01-19 DIAGNOSIS — I10 Essential (primary) hypertension: Secondary | ICD-10-CM | POA: Diagnosis not present

## 2022-01-19 DIAGNOSIS — T7840XA Allergy, unspecified, initial encounter: Secondary | ICD-10-CM

## 2022-01-19 MED ORDER — TRIAMCINOLONE ACETONIDE 40 MG/ML IJ SUSP
60.0000 mg | Freq: Once | INTRAMUSCULAR | Status: AC
  Administered 2022-01-19: 60 mg via INTRAMUSCULAR

## 2022-01-19 MED ORDER — TRIAMTERENE-HCTZ 37.5-25 MG PO CAPS: 1.0000 | ORAL_CAPSULE | Freq: Every day | ORAL | 2 refills | Status: DC

## 2022-01-19 NOTE — Progress Notes (Signed)
? ?Acute Office Visit ? ?Subjective:  ? ? Patient ID: Amanda Barrett, female    DOB: September 19, 1976, 46 y.o.   MRN: [id number] ? ?Chief Complaint  ?Patient presents with  ? Rash  ? ? ?HPI: ?Patient is in today for rash on abdomen area redness since 4 days ago with pruritus. She does not change detergent, or medicine. She took amoxicillin for 10 days and she finished 2 days ago. Patient took benadryl  pills and cortisone cream, and it did not help her. ? ?Hypertension for 23 years. ? ?Past Medical History:  ?Diagnosis Date  ? Anemia   ? Pernicious  ? Hypertension   ? ? ?Past Surgical History:  ?Procedure Laterality Date  ? MYOMECTOMY    ? Uterine Fibroids  ? SINUS EXPLORATION    ? TONSILLECTOMY    ? ? ?Family History  ?Problem Relation Age of Onset  ? Stroke Mother   ? Hypertension Mother   ? Diabetes Mother   ?     type 1  ? Heart disease Mother   ? Hypertension Father   ? Diabetes Father   ?     Type 2  ? Cancer Maternal Grandmother   ?     Ovarian  ? Heart disease Maternal Grandmother   ? Hypertension Maternal Grandmother   ? Diabetes Maternal Grandmother   ?     Type 1  ? Kidney disease Maternal Grandmother   ? Diabetes Maternal Grandfather   ?     Type 2  ? Other Maternal Grandfather   ?     ITP  ? Hypotension Maternal Grandfather   ? Diabetes Paternal Grandmother   ?     Type 2  ? Hypertension Paternal Grandmother   ? Cancer Paternal Grandfather   ?     Bladder  ? Hypertension Paternal Grandfather   ? ? ?Social History  ? ?Socioeconomic History  ? Marital status: Married  ?  Spouse name: Gus Laske  ? Number of children: Not on file  ? Years of education: Not on file  ? Highest education level: Not on file  ?Occupational History  ? 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 Smoking status: Every Day  ?  Packs/day: 0.25  ?  Types: Cigarettes  ? Smokeless tobacco: Never  ?Substance and Sexual Activity  ? Alcohol use: No  ? Drug use: No  ? Sexual activity: Yes  ?  Partners: Male   ?  Birth control/protection: None  ?Other Topics Concern  ? Not on file  ?Social History Narrative  ? Not on file  ? ?Social Determinants of Health  ? ?Financial Resource Strain: Low Risk   ? Difficulty of Paying Living Expenses: Not hard at all  ?Food Insecurity: No Food Insecurity  ? Worried About Charity fundraiser in the Last Year: Never true  ? Ran Out of Food in the Last Year: Never true  ?Transportation Needs: No Transportation Needs  ? Lack of Transportation (Medical): No  ? Lack of Transportation (Non-Medical): No  ?Physical Activity: Insufficiently Active  ? Days of Exercise per Week: 2 days  ? Minutes of Exercise per Session: 20 min  ?Stress: No Stress Concern Present  ? Feeling of Stress : Not at all  ?Social Connections: Moderately Isolated  ? Frequency of Communication with Friends and Family: More than three times a week  ? Frequency of Social Gatherings with Friends and Family: Three times  a week  ? Attends Religious Services: Never  ? Active Member of Clubs or Organizations: No  ? Attends Archivist Meetings: Never  ? Marital Status: Married  ?Intimate Partner Violence: Not At Risk  ? Fear of Current or Ex-Partner: No  ? Emotionally Abused: No  ? Physically Abused: No  ? Sexually Abused: No  ? ? ?Outpatient Medications Prior to Visit  ?Medication Sig Dispense Refill  ? albuterol (PROAIR HFA) 108 (90 Base) MCG/ACT inhaler Inhale 2 puffs into the lungs every 4 (four) hours as needed for wheezing or shortness of breath. 1 each 2  ? buPROPion (WELLBUTRIN SR) 150 MG 12 hr tablet TAKE 1 TABLET BY MOUTH TWICE A DAY 180 tablet 3  ? carvedilol (COREG) 25 MG tablet TAKE 1 TABLET BY MOUTH 2 TIMES DAILY WITH A MEAL. 180 tablet 3  ? fluticasone (FLONASE) 50 MCG/ACT nasal spray Place 2 sprays into both nostrils daily. 16 g 0  ? furosemide (LASIX) 20 MG tablet Take 1 tablet (20 mg total) by mouth daily as needed for edema. 90 tablet 3  ? NIFEdipine (PROCARDIA-XL/NIFEDICAL-XL) 30 MG 24 hr tablet TAKE 1  TABLET BY MOUTH EVERY DAY 30 tablet 0  ? potassium chloride SA (KLOR-CON M) 10 MEQ tablet Take 1 tablet (10 mEq total) by mouth 2 (two) times daily. 60 tablet 3  ? valsartan (DIOVAN) 160 MG tablet Take 2 tablets (320 mg total) by mouth daily. 180 tablet 3  ? Vitamin D, Ergocalciferol, (DRISDOL) 1.25 MG (50000 UNIT) CAPS capsule TAKE 1 CAPSULE (50,000 UNITS TOTAL) BY MOUTH EVERY 7 (SEVEN) DAYS 4 capsule 2  ? zolpidem (AMBIEN) 10 MG tablet Take one tablet by mouth at night as needed for insomnia 30 tablet 2  ? ?No facility-administered medications prior to visit.  ? ? ?No Known Allergies ? ?Review of Systems  ?Constitutional:  Negative for chills, fatigue and fever.  ?HENT:  Negative for congestion, ear pain and sore throat.   ?Respiratory:  Negative for cough and shortness of breath.   ?Cardiovascular:  Negative for chest pain and palpitations.  ?Gastrointestinal:  Negative for abdominal pain, constipation, diarrhea, nausea and vomiting.  ?Endocrine: Negative for polydipsia, polyphagia and polyuria.  ?Genitourinary:  Negative for difficulty urinating and dysuria.  ?Musculoskeletal:  Negative for arthralgias, back pain and myalgias.  ?Skin:  Positive for rash (abdomen area).  ?Neurological:  Negative for headaches.  ?Psychiatric/Behavioral:  Negative for dysphoric mood. The patient is not nervous/anxious.   ? ?   ?Objective:  ?  ?Physical Exam ?Vitals reviewed.  ?Constitutional:   ?   Appearance: Normal appearance. She is obese.  ?HENT:  ?   Right Ear: Tympanic membrane normal.  ?   Left Ear: Tympanic membrane normal.  ?   Mouth/Throat:  ?   Mouth: Mucous membranes are moist.  ?   Pharynx: Oropharynx is clear.  ?Eyes:  ?   Conjunctiva/sclera: Conjunctivae normal.  ?   Pupils: Pupils are equal, round, and reactive to light.  ?Cardiovascular:  ?   Rate and Rhythm: Normal rate and regular rhythm.  ?   Pulses: Normal pulses.  ?   Heart sounds: Normal heart sounds. No murmur heard. ?  No gallop.  ?Pulmonary:  ?   Effort:  Pulmonary effort is normal.  ?   Breath sounds: Normal breath sounds.  ?Abdominal:  ?   General: Abdomen is flat. Bowel sounds are normal. There is no distension.  ?   Tenderness: There is no abdominal tenderness.  ?  Musculoskeletal:     ?   General: Normal range of motion.  ?   Right lower leg: No edema.  ?   Left lower leg: No edema.  ?Skin: ?   Capillary Refill: Capillary refill takes less than 2 seconds.  ?   Comments: Redness abdominal wall, no blisters  ?Neurological:  ?   General: No focal deficit present.  ?   Mental Status: She is alert and oriented to person, place, and time.  ? ? ?BP (!) 170/120   Pulse 66   Temp 98.4 ?F (36.9 ?C)   Resp 15   Ht _0  (1.727 m)   Wt 265 lb (120.2 kg)   SpO2 97%   BMI 40.29 kg/m?  ?Wt Readings from Last 3 Encounters:  ?01/19/22 265 lb (120.2 kg)  ?01/13/22 263 lb (119.3 kg)  ?10/06/21 260 lb (117.9 kg)  ? ? ?There are no preventive care reminders to display for this patient. ? ?There are no preventive care reminders to display for this patient. ? ? ?Lab Results  ?Component Value Date  ? TSH 2.990 09/22/2021  ? ?Lab Results  ?Component Value Date  ? WBC 5.9 01/13/2022  ? HGB 13.2 01/13/2022  ? HCT 40.4 01/13/2022  ? MCV 81 01/13/2022  ? PLT 295 01/13/2022  ? ?Lab Results  ?Component Value Date  ? NA 142 01/13/2022  ? K 3.5 01/13/2022  ? CO2 25 01/13/2022  ? GLUCOSE 110 (H) 01/13/2022  ? BUN 12 01/13/2022  ? CREATININE 0.86 01/13/2022  ? BILITOT 0.3 01/13/2022  ? ALKPHOS 75 01/13/2022  ? AST 43 (H) 01/13/2022  ? ALT 54 (H) 01/13/2022  ? PROT 6.6 01/13/2022  ? ALBUMIN 4.3 01/13/2022  ? CALCIUM 9.1 01/13/2022  ? EGFR 86 01/13/2022  ? GFR 67.41 11/06/2018  ? ?Lab Results  ?Component Value Date  ? CHOL 189 01/13/2022  ? ?Lab Results  ?Component Value Date  ? HDL 56 01/13/2022  ? ?Lab Results  ?Component Value Date  ? LDLCALC 120 (H) 01/13/2022  ? ?Lab Results  ?Component Value Date  ? TRIG 73 01/13/2022  ? ?Lab Results  ?Component Value Date  ? CHOLHDL 3.4 01/13/2022   ? ?Lab Results  ?Component Value Date  ? HGBA1C 5.4 10/14/2021  ? ? ?   ?Assessment & Plan:  ? ?Problem List Items Addressed This Visit   ? ?  ? Cardiovascular and Mediastinum  ? Essential hypertension - Primary

## 2022-01-20 LAB — SPECIMEN STATUS REPORT

## 2022-01-20 LAB — HGB A1C W/O EAG: Hgb A1c MFr Bld: 5.6 % (ref 4.8–5.6)

## 2022-01-25 ENCOUNTER — Other Ambulatory Visit: Payer: Self-pay | Admitting: Nurse Practitioner

## 2022-01-25 ENCOUNTER — Encounter: Payer: Self-pay | Admitting: Nurse Practitioner

## 2022-01-25 ENCOUNTER — Telehealth: Payer: Self-pay | Admitting: Nurse Practitioner

## 2022-01-25 DIAGNOSIS — R21 Rash and other nonspecific skin eruption: Secondary | ICD-10-CM

## 2022-01-25 DIAGNOSIS — I10 Essential (primary) hypertension: Secondary | ICD-10-CM

## 2022-01-25 MED ORDER — LORATADINE 10 MG PO TABS: 10.0000 mg | ORAL_TABLET | Freq: Every day | ORAL | 1 refills | Status: DC

## 2022-01-25 MED ORDER — FAMOTIDINE 20 MG PO TABS: 20.0000 mg | ORAL_TABLET | Freq: Two times a day (BID) | ORAL | 1 refills | Status: DC

## 2022-01-25 NOTE — Telephone Encounter (Signed)
Telephoned pt concerning prolonged rash after taking a course of Amoxicillin. Pt had received Kenalog 60 mg injection in office on 01/19/22 per Dr Henrene Pastor order. States medication helped initially but rash returned. Treatment has included OTC Benadryl. Pt works as a Psychiatric nurse. Recommend Claritin 10 mg and Pepcid 20 mg daily for rash and itching. She is scheduled for next appt 02/02/22. Pt verbalized understanding. ?

## 2022-01-28 ENCOUNTER — Other Ambulatory Visit: Payer: Self-pay | Admitting: Nurse Practitioner

## 2022-01-28 DIAGNOSIS — E876 Hypokalemia: Secondary | ICD-10-CM

## 2022-02-02 ENCOUNTER — Encounter: Payer: Self-pay | Admitting: Nurse Practitioner

## 2022-02-02 ENCOUNTER — Ambulatory Visit: Payer: Commercial Managed Care - PPO | Admitting: Nurse Practitioner

## 2022-02-02 ENCOUNTER — Ambulatory Visit: Payer: Commercial Managed Care - PPO

## 2022-02-02 VITALS — BP 124/78 | HR 59 | Temp 99.2°F | Resp 18 | Ht 68.0 in | Wt 263.0 lb

## 2022-02-02 DIAGNOSIS — L509 Urticaria, unspecified: Secondary | ICD-10-CM

## 2022-02-02 MED ORDER — CETIRIZINE HCL 10 MG PO TABS: 10.0000 mg | ORAL_TABLET | Freq: Every day | ORAL | 2 refills | Status: DC

## 2022-02-02 MED ORDER — TRIAMCINOLONE ACETONIDE 0.1 % EX CREA: 1.0000 "application " | TOPICAL_CREAM | Freq: Two times a day (BID) | CUTANEOUS | 0 refills | Status: DC

## 2022-02-02 MED ORDER — CETIRIZINE HCL 10 MG PO TABS: 10.0000 mg | ORAL_TABLET | Freq: Every day | ORAL | 11 refills | Status: DC

## 2022-02-02 MED ORDER — EPINEPHRINE 0.3 MG/0.3ML IJ SOAJ: 0.3000 mg | INTRAMUSCULAR | 1 refills | Status: DC | PRN

## 2022-02-02 NOTE — Progress Notes (Addendum)
? ?  Established Patient Office Visit ? ?Subjective   ?Patient ID: Amanda Barrett, female    DOB: 1976/03/31  Age: 46 y.o. MRN: [id number] ? ?Chief Complaint  ?Patient presents with  ? Rash  ?  2-3 weeks  ? ? ?Rash ? ?Amanda Barrett is a 46 year old Caucasian female that presents for follow-up of chronic pruritic urticaria. Onset was approximately three weeks ago. Treatment has included Kenalog 60 mg IM on 01/19/22, Claritin 10 mg, Famotidine 20 mg, and Benadryl. She denies any new medications, soaps, detergents, or lotions. She has changed all cleaning and laundry products to sensitive skin type without dyes or fragrance added with little improvement. States hives move to various areas of body. At today's visit urticaria is on right lateral lower extremity and groin. She is a self-employed Psychiatric nurse.  ? ? ?Review of Systems  ?Skin:  Positive for rash.  ? ?  ?Objective:  ?  ? ?BP 124/78   Pulse (!) 59   Temp 99.2 ?F (37.3 ?C)   Resp 18   Ht '5\' 8"'$  (1.727 m)   Wt 263 lb (119.3 kg)   SpO2 98%   BMI 39.99 kg/m?   ? ? ?Physical Exam ?Vitals reviewed.  ?Constitutional:   ?   Appearance: She is obese.  ?Skin: ?   Capillary Refill: Capillary refill takes less than 2 seconds.  ?   Findings: Rash present. Rash is urticarial.  ? ?    ?Neurological:  ?   General: No focal deficit present.  ?   Mental Status: She is alert and oriented to person, place, and time.  ? ? ? ? ? ?  ? ? ? ?  ?Assessment & Plan:  ? ?1. Urticaria ?- triamcinolone cream (KENALOG) 0.1 %; Apply 1 application. topically 2 (two) times daily.  Dispense: 30 g; Refill: 0 ?- cetirizine (ZYRTEC) 10 MG tablet; Take 1 tablet (10 mg total) by mouth daily.  Dispense: 180 tablet; Refill: 2 ?- EPINEPHrine (EPIPEN 2-PAK) 0.3 mg/0.3 mL IJ SOAJ injection; Inject 0.3 mg into the muscle as needed for anaphylaxis.  Dispense: 1 each; Refill: 1 ?- Ambulatory referral to Allergy ?  ? ? ?Change Claritin to Zyrtec 10 mg daily ?Apply Triamcinolone cream to rash twice daily ?We will call  you with appt with allergist ?Continue Pepcid 20 mg  ?Follow-up in 29-month fasting July 24th, 2023 at 8:00 ? ?Follow-up: July 24th, 2023 at 8:00, fasting  ? ?I, SRip Harbour NP, have reviewed all documentation for this visit. The documentation on 02/02/22 for the exam, diagnosis, procedures, and orders are all accurate and complete.  ? ? ?Signed, ?SRip Harbour NP ? ?

## 2022-02-02 NOTE — Patient Instructions (Addendum)
Change Claritin to Zyrtec 10 mg daily ?Apply Triamcinolone cream to rash twice daily ?We will call you with appt with allergist ?Continue Pepcid 20 mg  ?Follow-up in 11-month fasting July 24th, 2023 at 8:00 ? ?Hives ?Hives are itchy, red, swollen areas on your skin. Hives can show up on any part of your body. Hives often fade within 24 hours (acute hives). New hives can show up after old ones fade. This can go on for many days or weeks (chronic hives). Hives do not spread from person to person (are not contagious). ?Hives are caused by your body's response to something that you are allergic to (allergen). These are sometimes called triggers. You can get hives right after being around a trigger, or hours later. ?What are the causes? ?Allergies to foods. ?Insect bites or stings. ?Exposure to pollen or pets. ?Spending time in sunlight, heat, or cold. ?Exercise. ?Stress. ?You can also get hives from other medical conditions and treatments, such as: ?Some medicines. ?Chemicals or latex. ?Viruses. This includes the common cold. ?Infections caused by germs (bacteria). ?Allergy shots. ?Blood transfusions. ?Sometimes, the cause is not known. ?What increases the risk? ?Being a woman. ?Being allergic to foods such as: ?Citrus fruits. ?Milk. ?Eggs. ?Peanuts. ?Tree nuts. ?Shellfish. ?Being allergic to: ?Medicines. ?Latex. ?Insects. ?Animals. ?Pollen. ?What are the signs or symptoms? ? ?Raised, itchy, red or white bumps or patches on your skin. These areas may: ?Get large and swollen. ?Change in shape and location. ?Stand alone or connect to each other over a large area of skin. ?Sting or hurt. ?Turn white when pressed in the center (blanch). ?In very bad cases, your hands, feet, and face may also get swollen. This may happen if hives start deeper in your skin. ?How is this treated? ?Treatment for this condition depends on your symptoms. Treatment may include: ?Using cool, wet cloths (cool compresses) or taking cool showers to  stop the itching. ?Medicines that help: ?Relieve itching (antihistamines). ?Reduce swelling (corticosteroids). ?Treat infection (antibiotics). ?A medicine (omalizumab) that is given as a shot (injection). Your doctor may prescribe this if you have hives that do not get better even after other treatments. ?In very bad cases, you may need a shot of a medicine called epinephrine to prevent a life-threatening allergic reaction (anaphylaxis). ?Follow these instructions at home: ?Medicines ?Take or apply over-the-counter and prescription medicines only as told by your doctor. ?If you were prescribed an antibiotic medicine, use it as told by your doctor. Do not stop using it even if you start to feel better. ?Skin care ?Apply cool, wet cloths to the hives. ?Do not scratch your skin. Do not rub your skin. ?General instructions ?Do not take hot showers or baths. This can make itching worse. ?Do not wear tight clothes. ?Use sunscreen and wear clothes that cover your skin when you are outside. ?Avoid any triggers that cause your hives. Keep a journal to help track what causes your hives. Write down: ?What medicines you take. ?What you eat and drink. ?What products you use on your skin. ?Keep all follow-up visits as told by your doctor. This is important. ?Contact a doctor if: ?Your symptoms are not better with medicine. ?Your joints hurt or are swollen. ?Get help right away if: ?You have a fever. ?You have pain in your belly (abdomen). ?Your tongue or lips are swollen. ?Your eyelids are swollen. ?Your chest or throat feels tight. ?You have trouble breathing or swallowing. ?These symptoms may be an emergency. Do not wait to see if  the symptoms will go away. Get medical help right away. Call your local emergency services (911 in the [place] Do not drive yourself to the hospital. ?Summary ?Hives are itchy, red, swollen areas on your skin. ?Treatment for this condition depends on your symptoms. ?Avoid things that cause your hives.  Keep a journal to help track what causes your hives. ?Take and apply over-the-counter and prescription medicines only as told by your doctor. ?Get help right away if your chest or throat feels tight or if you have trouble breathing or swallowing. ?This information is not intended to replace advice given to you by your health care provider. Make sure you discuss any questions you have with your health care provider. ?Document Revised: 11/07/2020 Document Reviewed: 11/09/2020 ?Elsevier Patient Education ? [employer]. ? ?

## 2022-02-15 ENCOUNTER — Other Ambulatory Visit: Payer: Self-pay | Admitting: Nurse Practitioner

## 2022-02-15 DIAGNOSIS — I10 Essential (primary) hypertension: Secondary | ICD-10-CM

## 2022-02-22 NOTE — Progress Notes (Unsigned)
New Patient Note  RE: Amanda Barrett MRN: [id number] DOB: 1976/03/29 Date of Office Visit: 02/23/2022  Consult requested by: Rip Harbour, NP Primary care provider: Rip Harbour, NP  Chief Complaint: Urticaria (Started April 15th - hives on her chest and moved downward - too benadryl. 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 She is a 46 y.o. female, who is referred here by Rip Harbour, NP for the evaluation of hives.  Rash started on April 15th, 2023. She initially broke out on her chest and the rash then traveled to her abdominal area.  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 She has done this before with no issue. No new flowers were used.   Describes them as itchy, red, raised. Individual rashes lasts about a few hours. No ecchymosis upon resolution. Associated symptoms include: a few episodes of lip angioedema.  Frequency of episodes: daily until she started on zyrtec/pepcid combination. Suspected triggers are unknown. Denies any fevers, chills, foods, personal care products or recent infections. She has tried the following therapies: zyrtec, famotidine with good benefit.  She had an enlarged lymph node for which she was given some type of antibiotics.  Systemic steroids yes. Currently on zyrtec '10mg'$  BID, famotidine '20mg'$  BID.  Previous work up includes: none. April 2023 bloodwork - slightly elevated AST, ALT.  Previous history of rash/hives: no. Patient is up to date with the following cancer screening tests: physical exam, pap smears, mammogram.  02/02/2022 PCP visit: "Amanda Barrett is a 46 year old Caucasian female that presents for follow-up of chronic pruritic urticaria. Onset was approximately three weeks ago. Treatment has included Kenalog 60 mg IM on 01/19/22, Claritin 10 mg, Famotidine  20 mg, and Benadryl. She denies any new medications, soaps, detergents, or lotions. She has changed all cleaning and laundry products to sensitive skin type without dyes or fragrance added with little improvement. States hives move to various areas of body. At today's visit urticaria is on right lateral lower extremity and groin. She is a self-employed Psychiatric nurse."  Assessment and Plan: Amanda Barrett is a 46 y.o. female with: Urticaria Initially broke out on April 15th on her chest which then traveled to her abdominal area.  Denies any changes in diet, personal care products.  She was on some type of antibiotics for enlarged lymph nodes. 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  She never had issues with flowers previously.  Currently on Zyrtec 10 mg twice daily and famotidine 20 mg twice a day with good benefit.  Slightly elevated AST and ALT.  Concerned about allergic triggers. Today's skin prick testing showed: Negative to indoor/outdoor allergens and common foods.  Start zyrtec (cetirizine) '10mg'$  twice a day. If symptoms are not controlled or causes drowsiness let us know. Start pepcid (famotidine) '20mg'$  twice a day.  Avoid the following potential triggers: alcohol, tight clothing, NSAIDs, hot showers and getting overheated. Get bloodwork to rule out other etiologies.  Angio-edema Noted few episodes of lip angioedema with the above hives. See assessment and plan as above.  Chronic rhinitis Some rhinoconjunctivitis symptoms in the spring and fall.  Takes over-the-counter antihistamines and Flonase as needed with good benefit.  Usually gets 2 sinus infections per year. Today's skin prick testing  showed: Negative to indoor/outdoor allergens.  Use Flonase (fluticasone) nasal spray 1 spray per nostril twice a day as needed for nasal congestion.  Nasal saline spray (i.e., Simply Saline) or nasal saline lavage (i.e., NeilMed) is recommended as needed and prior to  medicated nasal sprays. Use over the counter antihistamines such as Zyrtec (cetirizine), Claritin (loratadine), Allegra (fexofenadine), or Xyzal (levocetirizine) daily as needed. May switch antihistamines every few months. Keep track of sinus infections.   Shortness of breath Noticed some shortness of breath episodes and uses albuterol on rare occasions. This improved with starting zyrtec and famotidine. Today's spirometry was normal. May use albuterol rescue inhaler 2 puffs every 4 to 6 hours as needed for shortness of breath, chest tightness, coughing, and wheezing. Monitor frequency of use.   Return in about 2 months (around 04/25/2022).  No orders of the defined types were placed in this encounter.  Lab Orders         Allergen Fire Ant         Allergen Hymenoptera Panel         Alpha-Gal Panel         ANA w/Reflex         C3 and C4         C1 esterase inhibitor, functional         C1 Esterase Inhibitor         Chronic Urticaria         C-reactive protein         Comprehensive metabolic panel         Thyroid Cascade Profile         Sedimentation rate         Tryptase         Complement component c1q      Other allergy screening: Asthma: no Sometimes uses albuterol prn on rare occasions with minimal benefit.   Rhino conjunctivitis: yes Sneezing, watery eyes in the spring and fall. Takes OTC antihistamines, Flonase prn.  Usually gets 2 sinus infections per year. No prior allergy testing. Saw ENT as a child for tonsillectomy and sinus surgery.   Food allergy: no Medication allergy: no Hymenoptera allergy: no Eczema:no History of recurrent infections suggestive of immunodeficency: no  Diagnostics: Spirometry:  Tracings reviewed. Her effort: Good reproducible efforts. FVC: 3.65L FEV1: 3.00L, 95% predicted FEV1/FVC ratio: 82% Interpretation: Spirometry consistent with normal pattern.  Please see scanned spirometry results for details.  Skin Testing: Environmental  allergy panel and select foods. Negative to indoor/outdoor allergens and common foods.  Results discussed with patient/family.  Airborne Adult Perc - 02/23/22 1442     Time Antigen Placed 1443    Allergen Manufacturer Lavella Hammock    Location Back    Number of Test 59    Panel 1 Select    1. Control-Buffer 50% Glycerol Negative    2. Control-Histamine 1 mg/ml 2+    3. Albumin saline Negative    4. [place] Negative    5. [place] Negative    6. [place] Negative    7. [place] Blue Negative    8. Meadow Fescue Negative    9. Perennial Rye Negative    10. Sweet Vernal Negative    11. Timothy Negative    12. Cocklebur Negative    13. Burweed Marshelder Negative    14. Ragweed, short Negative    15. Ragweed, Giant Negative    16. Plantain,  English Negative    17. Lamb's Quarters Negative    18.  Sheep Sorrell Negative    19. Rough Pigweed Negative    20. Marsh Elder, Rough Negative    21. Mugwort, Common Negative    22. Ash mix Negative    23. Birch mix Negative    24. Beech American Negative    25. Box, Elder Negative    26. Cedar, red Negative    27. Cottonwood, [place] Negative    28. Elm mix Negative    29. Hickory Negative    30. Maple mix Negative    31. Oak, [place] mix Negative    32. Pecan Pollen Negative    33. Pine mix Negative    34. Sycamore Eastern Negative    35. [place], Black Pollen Negative    36. Alternaria alternata Negative    37. Cladosporium Herbarum Negative    38. Aspergillus mix Negative    39. Penicillium mix Negative    40. Bipolaris sorokiniana (Helminthosporium) Negative    41. Drechslera spicifera (Curvularia) Negative    42. Mucor plumbeus Negative    43. Fusarium moniliforme Negative    44. Aureobasidium pullulans (pullulara) Negative    45. Rhizopus oryzae Negative    46. Botrytis cinera Negative    47. Epicoccum nigrum Negative    48. Phoma betae Negative    49. Candida Albicans Negative    50. Trichophyton mentagrophytes Negative    51.  Mite, D Farinae  5,000 AU/ml Negative    52. Mite, D Pteronyssinus  5,000 AU/ml Negative    53. Cat Hair 10,000 BAU/ml Negative    54.  Dog Epithelia Negative    55. Mixed Feathers Negative    56. Horse Epithelia Negative    57. Cockroach, German Negative    58. Mouse Negative    59. Tobacco Leaf Negative             Food Perc - 02/23/22 1443       Test Information   Time Antigen Placed 0045    Allergen Manufacturer Lavella Hammock    Location Back    Number of allergen test [place]   1. Peanut Negative    2. Soybean food Negative    3. Wheat, whole Negative    4. Sesame Negative    5. Milk, cow Negative    6. Egg White, chicken Negative    7. Casein Negative    8. Shellfish mix Negative    9. Fish mix Negative    10. Cashew Negative             Past Medical History: Patient Active Problem List   Diagnosis Date Noted   Urticaria 02/23/2022   Angio-edema 02/23/2022   Shortness of breath 02/23/2022   Chronic rhinitis 02/23/2022   Uterine leiomyoma 05/20/2021   Primary insomnia 05/20/2021   Allergic rhinitis 05/20/2021   Asthma 05/20/2021   Tobacco use disorder, continuous 05/20/2021   Essential hypertension 09/14/2018   Past Medical History:  Diagnosis Date   Anemia    Pernicious   Angio-edema    Hypertension    Recurrent upper respiratory infection (URI)    Urticaria    Past Surgical History: Past Surgical History:  Procedure Laterality Date   ADENOIDECTOMY     MYOMECTOMY     Uterine Fibroids   SINOSCOPY     SINUS EXPLORATION     TONSILLECTOMY     Medication List:  Current Outpatient Medications  Medication Sig Dispense Refill   albuterol (  PROAIR HFA) 108 (90 Base) MCG/ACT inhaler Inhale 2 puffs into the lungs every 4 (four) hours as needed for wheezing or shortness of breath. 1 each 2   buPROPion (WELLBUTRIN SR) 150 MG 12 hr tablet TAKE 1 TABLET BY MOUTH TWICE A DAY 180 tablet 3   carvedilol (COREG) 25 MG tablet TAKE 1 TABLET  BY MOUTH 2 TIMES DAILY WITH A MEAL. 90 tablet 2   cetirizine (ZYRTEC) 10 MG tablet Take 1 tablet (10 mg total) by mouth daily. 180 tablet 2   EPINEPHrine (EPIPEN 2-PAK) 0.3 mg/0.3 mL IJ SOAJ injection Inject 0.3 mg into the muscle as needed for anaphylaxis. 1 each 1   famotidine (PEPCID) 20 MG tablet Take 1 tablet (20 mg total) by mouth 2 (two) times daily. 90 tablet 1   fluticasone (FLONASE) 50 MCG/ACT nasal spray Place 2 sprays into both nostrils daily. 16 g 0   KLOR-CON M10 10 MEQ tablet TAKE 1 TABLET BY MOUTH 2 TIMES DAILY. 60 tablet 3   NIFEdipine (PROCARDIA-XL/NIFEDICAL-XL) 30 MG 24 hr tablet TAKE 1 TABLET BY MOUTH EVERY DAY 30 tablet 0   triamcinolone cream (KENALOG) 0.1 % Apply 1 application. topically 2 (two) times daily. 30 g 0   valsartan (DIOVAN) 160 MG tablet Take 2 tablets (320 mg total) by mouth daily. 180 tablet 3   Vitamin D, Ergocalciferol, (DRISDOL) 1.25 MG (50000 UNIT) CAPS capsule TAKE 1 CAPSULE (50,000 UNITS TOTAL) BY MOUTH EVERY 7 (SEVEN) DAYS 4 capsule 2   zolpidem (AMBIEN) 10 MG tablet Take one tablet by mouth at night as needed for insomnia 30 tablet 2   No current facility-administered medications for this visit.   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  Tobacco Use   Smoking status: Every Day    Packs/day: 0.25    Types: Cigarettes   Smokeless tobacco: Never  Substance and Sexual Activity   Alcohol use: No   Drug use: No   Sexual activity: Yes    Partners: Male    Birth control/protection: None  Other Topics Concern   Not on file  Social History Narrative   Not on file   Social Determinants of Health   Financial Resource Strain: Low Risk    Difficulty  of Paying Living Expenses: Not hard at all  Food Insecurity: No Food Insecurity   Worried About Charity fundraiser in the Last Year: Never true   [place] in the Last Year: Never true  Transportation Needs: No Transportation Needs   Lack of Transportation (Medical): No   Lack of Transportation (Non-Medical): No  Physical Activity: Insufficiently Active   Days of Exercise per Week: 2 days   Minutes of Exercise per Session: 20 min  Stress: No Stress Concern Present   Feeling of Stress : Not at all  Social Connections: Moderately Isolated   Frequency of Communication with Friends and Family: More than three times a week   Frequency of Social Gatherings with Friends and Family: Three times a week   Attends Religious Services: Never   Active Member of Clubs or Organizations: No   Attends Archivist Meetings: Never   Marital Status:  Married   Lives in a 44 year old house. Smoking: 1/4 pack per day Occupation: florist  Environmental HistoryFreight forwarder in the house: yes Carpet in the family room: no Carpet in the bedroom: yes Heating: gas Cooling: central Pet: yes 2 dogs x 3 yrs, x <1 yr  Family History: Family History  Problem Relation Age of Onset   Stroke Mother    Hypertension Mother    Diabetes Mother        type 1   Heart disease Mother    Hypertension Father    Diabetes Father        Type 2   Cancer Maternal Grandmother        Ovarian   Heart disease Maternal Grandmother    Hypertension Maternal Grandmother    Diabetes Maternal Grandmother        Type 1   Kidney disease Maternal Grandmother    Diabetes Maternal Grandfather        Type 2   Other Maternal Grandfather        ITP   Hypotension Maternal Grandfather    Diabetes Paternal Grandmother        Type 2   Hypertension Paternal Grandmother    Cancer Paternal Grandfather        Bladder   Hypertension Paternal Grandfather    Problem                               Relation Asthma                                    no Eczema                                no Food allergy                          no Allergic rhino conjunctivitis     no  Review of Systems  Constitutional:  Negative for appetite change, chills, fever and unexpected weight change.  HENT:  Positive for postnasal drip. Negative for congestion and rhinorrhea.   Eyes:  Negative for itching.  Respiratory:  Positive for shortness of breath. Negative for cough, chest tightness and wheezing.   Cardiovascular:  Negative for chest pain.  Gastrointestinal:  Negative for abdominal pain.  Genitourinary:  Negative for difficulty urinating.  Skin:  Positive for rash.  Allergic/Immunologic: Negative for environmental allergies and food allergies.  Neurological:  Negative for headaches.   Objective: BP 130/88   Pulse (!) (P) 56   Temp 98.4 F (36.9 C)   Resp 18   Ht 5' 6.54" (1.69 m)   Wt 264 lb 12.8 oz (120.1 kg)   SpO2 (P) 97%   BMI 42.06 kg/m  Body mass index is 42.06 kg/m. Physical Exam Vitals and nursing note reviewed.  Constitutional:      Appearance: Normal appearance. She is well-developed.  HENT:     Head: Normocephalic and atraumatic.     Right Ear: Tympanic membrane and external ear normal.     Left Ear: Tympanic membrane and external ear normal.     Nose: Nose normal.     Mouth/Throat:     Mouth: Mucous membranes are moist.     Pharynx: Oropharynx is clear.  Eyes:     Conjunctiva/sclera: Conjunctivae normal.  Cardiovascular:     Rate and Rhythm: Normal rate and regular rhythm.     Heart sounds: Normal heart sounds. No murmur heard.   No friction rub. No gallop.  Pulmonary:     Effort: Pulmonary effort is normal.     Breath sounds: Normal breath sounds. No wheezing, rhonchi or rales.  Musculoskeletal:     Cervical back: Neck supple.  Skin:    General: Skin is warm.     Findings: Rash present.     Comments: One circular hive on right upper arm  Neurological:     Mental Status: She  is alert and oriented to person, place, and time.  Psychiatric:        Behavior: Behavior normal.  The plan was reviewed with the patient/family, and all questions/concerned were addressed.  It was my pleasure to see Amanda Barrett today and participate in her care. Please feel free to contact me with any questions or concerns.  Sincerely,  Rexene Alberts, DO Allergy & Immunology  [facility] office: [place] office: [phone]

## 2022-02-23 ENCOUNTER — Other Ambulatory Visit: Payer: Self-pay

## 2022-02-23 ENCOUNTER — Ambulatory Visit: Payer: Commercial Managed Care - PPO | Admitting: Allergy

## 2022-02-23 ENCOUNTER — Encounter: Payer: Self-pay | Admitting: Allergy

## 2022-02-23 VITALS — BP 130/88 | Temp 98.4°F | Resp 18 | Ht 66.54 in | Wt 264.8 lb

## 2022-02-23 DIAGNOSIS — J310 Chronic rhinitis: Secondary | ICD-10-CM | POA: Insufficient documentation

## 2022-02-23 DIAGNOSIS — T783XXD Angioneurotic edema, subsequent encounter: Secondary | ICD-10-CM | POA: Diagnosis not present

## 2022-02-23 DIAGNOSIS — T783XXA Angioneurotic edema, initial encounter: Secondary | ICD-10-CM | POA: Insufficient documentation

## 2022-02-23 DIAGNOSIS — L509 Urticaria, unspecified: Secondary | ICD-10-CM | POA: Diagnosis not present

## 2022-02-23 DIAGNOSIS — R0602 Shortness of breath: Secondary | ICD-10-CM | POA: Diagnosis not present

## 2022-02-23 NOTE — Assessment & Plan Note (Signed)
Initially broke out on April 15th on her chest which then traveled to her abdominal area.  Denies any changes in diet, personal care products.  She was on some type of antibiotics for enlarged lymph nodes. 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  She never had issues with flowers previously.  Currently on Zyrtec 10 mg twice daily and famotidine 20 mg twice a day with good benefit.  Slightly elevated AST and ALT.  Concerned about allergic triggers.  Today's skin prick testing showed: Negative to indoor/outdoor allergens and common foods.   Start zyrtec (cetirizine) '10mg'$  twice a day.  If symptoms are not controlled or causes drowsiness let us know.  Start pepcid (famotidine) '20mg'$  twice a day.  . Avoid the following potential triggers: alcohol, tight clothing, NSAIDs, hot showers and getting overheated. . Get bloodwork to rule out other etiologies.

## 2022-02-23 NOTE — Assessment & Plan Note (Signed)
Noticed some shortness of breath episodes and uses albuterol on rare occasions. This improved with starting zyrtec and famotidine.  Today's spirometry was normal. . May use albuterol rescue inhaler 2 puffs every 4 to 6 hours as needed for shortness of breath, chest tightness, coughing, and wheezing. Monitor frequency of use.

## 2022-02-23 NOTE — Assessment & Plan Note (Signed)
Noted few episodes of lip angioedema with the above hives. . See assessment and plan as above.

## 2022-02-23 NOTE — Patient Instructions (Addendum)
Today's skin testing showed: Negative to indoor/outdoor allergens and common foods.   Results given.  Hives: Start zyrtec (cetirizine) '10mg'$  twice a day. If symptoms are not controlled or causes drowsiness let us know. Start pepcid (famotidine) '20mg'$  twice a day.  Avoid the following potential triggers: alcohol, tight clothing, NSAIDs, hot showers and getting overheated. Get bloodwork:  We are ordering labs, so please allow 1-2 weeks for the results to come back. With the newly implemented Cures Act, the labs might be visible to you at the same time that they become visible to me. However, I will not address the results until all of the results are back, so please be patient.    Rhinitis Use Flonase (fluticasone) nasal spray 1 spray per nostril twice a day as needed for nasal congestion.  Nasal saline spray (i.e., Simply Saline) or nasal saline lavage (i.e., NeilMed) is recommended as needed and prior to medicated nasal sprays. Use over the counter antihistamines such as Zyrtec (cetirizine), Claritin (loratadine), Allegra (fexofenadine), or Xyzal (levocetirizine) daily as needed. May switch antihistamines every few months.  Breathing Normal breathing test today. May use albuterol rescue inhaler 2 puffs every 4 to 6 hours as needed for shortness of breath, chest tightness, coughing, and wheezing. Monitor frequency of use.   Follow up in 2 months or sooner if needed.

## 2022-02-23 NOTE — Assessment & Plan Note (Signed)
Some rhinoconjunctivitis symptoms in the spring and fall.  Takes over-the-counter antihistamines and Flonase as needed with good benefit.  Usually gets 2 sinus infections per year.  Today's skin prick testing showed: Negative to indoor/outdoor allergens.   Use Flonase (fluticasone) nasal spray 1 spray per nostril twice a day as needed for nasal congestion.   Nasal saline spray (i.e., Simply Saline) or nasal saline lavage (i.e., NeilMed) is recommended as needed and prior to medicated nasal sprays.  Use over the counter antihistamines such as Zyrtec (cetirizine), Claritin (loratadine), Allegra (fexofenadine), or Xyzal (levocetirizine) daily as needed. May switch antihistamines every few months.  Keep track of sinus infections.

## 2022-02-24 ENCOUNTER — Encounter: Payer: Self-pay | Admitting: Allergy

## 2022-03-04 LAB — CHRONIC URTICARIA: cu index: 6.4 (ref <10)

## 2022-03-04 LAB — COMPREHENSIVE METABOLIC PANEL
ALT: 46 IU/L — ABNORMAL HIGH (ref 0–32)
AST: 29 IU/L (ref 0–40)
Albumin/Globulin Ratio: 1.9 (ref 1.2–2.2)
Albumin: 4.3 g/dL (ref 3.8–4.8)
Alkaline Phosphatase: 84 IU/L (ref 44–121)
BUN/Creatinine Ratio: 11 (ref 9–23)
BUN: 11 mg/dL (ref 6–24)
Bilirubin Total: <0.2 mg/dL (ref 0.0–1.2)
CO2: 22 mmol/L (ref 20–29)
Calcium: 9.4 mg/dL (ref 8.7–10.2)
Chloride: 105 mmol/L (ref 96–106)
Creatinine, Ser: 0.99 mg/dL (ref 0.57–1.00)
Globulin, Total: 2.3 g/dL (ref 1.5–4.5)
Glucose: 93 mg/dL (ref 70–99)
Potassium: 3.4 mmol/L — ABNORMAL LOW (ref 3.5–5.2)
Sodium: 143 mmol/L (ref 134–144)
Total Protein: 6.6 g/dL (ref 6.0–8.5)
eGFR: 73 mL/min/{1.73_m2} (ref >59)

## 2022-03-04 LAB — ALLERGEN HYMENOPTERA PANEL
Bumblebee: <0.1 kU/L
Honeybee IgE: <0.1 kU/L
Hornet, White Face, IgE: <0.1 kU/L
Hornet, Yellow, IgE: <0.1 kU/L
Paper Wasp IgE: <0.1 kU/L
Yellow Jacket, IgE: <0.1 kU/L

## 2022-03-04 LAB — ALLERGEN FIRE ANT: I070-IgE Fire Ant (Invicta): <0.1 kU/L

## 2022-03-04 LAB — C3 AND C4
Complement C3, Serum: 121 mg/dL (ref 82–167)
Complement C4, Serum: 21 mg/dL (ref 12–38)

## 2022-03-04 LAB — THYROID CASCADE PROFILE: TSH: 2.33 u[IU]/mL (ref 0.450–4.500)

## 2022-03-04 LAB — C-REACTIVE PROTEIN: CRP: 7 mg/L (ref 0–10)

## 2022-03-04 LAB — C1 ESTERASE INHIBITOR: C1INH SerPl-mCnc: 29 mg/dL (ref 21–39)

## 2022-03-04 LAB — ALPHA-GAL PANEL
Allergen Lamb IgE: <0.1 kU/L
Beef IgE: <0.1 kU/L
IgE (Immunoglobulin E), Serum: 6 IU/mL (ref 6–495)
O215-IgE Alpha-Gal: <0.1 kU/L
Pork IgE: <0.1 kU/L

## 2022-03-04 LAB — COMPLEMENT COMPONENT C1Q: Complement C1Q: 13 mg/dL (ref 10.3–20.5)

## 2022-03-04 LAB — C1 ESTERASE INHIBITOR, FUNCTIONAL: C1INH Functional/C1INH Total MFr SerPl: 90 %mean normal

## 2022-03-04 LAB — TRYPTASE: Tryptase: 3.2 ug/L (ref 2.2–13.2)

## 2022-03-04 LAB — SEDIMENTATION RATE: Sed Rate: 4 mm/hr (ref 0–32)

## 2022-03-04 LAB — ANA W/REFLEX: Anti Nuclear Antibody (ANA): NEGATIVE

## 2022-03-10 ENCOUNTER — Other Ambulatory Visit: Payer: Self-pay

## 2022-03-10 ENCOUNTER — Ambulatory Visit: Payer: Commercial Managed Care - PPO | Admitting: Podiatry

## 2022-03-10 ENCOUNTER — Encounter: Payer: Self-pay | Admitting: Podiatry

## 2022-03-10 ENCOUNTER — Encounter (HOSPITAL_BASED_OUTPATIENT_CLINIC_OR_DEPARTMENT_OTHER): Payer: Self-pay | Admitting: Obstetrics and Gynecology

## 2022-03-10 ENCOUNTER — Emergency Department (HOSPITAL_BASED_OUTPATIENT_CLINIC_OR_DEPARTMENT_OTHER)
Admission: EM | Admit: 2022-03-10 | Discharge: 2022-03-11 | Disposition: A | Discharge status: Home / Self Care | Payer: Commercial Managed Care - PPO | Attending: Emergency Medicine | Admitting: Emergency Medicine

## 2022-03-10 DIAGNOSIS — I10 Essential (primary) hypertension: Secondary | ICD-10-CM | POA: Diagnosis not present

## 2022-03-10 DIAGNOSIS — L600 Ingrowing nail: Secondary | ICD-10-CM | POA: Diagnosis not present

## 2022-03-10 DIAGNOSIS — T783XXA Angioneurotic edema, initial encounter: Secondary | ICD-10-CM | POA: Diagnosis not present

## 2022-03-10 DIAGNOSIS — Z7951 Long term (current) use of inhaled steroids: Secondary | ICD-10-CM | POA: Diagnosis not present

## 2022-03-10 DIAGNOSIS — Z79899 Other long term (current) drug therapy: Secondary | ICD-10-CM | POA: Diagnosis not present

## 2022-03-10 DIAGNOSIS — J45909 Unspecified asthma, uncomplicated: Secondary | ICD-10-CM | POA: Diagnosis not present

## 2022-03-10 DIAGNOSIS — T7840XA Allergy, unspecified, initial encounter: Secondary | ICD-10-CM | POA: Diagnosis present

## 2022-03-10 MED ORDER — FAMOTIDINE 20 MG PO TABS
20.0000 mg | ORAL_TABLET | Freq: Once | ORAL | Status: AC
  Administered 2022-03-10: 20 mg via ORAL
  Filled 2022-03-10: qty 1

## 2022-03-10 MED ORDER — PREDNISONE 50 MG PO TABS
60.0000 mg | ORAL_TABLET | Freq: Once | ORAL | Status: AC
  Administered 2022-03-10: 60 mg via ORAL
  Filled 2022-03-10: qty 1

## 2022-03-10 NOTE — Patient Instructions (Signed)

## 2022-03-10 NOTE — ED Triage Notes (Signed)
Patient reports to the ER for oral swelling and allergic reaction to unknown source. Patient took liquid benadryl around 7pm and then used her epi-pen around 9pm. Patient has obvious swelling to her lips and face. Patient denies trouble breathing and is speaking in complete sentences. Patient reports she has concerns that she will have a reaction in her sleep and be unable to breathe.

## 2022-03-10 NOTE — Progress Notes (Signed)
Subjective:   Patient ID: Amanda Barrett, female   DOB: 46 y.o.   MRN: [id number]   HPI Patient presents stating she had a painful ingrown toenail of her right big toe.  States she is worked on the left along with the right one is really been bothering her and the left is done very well with the surgery   ROS      Objective:  Physical Exam  Neurovascular status intact incurvated medial border right hallux painful when pressed     Assessment:  Ingrown toenail deformity right hallux medial border with pain     Plan:  H&P reviewed condition recommended correction of deformity allowed her to read then signed consent form understanding risk and today I infiltrated the right hallux 60 mg like Marcaine mixture sterile prep done using sterile instrumentation remove the medial border exposed matrix applied phenol 3 applications 30 seconds followed by alcohol by sterile dressing gave instructions on soaks leave dressing on 24 hours take it off earlier if throbbing were to occur encouraged to call questions concerns

## 2022-03-11 ENCOUNTER — Encounter: Payer: Self-pay | Admitting: Nurse Practitioner

## 2022-03-11 ENCOUNTER — Other Ambulatory Visit: Payer: Self-pay | Admitting: Nurse Practitioner

## 2022-03-11 DIAGNOSIS — I10 Essential (primary) hypertension: Secondary | ICD-10-CM

## 2022-03-11 MED ORDER — NIFEDIPINE ER OSMOTIC RELEASE 60 MG PO TB24: 60.0000 mg | ORAL_TABLET | Freq: Every day | ORAL | 1 refills | Status: DC

## 2022-03-11 MED ORDER — EPINEPHRINE 0.3 MG/0.3ML IJ SOAJ: 0.3000 mg | INTRAMUSCULAR | 1 refills | Status: AC | PRN

## 2022-03-11 MED ORDER — PREDNISONE 10 MG PO TABS: 40.0000 mg | ORAL_TABLET | Freq: Every day | ORAL | 0 refills | Status: AC

## 2022-03-11 NOTE — ED Provider Notes (Signed)
[facility] EMERGENCY DEPT Provider Note  CSN: [id number] Arrival date & time: 03/10/22 2238  Chief Complaint(s) Allergic Reaction  HPI Amanda Barrett is a 46 y.o. female with a past medical history listed below who presents to the emergency department with facial swelling that began around 7 PM this afternoon.  This improved after taking 50 mg of Benadryl around 7 PM and using the EpiPen around 9 PM.  She has no associated shortness of breath, stridor, difficulty breathing or swallowing.   Patient does have hives which she has had for the past 2 months.  In that timeframe she is also had intermittent mild facial swelling that has improved with Benadryl or over-the-counter allergy medicine.  Patient developed urticaria around April 15.  Around that time patient had been started on Augmentin for right-sided neck lymphadenopathy.  She was on her last day of antibiotic course and symptoms began.  Patient was evaluated by her PCP and prescribed an EpiPen, daily Zyrtec, twice daily Pepcid, and as needed Benadryl.  She was referred to allergist who performed skin and blood testing, which were unrevealing.  The history is provided by the patient.    Past Medical History Past Medical History:  Diagnosis Date   Anemia    Pernicious   Angio-edema    Hypertension    Recurrent upper respiratory infection (URI)    Urticaria    Patient Active Problem List   Diagnosis Date Noted   Urticaria 02/23/2022   Angio-edema 02/23/2022   Shortness of breath 02/23/2022   Chronic rhinitis 02/23/2022   Uterine leiomyoma 05/20/2021   Primary insomnia 05/20/2021   Allergic rhinitis 05/20/2021   Asthma 05/20/2021   Tobacco use disorder, continuous 05/20/2021   Essential hypertension 09/14/2018   Home Medication(s) Prior to Admission medications   Medication Sig Start Date End Date Taking? Authorizing Provider  EPINEPHrine 0.3 mg/0.3 mL IJ SOAJ injection Inject 0.3 mg into the muscle as  needed for anaphylaxis. 03/11/22  Yes Cletis Clack, Grayce Sessions, MD  predniSONE (DELTASONE) 10 MG tablet Take 4 tablets (40 mg total) by mouth daily for 4 days. 03/11/22 03/15/22 Yes Nanako Stopher, Grayce Sessions, MD  albuterol [facility] HFA) 108 (90 Base) MCG/ACT inhaler Inhale 2 puffs into the lungs every 4 (four) hours as needed for wheezing or shortness of breath. 09/10/20   Cirigliano, Garvin Fila, DO  buPROPion (WELLBUTRIN SR) 150 MG 12 hr tablet TAKE 1 TABLET BY MOUTH TWICE A DAY 10/22/21   Rip Harbour, NP  carvedilol (COREG) 25 MG tablet TAKE 1 TABLET BY MOUTH 2 TIMES DAILY WITH A MEAL. 01/25/22   Rip Harbour, NP  cetirizine (ZYRTEC) 10 MG tablet Take 1 tablet (10 mg total) by mouth daily. 02/02/22   Rip Harbour, NP  EPINEPHrine (EPIPEN 2-PAK) 0.3 mg/0.3 mL IJ SOAJ injection Inject 0.3 mg into the muscle as needed for anaphylaxis. 02/02/22   Rip Harbour, NP  famotidine (PEPCID) 20 MG tablet Take 1 tablet (20 mg total) by mouth 2 (two) times daily. 01/25/22   Rip Harbour, NP  fluticasone (FLONASE) 50 MCG/ACT nasal spray Place 2 sprays into both nostrils daily. 09/10/20   Cirigliano, Mary K, DO  KLOR-CON M10 10 MEQ tablet TAKE 1 TABLET BY MOUTH 2 TIMES DAILY. 01/28/22   Rip Harbour, NP  NIFEdipine (PROCARDIA-XL/NIFEDICAL-XL) 30 MG 24 hr tablet TAKE 1 TABLET BY MOUTH EVERY DAY 02/15/22   Rip Harbour, NP  triamcinolone cream (KENALOG) 0.1 % Apply 1 application. topically 2 (two)  times daily. 02/02/22   Rip Harbour, NP  valsartan (DIOVAN) 160 MG tablet Take 2 tablets (320 mg total) by mouth daily. 01/13/22   Rip Harbour, NP  Vitamin D, Ergocalciferol, (DRISDOL) 1.25 MG (50000 UNIT) CAPS capsule TAKE 1 CAPSULE (50,000 UNITS TOTAL) BY MOUTH EVERY 7 (SEVEN) DAYS 12/15/21   Rip Harbour, NP  zolpidem (AMBIEN) 10 MG tablet Take one tablet by mouth at night as needed for insomnia 01/13/22   Rip Harbour, NP                                                                                                                                     Allergies Patient has no known allergies.  Review of Systems Review of Systems As noted in HPI  Physical Exam Vital Signs  I have reviewed the triage vital signs BP 130/79   Pulse (!) 59   Temp 98.9 F (37.2 C)   Resp 12   Ht '5\' 7"'$  (1.702 m)   Wt 113.4 kg   LMP 03/03/2022 (Approximate)   SpO2 99%   BMI 39.16 kg/m   Physical Exam Vitals reviewed.  Constitutional:      General: She is not in acute distress.    Appearance: She is well-developed. She is not diaphoretic.  HENT:     Head: Normocephalic and atraumatic.     Comments: Angioedema to face and upper lip.    Nose: Nose normal.     Mouth/Throat:     Mouth: No angioedema.     Pharynx: No uvula swelling.  Eyes:     General: No scleral icterus.       Right eye: No discharge.        Left eye: No discharge.     Conjunctiva/sclera: Conjunctivae normal.     Pupils: Pupils are equal, round, and reactive to light.  Cardiovascular:     Rate and Rhythm: Normal rate and regular rhythm.     Heart sounds: No murmur heard.    No friction rub. No gallop.  Pulmonary:     Effort: Pulmonary effort is normal. No respiratory distress.     Breath sounds: Normal breath sounds. No stridor. No rales.  Abdominal:     General: There is no distension.     Palpations: Abdomen is soft.     Tenderness: There is no abdominal tenderness.  Musculoskeletal:        General: No tenderness.     Cervical back: Normal range of motion and neck supple.  Skin:    General: Skin is warm and dry.     Findings: Rash present. No erythema. Rash is urticarial.  Neurological:     Mental Status: She is alert and oriented to person, place, and time.     ED Results and Treatments Labs (all labs ordered are listed, but only abnormal results are displayed) Labs Reviewed - No data to display  EKG  EKG Interpretation  Date/Time:    Ventricular Rate:    PR Interval:    QRS Duration:   QT Interval:    QTC Calculation:   R Axis:     Text Interpretation:         Radiology No results found.  Pertinent labs & imaging results that were available during my care of the patient were reviewed by me and considered in my medical decision making (see MDM for details).  Medications Ordered in ED Medications  famotidine (PEPCID) tablet 20 mg (20 mg Oral Given 03/10/22 2331)  predniSONE (DELTASONE) tablet 60 mg (60 mg Oral Given 03/10/22 2331)                                                                                                                                     Procedures Procedures  (including critical care time)  Medical Decision Making / ED Course    Complexity of Problem:  Co-morbidities/SDOH that complicate the patient evaluation/care: Noted in HPI  Additional history obtained: Previous PCP and allergy clinic visits noted above. Additionally patient had a PCP visit on April 12, where her valsartan dose was increased.  This was 3 days prior to symptom onset.  Patient's presenting problem/concern, DDX, and MDM listed below: Angioedema and urticaria Patient was previously ruled out for common indoor/outdoor allergies, complement induced angioedema. Specific trigger is unknown at this time but considering Augmentin versus valsartan.  Given that her symptoms have continued and progressed despite being off of Augmentin for over a month and I have, I feel that Augmentin is less likely the source.  I am more suspicious for valsartan as her trigger.  Hospitalization Considered:  Yes if she develops oral swelling.  Initial Intervention:  Provided with nighttime dose of Pepcid and oral prednisone. We will continue to monitor    Complexity of Data:   Cardiac Monitoring: The patient was maintained on a cardiac monitor.   I personally viewed and  interpreted the cardiac monitored which showed an underlying rhythm of normal sinus rhythm   Laboratory Tests ordered listed below with my independent interpretation: Deferred for now   Imaging Studies ordered listed below with my independent interpretation: Deferred for now     ED Course:    Assessment, Add'l Intervention, and Reassessment: Hives and angioedema Patient's facial swelling and angioedema had improved.  There was no progression into the intraoral cavity and patient did not develop any respiratory symptoms requiring admission or further evaluation. She will be prescribed short course of high-dose prednisone. Recommended continued Zyrtec, Pepcid, as needed Benadryl use. She was given an additional prescription for EpiPen. Recommended she discuss with her PCP the possibility that her symptoms are related to valsartan and arrange for either a medicine holiday or change in regimen.   Final Clinical Impression(s) / ED Diagnoses Final diagnoses:  Angioedema, initial encounter   The patient appears reasonably screened and/or stabilized  for discharge and I doubt any other medical condition or other [facility] requiring further screening, evaluation, or treatment in the ED at this time prior to discharge. Safe for discharge with strict return precautions.  Disposition: Discharge  Condition: Good  I have discussed the results, Dx and Tx plan with the patient/family who expressed understanding and agree(s) with the plan. Discharge instructions discussed at length. The patient/family was given strict return precautions who verbalized understanding of the instructions. No further questions at time of discharge.    ED Discharge Orders          Ordered    predniSONE (DELTASONE) 10 MG tablet  Daily        03/11/22 0006    EPINEPHrine 0.3 mg/0.3 mL IJ SOAJ injection  As needed        03/11/22 0036            Follow Up: Rip Harbour, NP [place] [id number] [phone]  Call  to schedule an appointment for close follow up           This chart was dictated using voice recognition software.  Despite best efforts to proofread,  errors can occur which can change the documentation meaning.    Fatima Blank, MD 03/11/22 [phone]

## 2022-03-15 ENCOUNTER — Other Ambulatory Visit: Payer: Self-pay | Admitting: Nurse Practitioner

## 2022-03-15 DIAGNOSIS — I10 Essential (primary) hypertension: Secondary | ICD-10-CM

## 2022-03-16 ENCOUNTER — Other Ambulatory Visit: Payer: Self-pay | Admitting: Nurse Practitioner

## 2022-03-26 ENCOUNTER — Encounter: Payer: Self-pay | Admitting: Physician Assistant

## 2022-03-26 ENCOUNTER — Ambulatory Visit: Payer: Commercial Managed Care - PPO | Admitting: Physician Assistant

## 2022-03-26 VITALS — BP 130/70 | HR 68 | Temp 98.7°F | Resp 18 | Ht 66.54 in | Wt 267.0 lb

## 2022-03-26 DIAGNOSIS — L509 Urticaria, unspecified: Secondary | ICD-10-CM

## 2022-03-26 DIAGNOSIS — I10 Essential (primary) hypertension: Secondary | ICD-10-CM

## 2022-03-26 MED ORDER — PREDNISONE 20 MG PO TABS: ORAL_TABLET | ORAL | 0 refills | Status: AC

## 2022-03-26 MED ORDER — TRIAMCINOLONE ACETONIDE 40 MG/ML IJ SUSP
80.0000 mg | Freq: Once | INTRAMUSCULAR | Status: AC
  Administered 2022-03-26: 80 mg via INTRAMUSCULAR

## 2022-04-05 ENCOUNTER — Encounter: Payer: Self-pay | Admitting: Nurse Practitioner

## 2022-04-05 ENCOUNTER — Ambulatory Visit: Payer: BC Managed Care – PPO | Admitting: Nurse Practitioner

## 2022-04-05 VITALS — BP 154/104 | HR 68 | Temp 97.2°F | Ht 66.5 in | Wt 267.0 lb

## 2022-04-05 DIAGNOSIS — L509 Urticaria, unspecified: Secondary | ICD-10-CM

## 2022-04-05 DIAGNOSIS — I10 Essential (primary) hypertension: Secondary | ICD-10-CM | POA: Diagnosis not present

## 2022-04-05 DIAGNOSIS — Z87898 Personal history of other specified conditions: Secondary | ICD-10-CM | POA: Diagnosis not present

## 2022-04-05 MED ORDER — CLONIDINE HCL 0.2 MG PO TABS: 0.2000 mg | ORAL_TABLET | Freq: Two times a day (BID) | ORAL | 0 refills | Status: DC

## 2022-04-05 NOTE — Progress Notes (Signed)
Subjective:  Patient ID: Amanda Barrett, female    DOB: 1975-11-22  Age: 46 y.o. MRN: [id number]  CC: Hypertension  HPI:  Amanda Barrett presents for evaluation of uncontrolled hypertension and f/u urticaria.She experienced angioedema with Valsartan on 03/10/22, was seen at ED. She developed hives after stopping Valsartan. Previously prescribed Amlodipine, Labetalol, and HCTZ for hypertension. She denies cp, dizziness, or headaches. She is currently taking Coreg 25 mg BID, Pepcid, Zyrtec, and Benadryl PRN. States she is reluctant to begin a new antihypertensive due to return of urticaria. She had allergy work-up that was negative.     Hypertension, follow-up: She was last seen for hypertension 1 weeks ago.  BP at that visit was 130/70. Management includes Coreg 25 mg BID.  She reports excellent compliance with treatment. She is not having side effects.  She is following a Regular diet. She is not exercising. She does not smoke.  Use of agents associated with hypertension: none.   Outside blood pressures are this a.m. was 156/91  Pertinent labs: Lab Results  Component Value Date   CHOL 189 01/13/2022   HDL 56 01/13/2022   LDLCALC 120 (H) 01/13/2022   TRIG 73 01/13/2022   CHOLHDL 3.4 01/13/2022   Lab Results  Component Value Date   NA 143 02/25/2022   K 3.4 (L) 02/25/2022   CREATININE 0.99 02/25/2022   EGFR 73 02/25/2022   GLUCOSE 93 02/25/2022     The 10-year ASCVD risk score (Arnett DK, et al., 2019) is: 3.1%   Current Outpatient Medications on File Prior to Visit  Medication Sig Dispense Refill   albuterol (PROAIR HFA) 108 (90 Base) MCG/ACT inhaler Inhale 2 puffs into the lungs every 4 (four) hours as needed for wheezing or shortness of breath. 1 each 2   buPROPion (WELLBUTRIN SR) 150 MG 12 hr tablet TAKE 1 TABLET BY MOUTH TWICE A DAY 180 tablet 3   carvedilol (COREG) 25 MG tablet TAKE 1 TABLET BY MOUTH 2 TIMES DAILY WITH A MEAL. 90 tablet 2   cetirizine (ZYRTEC) 10 MG  tablet Take 1 tablet (10 mg total) by mouth daily. 180 tablet 2   EPINEPHrine 0.3 mg/0.3 mL IJ SOAJ injection Inject 0.3 mg into the muscle as needed for anaphylaxis. 1 each 1   famotidine (PEPCID) 20 MG tablet Take 1 tablet (20 mg total) by mouth 2 (two) times daily. 90 tablet 1   fluticasone (FLONASE) 50 MCG/ACT nasal spray Place 2 sprays into both nostrils daily. 16 g 0   KLOR-CON M10 10 MEQ tablet TAKE 1 TABLET BY MOUTH 2 TIMES DAILY. 60 tablet 3   triamcinolone cream (KENALOG) 0.1 % Apply 1 application. topically 2 (two) times daily. 30 g 0   Vitamin D, Ergocalciferol, (DRISDOL) 1.25 MG (50000 UNIT) CAPS capsule TAKE 1 CAPSULE (50,000 UNITS TOTAL) BY MOUTH EVERY 7 (SEVEN) DAYS 4 capsule 2   zolpidem (AMBIEN) 10 MG tablet Take one tablet by mouth at night as needed for insomnia 30 tablet 2   No current facility-administered medications on file prior to visit.   Past Medical History:  Diagnosis Date   Anemia    Pernicious   Angio-edema    Hypertension    Recurrent upper respiratory infection (URI)    Urticaria    Past Surgical History:  Procedure Laterality Date   ADENOIDECTOMY     MYOMECTOMY     Uterine Fibroids   SINOSCOPY     SINUS EXPLORATION     TONSILLECTOMY  Family History  Problem Relation Age of Onset   Stroke Mother    Hypertension Mother    Diabetes Mother        type 1   Heart disease Mother    Hypertension Father    Diabetes Father        Type 2   Cancer Maternal Grandmother        Ovarian   Heart disease Maternal Grandmother    Hypertension Maternal Grandmother    Diabetes Maternal Grandmother        Type 1   Kidney disease Maternal Grandmother    Diabetes Maternal Grandfather        Type 2   Other Maternal Grandfather        ITP   Hypotension Maternal Grandfather    Diabetes Paternal Grandmother        Type 2   Hypertension Paternal Grandmother    Cancer Paternal Grandfather        Bladder   Hypertension Paternal Grandfather    Social  History   Socioeconomic History   Marital status: Married    Spouse name: Gus Designer, industrial/product   Number of children: Not on file   Years of education: Not on file   Highest education level: Not on file  Occupational History   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  Tobacco Use   Smoking status: Every Day    Packs/day: 0.25    Years: 20.00    Total pack years: 5.00    Types: Cigarettes    Passive exposure: Current   Smokeless tobacco: Never  Vaping Use   Vaping Use: Never used  Substance and Sexual Activity   Alcohol use: No   Drug use: No   Sexual activity: Yes    Partners: Male    Birth control/protection: None  Other Topics Concern   Not on file  Social History Narrative   Not on file   Social Determinants of Health   Financial Resource Strain: Low Risk  (09/15/2021)   Overall Financial Resource Strain (CARDIA)    Difficulty of Paying Living Expenses: Not hard at all  Food Insecurity: No Food Insecurity (09/15/2021)   Hunger Vital Sign    Worried About Running Out of Food in the Last Year: Never true    Ran Out of Food in the Last Year: Never true  Transportation Needs: No Transportation Needs (09/15/2021)   PRAPARE - Hydrologist (Medical): No    Lack of Transportation (Non-Medical): No  Physical Activity: Insufficiently Active (09/15/2021)   Exercise Vital Sign    Days of Exercise per Week: 2 days    Minutes of Exercise per Session: 20 min  Stress: No Stress Concern Present (09/15/2021)   [place]    Feeling of Stress : Not at all  Social Connections: Moderately Isolated (09/15/2021)   Social Connection and Isolation Panel [NHANES]    Frequency of Communication with Friends and Family: More than three times a week    Frequency of Social Gatherings with Friends and Family: Three times a week    Attends Religious Services: Never    Active Member of  Clubs or Organizations: No    Attends Archivist Meetings: Never    Marital Status: Married    Review of Systems  Constitutional:  Negative for chills and fever.  Respiratory:  Negative for cough and shortness of breath.   Cardiovascular:  Negative for chest pain.  Neurological:  Negative for dizziness and headaches.     Objective:  BP (!) 154/104   Pulse 68   Temp (!) 97.2 F (36.2 C)   Ht 5' 6.5" (1.689 m)   Wt 267 lb (121.1 kg)   LMP 03/03/2022 (Approximate)   SpO2 97%   BMI 42.45 kg/m       03/26/2022   10:59 AM 03/11/2022   12:32 AM 03/11/2022   12:00 AM  BP/Weight  Systolic BP 334 356 861  Diastolic BP 70 79 91  Wt. (Lbs) 267    BMI 42.4 kg/m2      Physical Exam Vitals reviewed.  Constitutional:      Appearance: She is obese.  Cardiovascular:     Rate and Rhythm: Normal rate and regular rhythm.     Pulses: Normal pulses.     Heart sounds: Normal heart sounds.  Pulmonary:     Effort: Pulmonary effort is normal.     Breath sounds: Normal breath sounds.  Neurological:     Mental Status: She is alert.     Lab Results  Component Value Date   WBC 5.9 01/13/2022   HGB 13.2 01/13/2022   HCT 40.4 01/13/2022   PLT 295 01/13/2022   GLUCOSE 93 02/25/2022   CHOL 189 01/13/2022   TRIG 73 01/13/2022   HDL 56 01/13/2022   LDLCALC 120 (H) 01/13/2022   ALT 46 (H) 02/25/2022   AST 29 02/25/2022   NA 143 02/25/2022   K 3.4 (L) 02/25/2022   CL 105 02/25/2022   CREATININE 0.99 02/25/2022   BUN 11 02/25/2022   CO2 22 02/25/2022   TSH 2.330 02/25/2022   HGBA1C 5.6 01/13/2022      Assessment & Plan:   1. Uncontrolled hypertension - cloNIDine (CATAPRES) 0.2 MG tablet; Take 1 tablet (0.2 mg total) by mouth 2 (two) times daily.  Dispense: 60 tablet; Refill: 0 -continue Coreg 25 mg BID -DASH diet  2. Urticaria -continue Zyrtec 10 mg, Pepcid 20 mg BID, and Benadryl  3. History of angioedema -keep epi pen available     Begin Clonidine 0.2 mg  twice daily Continue Coreg 25 mg twice daily Monitor BP, keep log DASH-low salt diet Seek emergency medical care for any adverse reaction  Return in 04/26/22, bring BP log  Follow-up: 2-weeks   An After Visit Summary was printed and given to the patient.  I, Rip Harbour, NP, have reviewed all documentation for this visit. The documentation on 04/05/22 for the exam, diagnosis, procedures, and orders are all accurate and complete.    Signed, Rip Harbour, NP [place] [phone]

## 2022-04-05 NOTE — Patient Instructions (Addendum)
Begin Clonidine 0.2 mg twice daily Continue Coreg 25 mg twice daily Monitor BP, keep log DASH-low salt diet Seek emergency medical care for any adverse reaction  Return in 2 weeks, bring BP log    Managing Your Hypertension Hypertension, also called high blood pressure, is when the force of the blood pressing against the walls of the arteries is too strong. Arteries are blood vessels that carry blood from your heart throughout your body. Hypertension forces the heart to work harder to pump blood and may cause the arteries to become narrow or stiff. Understanding blood pressure readings A blood pressure reading includes a higher number over a lower number: The first, or top, number is called the systolic pressure. It is a measure of the pressure in your arteries as your heart beats. The second, or bottom number, is called the diastolic pressure. It is a measure of the pressure in your arteries as the heart relaxes. For most people, a normal blood pressure is below 120/80. Your personal target blood pressure may vary depending on your medical conditions, your age, and other factors. Blood pressure is classified into four stages. Based on your blood pressure reading, your health care provider may use the following stages to determine what type of treatment you need, if any. Systolic pressure and diastolic pressure are measured in a unit called millimeters of mercury (mmHg). Normal Systolic pressure: below 878. Diastolic pressure: below 80. Elevated Systolic pressure: 676-720. Diastolic pressure: below 80. Hypertension stage 1 Systolic pressure: 947-096. Diastolic pressure: 28-36. Hypertension stage 2 Systolic pressure: 629 or above. Diastolic pressure: 90 or above. How can this condition affect me? Managing your hypertension is very important. Over time, hypertension can damage the arteries and decrease blood flow to parts of the body, including the brain, heart, and kidneys. Having  untreated or uncontrolled hypertension can lead to: A heart attack. A stroke. A weakened blood vessel (aneurysm). Heart failure. Kidney damage. Eye damage. Memory and concentration problems. Vascular dementia. What actions can I take to manage this condition? Hypertension can be managed by making lifestyle changes and possibly by taking medicines. Your health care provider will help you make a plan to bring your blood pressure within a normal range. You may be referred for counseling on a healthy diet and physical activity. Nutrition  Eat a diet that is high in fiber and potassium, and low in salt (sodium), added sugar, and fat. An example eating plan is called the DASH diet. DASH stands for Dietary Approaches to Stop Hypertension. To eat this way: Eat plenty of fresh fruits and vegetables. Try to fill one-half of your plate at each meal with fruits and vegetables. Eat whole grains, such as whole-wheat pasta, brown rice, or whole-grain bread. Fill about one-fourth of your plate with whole grains. Eat low-fat dairy products. Avoid fatty cuts of meat, processed or cured meats, and poultry with skin. Fill about one-fourth of your plate with lean proteins such as fish, chicken without skin, beans, eggs, and tofu. Avoid pre-made and processed foods. These tend to be higher in sodium, added sugar, and fat. Reduce your daily sodium intake. Many people with hypertension should eat less than 1,500 mg of sodium a day. Lifestyle  Work with your health care provider to maintain a healthy body weight or to lose weight. Ask what an ideal weight is for you. Get at least 30 minutes of exercise that causes your heart to beat faster (aerobic exercise) most days of the week. Activities may include walking, swimming,  or biking. Include exercise to strengthen your muscles (resistance exercise), such as weight lifting, as part of your weekly exercise routine. Try to do these types of exercises for 30 minutes at  least 3 days a week. Do not use any products that contain nicotine or tobacco. These products include cigarettes, chewing tobacco, and vaping devices, such as e-cigarettes. If you need help quitting, ask your health care provider. Control any long-term (chronic) conditions you have, such as high cholesterol or diabetes. Identify your sources of stress and find ways to manage stress. This may include meditation, deep breathing, or making time for fun activities. Alcohol use Do not drink alcohol if: Your health care provider tells you not to drink. You are pregnant, may be pregnant, or are planning to become pregnant. If you drink alcohol: Limit how much you have to: 0-1 drink a day for women. 0-2 drinks a day for men. Know how much alcohol is in your drink. In the [place] one drink equals one 12 oz bottle of beer (355 mL), one 5 oz glass of wine (148 mL), or one 1 oz glass of hard liquor (44 mL). Medicines Your health care provider may prescribe medicine if lifestyle changes are not enough to get your blood pressure under control and if: Your systolic blood pressure is 130 or higher. Your diastolic blood pressure is 80 or higher. Take medicines only as told by your health care provider. Follow the directions carefully. Blood pressure medicines must be taken as told by your health care provider. The medicine does not work as well when you skip doses. Skipping doses also puts you at risk for problems. Monitoring Before you monitor your blood pressure: Do not smoke, drink caffeinated beverages, or exercise within 30 minutes before taking a measurement. Use the bathroom and empty your bladder (urinate). Sit quietly for at least 5 minutes before taking measurements. Monitor your blood pressure at home as told by your health care provider. To do this: Sit with your back straight and supported. Place your feet flat on the floor. Do not cross your legs. Support your arm on a flat surface, such as a  table. Make sure your upper arm is at heart level. Each time you measure, take two or three readings one minute apart and record the results. You may also need to have your blood pressure checked regularly by your health care provider. General information Talk with your health care provider about your diet, exercise habits, and other lifestyle factors that may be contributing to hypertension. Review all the medicines you take with your health care provider because there may be side effects or interactions. Keep all follow-up visits. Your health care provider can help you create and adjust your plan for managing your high blood pressure. Where to find more information National Heart, Lung, and Blood Institute: https://wilson-eaton.com/ American Heart Association: www.heart.org Contact a health care provider if: You think you are having a reaction to medicines you have taken. You have repeated (recurrent) headaches. You feel dizzy. You have swelling in your ankles. You have trouble with your vision. Get help right away if: You develop a severe headache or confusion. You have unusual weakness or numbness, or you feel faint. You have severe pain in your chest or abdomen. You vomit repeatedly. You have trouble breathing. These symptoms may be an emergency. Get help right away. Call 911. Do not wait to see if the symptoms will go away. Do not drive yourself to the hospital. Summary Hypertension is when  the force of blood pumping through your arteries is too strong. If this condition is not controlled, it may put you at risk for serious complications. Your personal target blood pressure may vary depending on your medical conditions, your age, and other factors. For most people, a normal blood pressure is less than 120/80. Hypertension is managed by lifestyle changes, medicines, or both. Lifestyle changes to help manage hypertension include losing weight, eating a healthy, low-sodium diet, exercising  more, stopping smoking, and limiting alcohol. This information is not intended to replace advice given to you by your health care provider. Make sure you discuss any questions you have with your health care provider. Document Revised: 06/04/2021 Document Reviewed: 06/04/2021 Elsevier Patient Education  [place] Eating Plan DASH stands for Dietary Approaches to Stop Hypertension. The DASH eating plan is a healthy eating plan that has been shown to: Reduce high blood pressure (hypertension). Reduce your risk for type 2 diabetes, heart disease, and stroke. Help with weight loss. What are tips for following this plan? Reading food labels Check food labels for the amount of salt (sodium) per serving. Choose foods with less than 5 percent of the Daily Value of sodium. Generally, foods with less than 300 milligrams (mg) of sodium per serving fit into this eating plan. To find whole grains, look for the word "whole" as the first word in the ingredient list. Shopping Buy products labeled as "low-sodium" or "no salt added." Buy fresh foods. Avoid canned foods and pre-made or frozen meals. Cooking Avoid adding salt when cooking. Use salt-free seasonings or herbs instead of table salt or sea salt. Check with your health care provider or pharmacist before using salt substitutes. Do not fry foods. Cook foods using healthy methods such as baking, boiling, grilling, roasting, and broiling instead. Cook with heart-healthy oils, such as olive, canola, avocado, soybean, or sunflower oil. Meal planning  Eat a balanced diet that includes: 4 or more servings of fruits and 4 or more servings of vegetables each day. Try to fill one-half of your plate with fruits and vegetables. 6-8 servings of whole grains each day. Less than 6 oz (170 g) of lean meat, poultry, or fish each day. A 3-oz (85-g) serving of meat is about the same size as a deck of cards. One egg equals 1 oz (28 g). 2-3 servings of  low-fat dairy each day. One serving is 1 cup (237 mL). 1 serving of nuts, seeds, or beans 5 times each week. 2-3 servings of heart-healthy fats. Healthy fats called omega-3 fatty acids are found in foods such as walnuts, flaxseeds, fortified milks, and eggs. These fats are also found in cold-water fish, such as sardines, salmon, and mackerel. Limit how much you eat of: Canned or prepackaged foods. Food that is high in trans fat, such as some fried foods. Food that is high in saturated fat, such as fatty meat. Desserts and other sweets, sugary drinks, and other foods with added sugar. Full-fat dairy products. Do not salt foods before eating. Do not eat more than 4 egg yolks a week. Try to eat at least 2 vegetarian meals a week. Eat more home-cooked food and less restaurant, buffet, and fast food. Lifestyle When eating at a restaurant, ask that your food be prepared with less salt or no salt, if possible. If you drink alcohol: Limit how much you use to: 0-1 drink a day for women who are not pregnant. 0-2 drinks a day for men. Be aware of how much  alcohol is in your drink. In the [place] one drink equals one 12 oz bottle of beer (355 mL), one 5 oz glass of wine (148 mL), or one 1 oz glass of hard liquor (44 mL). General information Avoid eating more than 2,300 mg of salt a day. If you have hypertension, you may need to reduce your sodium intake to 1,500 mg a day. Work with your health care provider to maintain a healthy body weight or to lose weight. Ask what an ideal weight is for you. Get at least 30 minutes of exercise that causes your heart to beat faster (aerobic exercise) most days of the week. Activities may include walking, swimming, or biking. Work with your health care provider or dietitian to adjust your eating plan to your individual calorie needs. What foods should I eat? Fruits All fresh, dried, or frozen fruit. Canned fruit in natural juice (without added  sugar). Vegetables Fresh or frozen vegetables (raw, steamed, roasted, or grilled). Low-sodium or reduced-sodium tomato and vegetable juice. Low-sodium or reduced-sodium tomato sauce and tomato paste. Low-sodium or reduced-sodium canned vegetables. Grains Whole-grain or whole-wheat bread. Whole-grain or whole-wheat pasta. Brown rice. [place]. Bulgur. Whole-grain and low-sodium cereals. Pita bread. Low-fat, low-sodium crackers. Whole-wheat flour tortillas. Meats and other proteins Skinless chicken or [place]. Ground chicken or [place]. Pork with fat trimmed off. Fish and seafood. Egg whites. Dried beans, peas, or lentils. Unsalted nuts, nut butters, and seeds. Unsalted canned beans. Lean cuts of beef with fat trimmed off. Low-sodium, lean precooked or cured meat, such as sausages or meat loaves. Dairy Low-fat (1%) or fat-free (skim) milk. Reduced-fat, low-fat, or fat-free cheeses. Nonfat, low-sodium ricotta or cottage cheese. Low-fat or nonfat yogurt. Low-fat, low-sodium cheese. Fats and oils Soft margarine without trans fats. Vegetable oil. Reduced-fat, low-fat, or light mayonnaise and salad dressings (reduced-sodium). Canola, safflower, olive, avocado, soybean, and sunflower oils. Avocado. Seasonings and condiments Herbs. Spices. Seasoning mixes without salt. Other foods Unsalted popcorn and pretzels. Fat-free sweets. The items listed above may not be a complete list of foods and beverages you can eat. Contact a dietitian for more information. What foods should I avoid? Fruits Canned fruit in a light or heavy syrup. Fried fruit. Fruit in cream or butter sauce. Vegetables Creamed or fried vegetables. Vegetables in a cheese sauce. Regular canned vegetables (not low-sodium or reduced-sodium). Regular canned tomato sauce and paste (not low-sodium or reduced-sodium). Regular tomato and vegetable juice (not low-sodium or reduced-sodium). Angie Fava. Olives. Grains Baked goods made with fat, such  as croissants, muffins, or some breads. Dry pasta or rice meal packs. Meats and other proteins Fatty cuts of meat. Ribs. Fried meat. Berniece Salines. Bologna, salami, and other precooked or cured meats, such as sausages or meat loaves. Fat from the back of a pig (fatback). Bratwurst. Salted nuts and seeds. Canned beans with added salt. Canned or smoked fish. Whole eggs or egg yolks. Chicken or [place] with skin. Dairy Whole or 2% milk, cream, and half-and-half. Whole or full-fat cream cheese. Whole-fat or sweetened yogurt. Full-fat cheese. Nondairy creamers. Whipped toppings. Processed cheese and cheese spreads. Fats and oils Butter. Stick margarine. Lard. Shortening. Ghee. Bacon fat. Tropical oils, such as coconut, palm kernel, or palm oil. Seasonings and condiments Onion salt, garlic salt, seasoned salt, table salt, and sea salt. Worcestershire sauce. Tartar sauce. Barbecue sauce. Teriyaki sauce. Soy sauce, including reduced-sodium. Steak sauce. Canned and packaged gravies. Fish sauce. Oyster sauce. Cocktail sauce. Store-bought horseradish. Ketchup. Mustard. Meat flavorings and tenderizers. Bouillon cubes. Hot sauces. Pre-made  or packaged marinades. Pre-made or packaged taco seasonings. Relishes. Regular salad dressings. Other foods Salted popcorn and pretzels. The items listed above may not be a complete list of foods and beverages you should avoid. Contact a dietitian for more information. Where to find more information National Heart, Lung, and Blood Institute: https://wilson-eaton.com/ American Heart Association: www.heart.org Academy of Nutrition and Dietetics: www.eatright.[place]: www.kidney.org Summary The DASH eating plan is a healthy eating plan that has been shown to reduce high blood pressure (hypertension). It may also reduce your risk for type 2 diabetes, heart disease, and stroke. When on the DASH eating plan, aim to eat more fresh fruits and vegetables, whole grains, lean  proteins, low-fat dairy, and heart-healthy fats. With the DASH eating plan, you should limit salt (sodium) intake to 2,300 mg a day. If you have hypertension, you may need to reduce your sodium intake to 1,500 mg a day. Work with your health care provider or dietitian to adjust your eating plan to your individual calorie needs. This information is not intended to replace advice given to you by your health care provider. Make sure you discuss any questions you have with your health care provider. Document Revised: 08/24/2019 Document Reviewed: 08/24/2019 Elsevier Patient Education  [place].

## 2022-04-22 NOTE — Progress Notes (Signed)
Subjective:  Patient ID: Amanda Barrett, female    DOB: 03/15/1976  Age: 46 y.o. MRN: [id number]  Chief Complaint  Patient presents with   Hypertension    HPI:   Pt presents for follow-up of hypertension, Vit D deficiency, and depression. She has a recent history urticaria. States symptoms have been well-controlled the past few weeks. Reports feeling overly sedated with Zyrtec, Ambien, and Clonidine in the evening.     Hypertension, follow-up: She was last seen for hypertension 3 months ago.  BP at that visit was 138/96. Management includes coreg, clonidine     She reports excellent compliance with treatment. She is not having side effects.  She is following a Regular diet. She is not exercising. She does smoke. Use of agents associated with hypertension: none.   BP log: 111/72, 153/94, 144/87  Pertinent labs: Lab Results  Component Value Date   CHOL 189 01/13/2022   HDL 56 01/13/2022   LDLCALC 120 (H) 01/13/2022   TRIG 73 01/13/2022   CHOLHDL 3.4 01/13/2022   Lab Results  Component Value Date   NA 143 02/25/2022   K 3.4 (L) 02/25/2022   CREATININE 0.99 02/25/2022   EGFR 73 02/25/2022   GLUCOSE 93 02/25/2022        Vitamin D deficiency, follow-up  Lab Results  Component Value Date   VD25OH 17.2 (L) 09/22/2021   CALCIUM 9.4 02/25/2022   CALCIUM 9.1 01/13/2022   PTH 33 11/06/2018   Wt Readings from Last 3 Encounters:  04/05/22 267 lb (121.1 kg)  03/26/22 267 lb (121.1 kg)  03/10/22 250 lb (113.4 kg)    Management since that visit includes She reports excellent compliance with treatment. She is not having side effects.    Depression, Follow-up  She  was last seen for this 3 months ago. Current treatment includes Wellbutrin 150 mg BID. She reports good compliance with treatment. She is not having side effects.  She reports excellent tolerance of treatment. Current symptoms include: fatigue She feels she is Unchanged since last visit.      09/15/2021    2:07 PM 09/15/2021    2:06 PM 05/20/2021    8:13 AM  Depression screen PHQ 2/9  Decreased Interest 0 0 0  Down, Depressed, Hopeless 0 0 0  PHQ - 2 Score 0 0 0  Altered sleeping 3    Tired, decreased energy 0    Change in appetite 0    Feeling bad or failure about yourself  0    Trouble concentrating 0    Moving slowly or fidgety/restless 0    Suicidal thoughts 0    PHQ-9 Score 3    Difficult doing work/chores Not difficult at all       Current Outpatient Medications on File Prior to Visit  Medication Sig Dispense Refill   albuterol (PROAIR HFA) 108 (90 Base) MCG/ACT inhaler Inhale 2 puffs into the lungs every 4 (four) hours as needed for wheezing or shortness of breath. 1 each 2   buPROPion (WELLBUTRIN SR) 150 MG 12 hr tablet TAKE 1 TABLET BY MOUTH TWICE A DAY 180 tablet 3   carvedilol (COREG) 25 MG tablet TAKE 1 TABLET BY MOUTH 2 TIMES DAILY WITH A MEAL. 90 tablet 2   cetirizine (ZYRTEC) 10 MG tablet Take 1 tablet (10 mg total) by mouth daily. 180 tablet 2   cloNIDine (CATAPRES) 0.2 MG tablet Take 1 tablet (0.2 mg total) by mouth 2 (two) times daily. 60 tablet 0  EPINEPHrine 0.3 mg/0.3 mL IJ SOAJ injection Inject 0.3 mg into the muscle as needed for anaphylaxis. 1 each 1   famotidine (PEPCID) 20 MG tablet Take 1 tablet (20 mg total) by mouth 2 (two) times daily. 90 tablet 1   fluticasone (FLONASE) 50 MCG/ACT nasal spray Place 2 sprays into both nostrils daily. 16 g 0   KLOR-CON M10 10 MEQ tablet TAKE 1 TABLET BY MOUTH 2 TIMES DAILY. 60 tablet 3   triamcinolone cream (KENALOG) 0.1 % Apply 1 application. topically 2 (two) times daily. 30 g 0   Vitamin D, Ergocalciferol, (DRISDOL) 1.25 MG (50000 UNIT) CAPS capsule TAKE 1 CAPSULE (50,000 UNITS TOTAL) BY MOUTH EVERY 7 (SEVEN) DAYS 4 capsule 2   zolpidem (AMBIEN) 10 MG tablet Take one tablet by mouth at night as needed for insomnia 30 tablet 2   No current facility-administered medications on file prior to visit.   Past  Medical History:  Diagnosis Date   Anemia    Pernicious   Angio-edema    Hypertension    Recurrent upper respiratory infection (URI)    Urticaria    Past Surgical History:  Procedure Laterality Date   ADENOIDECTOMY     MYOMECTOMY     Uterine Fibroids   SINOSCOPY     SINUS EXPLORATION     TONSILLECTOMY      Family History  Problem Relation Age of Onset   Stroke Mother    Hypertension Mother    Diabetes Mother        type 1   Heart disease Mother    Hypertension Father    Diabetes Father        Type 2   Cancer Maternal Grandmother        Ovarian   Heart disease Maternal Grandmother    Hypertension Maternal Grandmother    Diabetes Maternal Grandmother        Type 1   Kidney disease Maternal Grandmother    Diabetes Maternal Grandfather        Type 2   Other Maternal Grandfather        ITP   Hypotension Maternal Grandfather    Diabetes Paternal Grandmother        Type 2   Hypertension Paternal Grandmother    Cancer Paternal Grandfather        Bladder   Hypertension Paternal Grandfather    Social History   Socioeconomic History   Marital status: Married    Spouse name: Gus Designer, industrial/product   Number of children: Not on file   Years of education: Not on file   Highest education level: Not on file  Occupational History   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  Tobacco Use   Smoking status: Every Day    Packs/day: 0.25    Years: 20.00    Total pack years: 5.00    Types: Cigarettes    Passive exposure: Current   Smokeless tobacco: Never  Vaping Use   Vaping Use: Never used  Substance and Sexual Activity   Alcohol use: No   Drug use: No   Sexual activity: Yes    Partners: Male    Birth control/protection: None  Other Topics Concern   Not on file  Social History Narrative   Not on file   Social Determinants of Health   Financial Resource Strain: Low Risk  (09/15/2021)   Overall Financial Resource Strain (CARDIA)    Difficulty of  Paying Living Expenses: Not hard at  all  Food Insecurity: No Food Insecurity (09/15/2021)   Hunger Vital Sign    Worried About Running Out of Food in the Last Year: Never true    Ran Out of Food in the Last Year: Never true  Transportation Needs: No Transportation Needs (09/15/2021)   PRAPARE - Hydrologist (Medical): No    Lack of Transportation (Non-Medical): No  Physical Activity: Insufficiently Active (09/15/2021)   Exercise Vital Sign    Days of Exercise per Week: 2 days    Minutes of Exercise per Session: 20 min  Stress: No Stress Concern Present (09/15/2021)   [place]    Feeling of Stress : Not at all  Social Connections: Moderately Isolated (09/15/2021)   Social Connection and Isolation Panel [NHANES]    Frequency of Communication with Friends and Family: More than three times a week    Frequency of Social Gatherings with Friends and Family: Three times a week    Attends Religious Services: Never    Active Member of Clubs or Organizations: No    Attends Archivist Meetings: Never    Marital Status: Married    Review of Systems  Constitutional:  Positive for fatigue. Negative for chills and fever.  HENT:  Negative for congestion, ear pain, rhinorrhea and sore throat.   Respiratory:  Negative for cough and shortness of breath.   Cardiovascular:  Negative for chest pain.  Gastrointestinal:  Negative for abdominal pain, constipation, diarrhea, nausea and vomiting.  Genitourinary:  Negative for dysuria and urgency.  Musculoskeletal:  Negative for back pain and myalgias.  Allergic/Immunologic: Positive for environmental allergies.  Neurological:  Negative for dizziness, weakness, light-headedness and headaches.  Psychiatric/Behavioral:  Positive for sleep disturbance (insomnia). Negative for dysphoric mood. The patient is not nervous/anxious.      Objective:  BP (!)  168/110 (BP Location: Left Arm, Patient Position: Sitting)   Pulse (!) 54   Temp (!) 97 F (36.1 C) (Temporal)   Wt 269 lb (122 kg)   SpO2 98%   BMI 42.77 kg/m    BP (!) 150/88   Pulse (!) 54   Temp (!) 97 F (36.1 C) (Temporal)   Wt 269 lb (122 kg)   SpO2 98%   BMI 42.77 kg/m       04/05/2022    1:38 PM 04/05/2022    1:28 PM 03/26/2022   10:59 AM  BP/Weight  Systolic BP 403 474 259  Diastolic BP 563 875 70  Wt. (Lbs)  267 267  BMI  42.45 kg/m2 42.4 kg/m2    Physical Exam Vitals reviewed.  Constitutional:      Appearance: She is obese.  HENT:     Right Ear: Tympanic membrane normal.     Left Ear: Tympanic membrane normal.  Neck:     Vascular: No carotid bruit.  Cardiovascular:     Rate and Rhythm: Regular rhythm. Bradycardia present.     Heart sounds: Normal heart sounds.  Pulmonary:     Effort: Pulmonary effort is normal. No respiratory distress.     Breath sounds: Normal breath sounds.  Abdominal:     General: Bowel sounds are normal.     Palpations: Abdomen is soft.     Tenderness: There is no abdominal tenderness.  Skin:    General: Skin is warm and dry.     Capillary Refill: Capillary refill takes less than 2 seconds.  Neurological:  General: No focal deficit present.     Mental Status: She is alert and oriented to person, place, and time.  Psychiatric:        Mood and Affect: Mood normal.        Behavior: Behavior normal.      Lab Results  Component Value Date   WBC 5.9 01/13/2022   HGB 13.2 01/13/2022   HCT 40.4 01/13/2022   PLT 295 01/13/2022   GLUCOSE 93 02/25/2022   CHOL 189 01/13/2022   TRIG 73 01/13/2022   HDL 56 01/13/2022   LDLCALC 120 (H) 01/13/2022   ALT 46 (H) 02/25/2022   AST 29 02/25/2022   NA 143 02/25/2022   K 3.4 (L) 02/25/2022   CL 105 02/25/2022   CREATININE 0.99 02/25/2022   BUN 11 02/25/2022   CO2 22 02/25/2022   TSH 2.330 02/25/2022   HGBA1C 5.6 01/13/2022      Assessment & Plan:    1. Essential  hypertension-not at goal - CBC with Differential/Platelet - Comprehensive metabolic panel - Lipid panel - VITAMIN D 25 Hydroxy (Vit-D Deficiency, Fractures) -continue Clonidine and Coreg -add HCTZ 25 mg QD -DASH diet  2. Vitamin D deficiency-not at goal - VITAMIN D 25 Hydroxy (Vit-D Deficiency, Fractures) -continue Vit D 50, 000 U weekly -continue Vit D rich diet  3. Non-seasonal allergic rhinitis due to pollen - levocetirizine (XYZAL) 5 MG tablet; Take 1 tablet (5 mg total) by mouth every evening.  Dispense: 90 tablet; Refill: 1 -stop Zyrtec 10 mg   4. Moderate episode of recurrent major depressive disorder (HCC)-not at goal - buPROPion (WELLBUTRIN XL) 300 MG 24 hr tablet; Take 1 tablet (300 mg total) by mouth daily.  Dispense: 90 tablet; Refill: 1 - buPROPion (WELLBUTRIN SR) 150 MG 12 hr tablet; Take 1 tablet (150 mg total) by mouth daily.  Dispense: 90 tablet; Refill: 1  5. Cigarette nicotine dependence with nicotine-induced disorder -recommend smoking cessation    Continue medications Increase Wellbutrin to 450 mg total (300 mg in the am and 150 mg in the pm) Decrease Zyrtec to 10 mg daily Begin Hydrochlorothiazide 25 mg daily Monitor BP, keep log Return in 4 weeks, bring BP log Notify office of any adverse side effects We will call you with lab results     Follow-up: 4-weeks  An After Visit Summary was printed and given to the patient.  I, Rip Harbour, NP, have reviewed all documentation for this visit. The documentation on 04/26/22 for the exam, diagnosis, procedures, and orders are all accurate and complete.   Signed, Rip Harbour, NP [place] [phone]

## 2022-04-23 ENCOUNTER — Other Ambulatory Visit: Payer: Self-pay | Admitting: Nurse Practitioner

## 2022-04-23 DIAGNOSIS — R21 Rash and other nonspecific skin eruption: Secondary | ICD-10-CM

## 2022-04-26 ENCOUNTER — Other Ambulatory Visit: Payer: Self-pay | Admitting: Nurse Practitioner

## 2022-04-26 ENCOUNTER — Ambulatory Visit: Payer: BC Managed Care – PPO | Admitting: Nurse Practitioner

## 2022-04-26 ENCOUNTER — Encounter: Payer: Self-pay | Admitting: Nurse Practitioner

## 2022-04-26 VITALS — BP 150/88 | HR 54 | Temp 97.0°F | Wt 269.0 lb

## 2022-04-26 DIAGNOSIS — J301 Allergic rhinitis due to pollen: Secondary | ICD-10-CM | POA: Diagnosis not present

## 2022-04-26 DIAGNOSIS — F331 Major depressive disorder, recurrent, moderate: Secondary | ICD-10-CM

## 2022-04-26 DIAGNOSIS — E559 Vitamin D deficiency, unspecified: Secondary | ICD-10-CM

## 2022-04-26 DIAGNOSIS — I10 Essential (primary) hypertension: Secondary | ICD-10-CM

## 2022-04-26 DIAGNOSIS — F5101 Primary insomnia: Secondary | ICD-10-CM

## 2022-04-26 DIAGNOSIS — F17219 Nicotine dependence, cigarettes, with unspecified nicotine-induced disorders: Secondary | ICD-10-CM

## 2022-04-26 MED ORDER — LEVOCETIRIZINE DIHYDROCHLORIDE 5 MG PO TABS: 5.0000 mg | ORAL_TABLET | Freq: Every evening | ORAL | 1 refills | Status: DC

## 2022-04-26 MED ORDER — BUPROPION HCL ER (XL) 300 MG PO TB24: 300.0000 mg | ORAL_TABLET | Freq: Every day | ORAL | 1 refills | Status: DC

## 2022-04-26 MED ORDER — BUPROPION HCL ER (SR) 150 MG PO TB12: 150.0000 mg | ORAL_TABLET | Freq: Every day | ORAL | 1 refills | Status: DC

## 2022-04-26 MED ORDER — HYDROCHLOROTHIAZIDE 25 MG PO TABS: 25.0000 mg | ORAL_TABLET | Freq: Every day | ORAL | 3 refills | Status: DC

## 2022-04-26 NOTE — Patient Instructions (Addendum)
Continue medications Increase Wellbutrin to 450 mg total (300 mg in the am and 150 mg in the pm) Decrease Zyrtec to 10 mg daily Begin Hydrochlorothiazide 25 mg daily Monitor BP, keep log Return in 4 weeks, bring BP log Notify office of any adverse side effects We will call you with lab results      Managing Your Hypertension Hypertension, also called high blood pressure, is when the force of the blood pressing against the walls of the arteries is too strong. Arteries are blood vessels that carry blood from your heart throughout your body. Hypertension forces the heart to work harder to pump blood and may cause the arteries to become narrow or stiff. Understanding blood pressure readings A blood pressure reading includes a higher number over a lower number: The first, or top, number is called the systolic pressure. It is a measure of the pressure in your arteries as your heart beats. The second, or bottom number, is called the diastolic pressure. It is a measure of the pressure in your arteries as the heart relaxes. For most people, a normal blood pressure is below 120/80. Your personal target blood pressure may vary depending on your medical conditions, your age, and other factors. Blood pressure is classified into four stages. Based on your blood pressure reading, your health care provider may use the following stages to determine what type of treatment you need, if any. Systolic pressure and diastolic pressure are measured in a unit called millimeters of mercury (mmHg). Normal Systolic pressure: below 654. Diastolic pressure: below 80. Elevated Systolic pressure: 650-354. Diastolic pressure: below 80. Hypertension stage 1 Systolic pressure: 656-812. Diastolic pressure: 75-17. Hypertension stage 2 Systolic pressure: 001 or above. Diastolic pressure: 90 or above. How can this condition affect me? Managing your hypertension is very important. Over time, hypertension can damage the  arteries and decrease blood flow to parts of the body, including the brain, heart, and kidneys. Having untreated or uncontrolled hypertension can lead to: A heart attack. A stroke. A weakened blood vessel (aneurysm). Heart failure. Kidney damage. Eye damage. Memory and concentration problems. Vascular dementia. What actions can I take to manage this condition? Hypertension can be managed by making lifestyle changes and possibly by taking medicines. Your health care provider will help you make a plan to bring your blood pressure within a normal range. You may be referred for counseling on a healthy diet and physical activity. Nutrition  Eat a diet that is high in fiber and potassium, and low in salt (sodium), added sugar, and fat. An example eating plan is called the DASH diet. DASH stands for Dietary Approaches to Stop Hypertension. To eat this way: Eat plenty of fresh fruits and vegetables. Try to fill one-half of your plate at each meal with fruits and vegetables. Eat whole grains, such as whole-wheat pasta, brown rice, or whole-grain bread. Fill about one-fourth of your plate with whole grains. Eat low-fat dairy products. Avoid fatty cuts of meat, processed or cured meats, and poultry with skin. Fill about one-fourth of your plate with lean proteins such as fish, chicken without skin, beans, eggs, and tofu. Avoid pre-made and processed foods. These tend to be higher in sodium, added sugar, and fat. Reduce your daily sodium intake. Many people with hypertension should eat less than 1,500 mg of sodium a day. Lifestyle  Work with your health care provider to maintain a healthy body weight or to lose weight. Ask what an ideal weight is for you. Get at least 30  minutes of exercise that causes your heart to beat faster (aerobic exercise) most days of the week. Activities may include walking, swimming, or biking. Include exercise to strengthen your muscles (resistance exercise), such as weight  lifting, as part of your weekly exercise routine. Try to do these types of exercises for 30 minutes at least 3 days a week. Do not use any products that contain nicotine or tobacco. These products include cigarettes, chewing tobacco, and vaping devices, such as e-cigarettes. If you need help quitting, ask your health care provider. Control any long-term (chronic) conditions you have, such as high cholesterol or diabetes. Identify your sources of stress and find ways to manage stress. This may include meditation, deep breathing, or making time for fun activities. Alcohol use Do not drink alcohol if: Your health care provider tells you not to drink. You are pregnant, may be pregnant, or are planning to become pregnant. If you drink alcohol: Limit how much you have to: 0-1 drink a day for women. 0-2 drinks a day for men. Know how much alcohol is in your drink. In the [place] one drink equals one 12 oz bottle of beer (355 mL), one 5 oz glass of wine (148 mL), or one 1 oz glass of hard liquor (44 mL). Medicines Your health care provider may prescribe medicine if lifestyle changes are not enough to get your blood pressure under control and if: Your systolic blood pressure is 130 or higher. Your diastolic blood pressure is 80 or higher. Take medicines only as told by your health care provider. Follow the directions carefully. Blood pressure medicines must be taken as told by your health care provider. The medicine does not work as well when you skip doses. Skipping doses also puts you at risk for problems. Monitoring Before you monitor your blood pressure: Do not smoke, drink caffeinated beverages, or exercise within 30 minutes before taking a measurement. Use the bathroom and empty your bladder (urinate). Sit quietly for at least 5 minutes before taking measurements. Monitor your blood pressure at home as told by your health care provider. To do this: Sit with your back straight and  supported. Place your feet flat on the floor. Do not cross your legs. Support your arm on a flat surface, such as a table. Make sure your upper arm is at heart level. Each time you measure, take two or three readings one minute apart and record the results. You may also need to have your blood pressure checked regularly by your health care provider. General information Talk with your health care provider about your diet, exercise habits, and other lifestyle factors that may be contributing to hypertension. Review all the medicines you take with your health care provider because there may be side effects or interactions. Keep all follow-up visits. Your health care provider can help you create and adjust your plan for managing your high blood pressure. Where to find more information National Heart, Lung, and Blood Institute: https://wilson-eaton.com/ American Heart Association: www.heart.org Contact a health care provider if: You think you are having a reaction to medicines you have taken. You have repeated (recurrent) headaches. You feel dizzy. You have swelling in your ankles. You have trouble with your vision. Get help right away if: You develop a severe headache or confusion. You have unusual weakness or numbness, or you feel faint. You have severe pain in your chest or abdomen. You vomit repeatedly. You have trouble breathing. These symptoms may be an emergency. Get help right away. Call 911.  Do not wait to see if the symptoms will go away. Do not drive yourself to the hospital. Summary Hypertension is when the force of blood pumping through your arteries is too strong. If this condition is not controlled, it may put you at risk for serious complications. Your personal target blood pressure may vary depending on your medical conditions, your age, and other factors. For most people, a normal blood pressure is less than 120/80. Hypertension is managed by lifestyle changes, medicines, or  both. Lifestyle changes to help manage hypertension include losing weight, eating a healthy, low-sodium diet, exercising more, stopping smoking, and limiting alcohol. This information is not intended to replace advice given to you by your health care provider. Make sure you discuss any questions you have with your health care provider. Document Revised: 06/04/2021 Document Reviewed: 06/04/2021 Elsevier Patient Education  [place].   Pruritus Pruritus is an itchy feeling on the skin. One of the most common causes is dry skin, but many different things can cause itching. Most cases of itching do not require medical attention. Sometimes itchy skin can turn into a rash. Follow these instructions at home: Skin care  Apply moisturizing lotion to your skin as needed. Lotion that contains petroleum jelly is best. Take medicines or apply medicated creams only as told by your health care provider. This may include: Corticosteroid cream. Anti-itch lotions. Oral antihistamines. Apply a cool, wet cloth (cool compress) to the affected areas. Take baths with one of the following: Epsom salts. You can get these at your local pharmacy or grocery store. Follow the instructions on the packaging. Baking soda. Pour a small amount into the bath as told by your health care provider. Colloidal oatmeal. You can get this at your local pharmacy or grocery store. Follow the instructions on the packaging. Apply baking soda paste to your skin. To make the paste, stir water into a small amount of baking soda until it reaches a paste-like consistency. Do not scratch your skin. Do not take hot showers or baths, which can make itching worse. A cool shower may help with itching as long as you apply moisturizing lotion after the shower. Do not use scented soaps, detergents, perfumes, and cosmetic products. Instead, use gentle, unscented versions of these items. General instructions Avoid wearing tight  clothes. Keep a journal to help find out what is causing your itching. Write down: What you eat and drink. What cosmetic products you use. What soaps or detergents you use. What you wear, including jewelry. Use a humidifier. This keeps the air moist, which helps to prevent dry skin. Be aware of any changes in your itchiness. Contact a health care provider if: The itching does not go away after several days. You are unusually thirsty or urinating more than normal. Your skin tingles or feels numb. Your skin or the white parts of your eyes turn yellow (jaundice). You feel weak. You have any of the following: Night sweats. Tiredness (fatigue). Weight loss. Abdominal pain. Summary Pruritus is an itchy feeling on the skin. One of the most common causes is dry skin, but many different conditions and factors can cause itching. Apply moisturizing lotion to your skin as needed. Lotion that contains petroleum jelly is best. Take medicines or apply medicated creams only as told by your health care provider. Do not take hot showers or baths. Do not use scented soaps, detergents, perfumes, or cosmetic products. This information is not intended to replace advice given to you by your health care  provider. Make sure you discuss any questions you have with your health care provider. Document Revised: 07/06/2021 Document Reviewed: 07/06/2021 Elsevier Patient Education  [employer].

## 2022-04-27 ENCOUNTER — Ambulatory Visit: Payer: Commercial Managed Care - PPO | Admitting: Allergy

## 2022-04-27 LAB — COMPREHENSIVE METABOLIC PANEL
ALT: 39 IU/L — ABNORMAL HIGH (ref 0–32)
AST: 27 IU/L (ref 0–40)
Albumin/Globulin Ratio: 1.7 (ref 1.2–2.2)
Albumin: 4.2 g/dL (ref 3.9–4.9)
Alkaline Phosphatase: 78 IU/L (ref 44–121)
BUN/Creatinine Ratio: 10 (ref 9–23)
BUN: 11 mg/dL (ref 6–24)
Bilirubin Total: 0.3 mg/dL (ref 0.0–1.2)
CO2: 23 mmol/L (ref 20–29)
Calcium: 9.5 mg/dL (ref 8.7–10.2)
Chloride: 101 mmol/L (ref 96–106)
Creatinine, Ser: 1.08 mg/dL — ABNORMAL HIGH (ref 0.57–1.00)
Globulin, Total: 2.5 g/dL (ref 1.5–4.5)
Glucose: 86 mg/dL (ref 70–99)
Potassium: 3.6 mmol/L (ref 3.5–5.2)
Sodium: 141 mmol/L (ref 134–144)
Total Protein: 6.7 g/dL (ref 6.0–8.5)
eGFR: 65 mL/min/{1.73_m2} (ref >59)

## 2022-04-27 LAB — CBC WITH DIFFERENTIAL/PLATELET
Basophils Absolute: 0 10*3/uL (ref 0.0–0.2)
Basos: 0 %
EOS (ABSOLUTE): 0.2 10*3/uL (ref 0.0–0.4)
Eos: 4 %
Hematocrit: 41 % (ref 34.0–46.6)
Hemoglobin: 12.9 g/dL (ref 11.1–15.9)
Immature Grans (Abs): 0 10*3/uL (ref 0.0–0.1)
Immature Granulocytes: 0 %
Lymphocytes Absolute: 1.9 10*3/uL (ref 0.7–3.1)
Lymphs: 33 %
MCH: 24.5 pg — ABNORMAL LOW (ref 26.6–33.0)
MCHC: 31.5 g/dL (ref 31.5–35.7)
MCV: 78 fL — ABNORMAL LOW (ref 79–97)
Monocytes Absolute: 0.6 10*3/uL (ref 0.1–0.9)
Monocytes: 10 %
Neutrophils Absolute: 3 10*3/uL (ref 1.4–7.0)
Neutrophils: 53 %
Platelets: 288 10*3/uL (ref 150–450)
RBC: 5.27 x10E6/uL (ref 3.77–5.28)
RDW: 16.3 % — ABNORMAL HIGH (ref 11.7–15.4)
WBC: 5.7 10*3/uL (ref 3.4–10.8)

## 2022-04-27 LAB — CARDIOVASCULAR RISK ASSESSMENT

## 2022-04-27 LAB — LIPID PANEL
Chol/HDL Ratio: 2.9 ratio (ref 0.0–4.4)
Cholesterol, Total: 221 mg/dL — ABNORMAL HIGH (ref 100–199)
HDL: 76 mg/dL (ref >39)
LDL Chol Calc (NIH): 132 mg/dL — ABNORMAL HIGH (ref 0–99)
Triglycerides: 75 mg/dL (ref 0–149)
VLDL Cholesterol Cal: 13 mg/dL (ref 5–40)

## 2022-04-27 LAB — VITAMIN D 25 HYDROXY (VIT D DEFICIENCY, FRACTURES): Vit D, 25-Hydroxy: 41.8 ng/mL (ref 30.0–100.0)

## 2022-05-03 ENCOUNTER — Other Ambulatory Visit: Payer: Self-pay | Admitting: Nurse Practitioner

## 2022-05-03 ENCOUNTER — Encounter: Payer: Self-pay | Admitting: Nurse Practitioner

## 2022-05-03 DIAGNOSIS — I10 Essential (primary) hypertension: Secondary | ICD-10-CM

## 2022-05-04 ENCOUNTER — Other Ambulatory Visit: Payer: Self-pay | Admitting: Nurse Practitioner

## 2022-05-04 DIAGNOSIS — I10 Essential (primary) hypertension: Secondary | ICD-10-CM

## 2022-05-04 MED ORDER — HYDRALAZINE HCL 25 MG PO TABS: 25.0000 mg | ORAL_TABLET | Freq: Four times a day (QID) | ORAL | 0 refills | Status: DC

## 2022-05-04 MED ORDER — HYDRALAZINE HCL 10 MG PO TABS: 10.0000 mg | ORAL_TABLET | Freq: Four times a day (QID) | ORAL | 0 refills | Status: DC

## 2022-05-06 ENCOUNTER — Other Ambulatory Visit: Payer: Self-pay | Admitting: Nurse Practitioner

## 2022-05-06 DIAGNOSIS — I10 Essential (primary) hypertension: Secondary | ICD-10-CM

## 2022-05-08 ENCOUNTER — Other Ambulatory Visit: Payer: Self-pay | Admitting: Nurse Practitioner

## 2022-05-08 DIAGNOSIS — I10 Essential (primary) hypertension: Secondary | ICD-10-CM

## 2022-05-08 DIAGNOSIS — E876 Hypokalemia: Secondary | ICD-10-CM

## 2022-05-24 NOTE — Progress Notes (Unsigned)
Subjective:  Patient ID: Amanda Barrett, female    DOB: 20-Sep-1976  Age: 46 y.o. MRN: [id number]  No chief complaint on file.   HPI   Pt presents for follow-up of hypertension, Vit D deficiency, and depression. She has a recent history urticaria. States symptoms have been well-controlled the past few weeks. Reports feeling overly sedated with Zyrtec, Ambien, and Clonidine in the evening.     Hypertension, follow-up: She was last seen for hypertension 3 months ago.  BP at that visit was 138/96. Management includes coreg, clonidine     She reports excellent compliance with treatment. She is not having side effects.  She is following a Regular diet. She is not exercising. She does smoke. Use of agents associated with hypertension: none.    BP log: 111/72, 153/94, 144/87  Continue medications Increase Wellbutrin to 450 mg total (300 mg in the am and 150 mg in the pm) Decrease Zyrtec to 10 mg daily Begin Hydrochlorothiazide 25 mg daily Monitor BP, keep log Return in 4 weeks, bring BP log Notify office of any adverse side effects We will call you with lab results    She was last seen for hypertension 4 weeks ago.  BP at that visit was 150/88. Management includes increased HCTZ 25 mg, continue coreg and clonidine.  She reports {excellent/good/fair/poor:19665} compliance with treatment. She {is/is not:9024} having side effects. {document side effects if present:1} She is following a {diet:21022986} diet. She {is/is not:9024} exercising. She {does/does not:200015} smoke.  Use of agents associated with hypertension: {bp agents assoc with hypertension:511::"none"}.   Outside blood pressures are {***enter patient reported home BP readings, or 'not being checked':1}.  Pertinent labs: Lab Results  Component Value Date   CHOL 221 (H) 04/26/2022   HDL 76 04/26/2022   LDLCALC 132 (H) 04/26/2022   TRIG 75 04/26/2022   CHOLHDL 2.9 04/26/2022   Lab Results  Component Value Date   NA  141 04/26/2022   K 3.6 04/26/2022   CREATININE 1.08 (H) 04/26/2022   EGFR 65 04/26/2022   GLUCOSE 86 04/26/2022     The 10-year ASCVD risk score (Arnett DK, et al., 2019) is: 3.4%   Current Outpatient Medications on File Prior to Visit  Medication Sig Dispense Refill   albuterol (PROAIR HFA) 108 (90 Base) MCG/ACT inhaler Inhale 2 puffs into the lungs every 4 (four) hours as needed for wheezing or shortness of breath. 1 each 2   buPROPion (WELLBUTRIN SR) 150 MG 12 hr tablet Take 1 tablet (150 mg total) by mouth daily. 90 tablet 1   buPROPion (WELLBUTRIN XL) 300 MG 24 hr tablet Take 1 tablet (300 mg total) by mouth daily. 90 tablet 1   carvedilol (COREG) 25 MG tablet TAKE 1 TABLET BY MOUTH TWICE A DAY WITH MEALS 60 tablet 4   cloNIDine (CATAPRES) 0.2 MG tablet TAKE 1 TABLET BY MOUTH 2 TIMES DAILY. (NEED NEW INS) 60 tablet 0   EPINEPHrine 0.3 mg/0.3 mL IJ SOAJ injection Inject 0.3 mg into the muscle as needed for anaphylaxis. 1 each 1   famotidine (PEPCID) 20 MG tablet TAKE 1 TABLET BY MOUTH TWICE A DAY (NOT COVERED) 60 tablet 2   fluticasone (FLONASE) 50 MCG/ACT nasal spray Place 2 sprays into both nostrils daily. 16 g 0   hydrALAZINE (APRESOLINE) 10 MG tablet Take 1 tablet (10 mg total) by mouth 4 (four) times daily for 3 days. Then increase to 25 mg by mouth 4 (four) times daily 12 tablet 0  hydrALAZINE (APRESOLINE) 25 MG tablet Take 1 tablet (25 mg total) by mouth 4 (four) times daily. 120 tablet 0   hydrochlorothiazide (HYDRODIURIL) 25 MG tablet Take 1 tablet (25 mg total) by mouth daily. 90 tablet 3   KLOR-CON M10 10 MEQ tablet TAKE 1 TABLET BY MOUTH TWICE A DAY 60 tablet 3   levocetirizine (XYZAL) 5 MG tablet Take 1 tablet (5 mg total) by mouth every evening. 90 tablet 1   triamcinolone cream (KENALOG) 0.1 % Apply 1 application. topically 2 (two) times daily. 30 g 0   Vitamin D, Ergocalciferol, (DRISDOL) 1.25 MG (50000 UNIT) CAPS capsule TAKE 1 CAPSULE (50,000 UNITS TOTAL) BY MOUTH  EVERY 7 (SEVEN) DAYS 4 capsule 2   zolpidem (AMBIEN) 10 MG tablet Take one tablet by mouth at night as needed for insomnia 30 tablet 2   No current facility-administered medications on file prior to visit.   Past Medical History:  Diagnosis Date   Anemia    Pernicious   Angio-edema    Hypertension    Recurrent upper respiratory infection (URI)    Urticaria    Past Surgical History:  Procedure Laterality Date   ADENOIDECTOMY     MYOMECTOMY     Uterine Fibroids   SINOSCOPY     SINUS EXPLORATION     TONSILLECTOMY      Family History  Problem Relation Age of Onset   Stroke Mother    Hypertension Mother    Diabetes Mother        type 1   Heart disease Mother    Hypertension Father    Diabetes Father        Type 2   Cancer Maternal Grandmother        Ovarian   Heart disease Maternal Grandmother    Hypertension Maternal Grandmother    Diabetes Maternal Grandmother        Type 1   Kidney disease Maternal Grandmother    Diabetes Maternal Grandfather        Type 2   Other Maternal Grandfather        ITP   Hypotension Maternal Grandfather    Diabetes Paternal Grandmother        Type 2   Hypertension Paternal Grandmother    Cancer Paternal Grandfather        Bladder   Hypertension Paternal Grandfather    Social History   Socioeconomic History   Marital status: Married    Spouse name: Gus Designer, industrial/product   Number of children: Not on file   Years of education: Not on file   Highest education level: Not on file  Occupational History   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  Tobacco Use   Smoking status: Every Day    Packs/day: 0.25    Years: 20.00    Total pack years: 5.00    Types: Cigarettes    Passive exposure: Current   Smokeless tobacco: Never  Vaping Use   Vaping Use: Never used  Substance and Sexual Activity   Alcohol use: No   Drug use: No   Sexual activity: Yes    Partners: Male    Birth control/protection: None  Other Topics  Concern   Not on file  Social History Narrative   Not on file   Social Determinants of Health   Financial Resource Strain: Low Risk  (09/15/2021)   Overall Financial Resource Strain (CARDIA)    Difficulty of Paying Living Expenses: Not hard at all  Food Insecurity: No Food Insecurity (09/15/2021)   Hunger Vital Sign    Worried About Running Out of Food in the Last Year: Never true    Ran Out of Food in the Last Year: Never true  Transportation Needs: No Transportation Needs (09/15/2021)   PRAPARE - Hydrologist (Medical): No    Lack of Transportation (Non-Medical): No  Physical Activity: Insufficiently Active (09/15/2021)   Exercise Vital Sign    Days of Exercise per Week: 2 days    Minutes of Exercise per Session: 20 min  Stress: No Stress Concern Present (09/15/2021)   [place]    Feeling of Stress : Not at all  Social Connections: Moderately Isolated (09/15/2021)   Social Connection and Isolation Panel [NHANES]    Frequency of Communication with Friends and Family: More than three times a week    Frequency of Social Gatherings with Friends and Family: Three times a week    Attends Religious Services: Never    Active Member of Clubs or Organizations: No    Attends Archivist Meetings: Never    Marital Status: Married    Review of Systems  Constitutional:  Negative for chills, fatigue and fever.  HENT:  Negative for congestion, ear pain, rhinorrhea and sore throat.   Respiratory:  Negative for cough and shortness of breath.   Cardiovascular:  Negative for chest pain.  Gastrointestinal:  Negative for abdominal pain, constipation, diarrhea, nausea and vomiting.  Genitourinary:  Negative for dysuria and urgency.  Musculoskeletal:  Negative for back pain and myalgias.  Neurological:  Negative for dizziness, weakness, light-headedness and headaches.   Psychiatric/Behavioral:  Negative for dysphoric mood. The patient is not nervous/anxious.      Objective:  There were no vitals taken for this visit.     04/26/2022    8:45 AM 04/26/2022    8:21 AM 04/05/2022    1:38 PM  BP/Weight  Systolic BP 686 168 372  Diastolic BP 88 902 111  Wt. (Lbs)  269   BMI  42.77 kg/m2     Physical Exam  Diabetic Foot Exam - Simple   No data filed      Lab Results  Component Value Date   WBC 5.7 04/26/2022   HGB 12.9 04/26/2022   HCT 41.0 04/26/2022   PLT 288 04/26/2022   GLUCOSE 86 04/26/2022   CHOL 221 (H) 04/26/2022   TRIG 75 04/26/2022   HDL 76 04/26/2022   LDLCALC 132 (H) 04/26/2022   ALT 39 (H) 04/26/2022   AST 27 04/26/2022   NA 141 04/26/2022   K 3.6 04/26/2022   CL 101 04/26/2022   CREATININE 1.08 (H) 04/26/2022   BUN 11 04/26/2022   CO2 23 04/26/2022   TSH 2.330 02/25/2022   HGBA1C 5.6 01/13/2022      Assessment & Plan:   Problem List Items Addressed This Visit   None .  No orders of the defined types were placed in this encounter.   No orders of the defined types were placed in this encounter.    Follow-up: No follow-ups on file.  An After Visit Summary was printed and given to the patient.  Rip Harbour, NP [place] [phone]

## 2022-05-25 ENCOUNTER — Ambulatory Visit: Payer: BC Managed Care – PPO | Admitting: Nurse Practitioner

## 2022-05-25 ENCOUNTER — Encounter: Payer: Self-pay | Admitting: Nurse Practitioner

## 2022-05-25 VITALS — BP 142/82 | HR 63 | Temp 97.4°F | Resp 18 | Ht 67.0 in | Wt 268.2 lb

## 2022-05-25 DIAGNOSIS — I10 Essential (primary) hypertension: Secondary | ICD-10-CM

## 2022-05-25 NOTE — Patient Instructions (Signed)
Continue medications Continue to keep BP log Keep appt with cardiology as scheduled Follow-up in 62-month  Managing Your Hypertension Hypertension, also called high blood pressure, is when the force of the blood pressing against the walls of the arteries is too strong. Arteries are blood vessels that carry blood from your heart throughout your body. Hypertension forces the heart to work harder to pump blood and may cause the arteries to become narrow or stiff. Understanding blood pressure readings A blood pressure reading includes a higher number over a lower number: The first, or top, number is called the systolic pressure. It is a measure of the pressure in your arteries as your heart beats. The second, or bottom number, is called the diastolic pressure. It is a measure of the pressure in your arteries as the heart relaxes. For most people, a normal blood pressure is below 120/80. Your personal target blood pressure may vary depending on your medical conditions, your age, and other factors. Blood pressure is classified into four stages. Based on your blood pressure reading, your health care provider may use the following stages to determine what type of treatment you need, if any. Systolic pressure and diastolic pressure are measured in a unit called millimeters of mercury (mmHg). Normal Systolic pressure: below 1637 Diastolic pressure: below 80. Elevated Systolic pressure: 1858-850 Diastolic pressure: below 80. Hypertension stage 1 Systolic pressure: 1277-412 Diastolic pressure: 887-86 Hypertension stage 2 Systolic pressure: 1767or above. Diastolic pressure: 90 or above. How can this condition affect me? Managing your hypertension is very important. Over time, hypertension can damage the arteries and decrease blood flow to parts of the body, including the brain, heart, and kidneys. Having untreated or uncontrolled hypertension can lead to: A heart attack. A stroke. A weakened blood  vessel (aneurysm). Heart failure. Kidney damage. Eye damage. Memory and concentration problems. Vascular dementia. What actions can I take to manage this condition? Hypertension can be managed by making lifestyle changes and possibly by taking medicines. Your health care provider will help you make a plan to bring your blood pressure within a normal range. You may be referred for counseling on a healthy diet and physical activity. Nutrition  Eat a diet that is high in fiber and potassium, and low in salt (sodium), added sugar, and fat. An example eating plan is called the DASH diet. DASH stands for Dietary Approaches to Stop Hypertension. To eat this way: Eat plenty of fresh fruits and vegetables. Try to fill one-half of your plate at each meal with fruits and vegetables. Eat whole grains, such as whole-wheat pasta, brown rice, or whole-grain bread. Fill about one-fourth of your plate with whole grains. Eat low-fat dairy products. Avoid fatty cuts of meat, processed or cured meats, and poultry with skin. Fill about one-fourth of your plate with lean proteins such as fish, chicken without skin, beans, eggs, and tofu. Avoid pre-made and processed foods. These tend to be higher in sodium, added sugar, and fat. Reduce your daily sodium intake. Many people with hypertension should eat less than 1,500 mg of sodium a day. Lifestyle  Work with your health care provider to maintain a healthy body weight or to lose weight. Ask what an ideal weight is for you. Get at least 30 minutes of exercise that causes your heart to beat faster (aerobic exercise) most days of the week. Activities may include walking, swimming, or biking. Include exercise to strengthen your muscles (resistance exercise), such as weight lifting, as part of your weekly exercise routine.  Try to do these types of exercises for 30 minutes at least 3 days a week. Do not use any products that contain nicotine or tobacco. These products  include cigarettes, chewing tobacco, and vaping devices, such as e-cigarettes. If you need help quitting, ask your health care provider. Control any long-term (chronic) conditions you have, such as high cholesterol or diabetes. Identify your sources of stress and find ways to manage stress. This may include meditation, deep breathing, or making time for fun activities. Alcohol use Do not drink alcohol if: Your health care provider tells you not to drink. You are pregnant, may be pregnant, or are planning to become pregnant. If you drink alcohol: Limit how much you have to: 0-1 drink a day for women. 0-2 drinks a day for men. Know how much alcohol is in your drink. In the [place] one drink equals one 12 oz bottle of beer (355 mL), one 5 oz glass of wine (148 mL), or one 1 oz glass of hard liquor (44 mL). Medicines Your health care provider may prescribe medicine if lifestyle changes are not enough to get your blood pressure under control and if: Your systolic blood pressure is 130 or higher. Your diastolic blood pressure is 80 or higher. Take medicines only as told by your health care provider. Follow the directions carefully. Blood pressure medicines must be taken as told by your health care provider. The medicine does not work as well when you skip doses. Skipping doses also puts you at risk for problems. Monitoring Before you monitor your blood pressure: Do not smoke, drink caffeinated beverages, or exercise within 30 minutes before taking a measurement. Use the bathroom and empty your bladder (urinate). Sit quietly for at least 5 minutes before taking measurements. Monitor your blood pressure at home as told by your health care provider. To do this: Sit with your back straight and supported. Place your feet flat on the floor. Do not cross your legs. Support your arm on a flat surface, such as a table. Make sure your upper arm is at heart level. Each time you measure, take two or three  readings one minute apart and record the results. You may also need to have your blood pressure checked regularly by your health care provider. General information Talk with your health care provider about your diet, exercise habits, and other lifestyle factors that may be contributing to hypertension. Review all the medicines you take with your health care provider because there may be side effects or interactions. Keep all follow-up visits. Your health care provider can help you create and adjust your plan for managing your high blood pressure. Where to find more information National Heart, Lung, and Blood Institute: https://wilson-eaton.com/ American Heart Association: www.heart.org Contact a health care provider if: You think you are having a reaction to medicines you have taken. You have repeated (recurrent) headaches. You feel dizzy. You have swelling in your ankles. You have trouble with your vision. Get help right away if: You develop a severe headache or confusion. You have unusual weakness or numbness, or you feel faint. You have severe pain in your chest or abdomen. You vomit repeatedly. You have trouble breathing. These symptoms may be an emergency. Get help right away. Call 911. Do not wait to see if the symptoms will go away. Do not drive yourself to the hospital. Summary Hypertension is when the force of blood pumping through your arteries is too strong. If this condition is not controlled, it may put you  at risk for serious complications. Your personal target blood pressure may vary depending on your medical conditions, your age, and other factors. For most people, a normal blood pressure is less than 120/80. Hypertension is managed by lifestyle changes, medicines, or both. Lifestyle changes to help manage hypertension include losing weight, eating a healthy, low-sodium diet, exercising more, stopping smoking, and limiting alcohol. This information is not intended to replace  advice given to you by your health care provider. Make sure you discuss any questions you have with your health care provider. Document Revised: 06/04/2021 Document Reviewed: 06/04/2021 Elsevier Patient Education  [place].

## 2022-05-31 ENCOUNTER — Other Ambulatory Visit: Payer: Self-pay | Admitting: Nurse Practitioner

## 2022-06-01 ENCOUNTER — Other Ambulatory Visit: Payer: Self-pay | Admitting: Nurse Practitioner

## 2022-06-01 DIAGNOSIS — I10 Essential (primary) hypertension: Secondary | ICD-10-CM

## 2022-06-07 ENCOUNTER — Other Ambulatory Visit: Payer: Self-pay | Admitting: Nurse Practitioner

## 2022-06-07 DIAGNOSIS — I10 Essential (primary) hypertension: Secondary | ICD-10-CM

## 2022-06-18 ENCOUNTER — Other Ambulatory Visit: Payer: Self-pay | Admitting: Nurse Practitioner

## 2022-06-18 DIAGNOSIS — I10 Essential (primary) hypertension: Secondary | ICD-10-CM

## 2022-06-18 DIAGNOSIS — F5101 Primary insomnia: Secondary | ICD-10-CM

## 2022-06-18 DIAGNOSIS — L509 Urticaria, unspecified: Secondary | ICD-10-CM

## 2022-06-18 DIAGNOSIS — R21 Rash and other nonspecific skin eruption: Secondary | ICD-10-CM

## 2022-06-21 DIAGNOSIS — I10 Essential (primary) hypertension: Secondary | ICD-10-CM

## 2022-07-01 ENCOUNTER — Telehealth (HOSPITAL_BASED_OUTPATIENT_CLINIC_OR_DEPARTMENT_OTHER): Payer: Self-pay

## 2022-07-01 ENCOUNTER — Encounter (HOSPITAL_BASED_OUTPATIENT_CLINIC_OR_DEPARTMENT_OTHER): Payer: Self-pay | Admitting: Family

## 2022-07-01 ENCOUNTER — Ambulatory Visit (HOSPITAL_BASED_OUTPATIENT_CLINIC_OR_DEPARTMENT_OTHER): Payer: BC Managed Care – PPO | Admitting: Family

## 2022-07-01 ENCOUNTER — Telehealth: Payer: Self-pay | Admitting: *Deleted

## 2022-07-01 VITALS — BP 152/98 | HR 68 | Ht 67.0 in | Wt 267.0 lb

## 2022-07-01 DIAGNOSIS — G4730 Sleep apnea, unspecified: Secondary | ICD-10-CM | POA: Diagnosis not present

## 2022-07-01 DIAGNOSIS — I10 Essential (primary) hypertension: Secondary | ICD-10-CM

## 2022-07-01 DIAGNOSIS — Z006 Encounter for examination for normal comparison and control in clinical research program: Secondary | ICD-10-CM

## 2022-07-01 DIAGNOSIS — Z720 Tobacco use: Secondary | ICD-10-CM | POA: Diagnosis not present

## 2022-07-01 DIAGNOSIS — R0683 Snoring: Secondary | ICD-10-CM

## 2022-07-01 NOTE — Telephone Encounter (Signed)
Per [employer] [place] no PA is required for itamar. Call reference # [id number]. Staff message sent to Elonda Husky ok to activate device.

## 2022-07-01 NOTE — Research (Signed)
  Subject Name: Amanda Barrett  Noreene Larsson met inclusion and exclusion criteria for the Virtual Care and Social Determinant Interventions for the management of hypertension trial.  The informed consent form, study requirements and expectations were reviewed with the subject by Dr. Oval Linsey and myself. The subject was given the opportunity to read the consent and ask questions. The subject verbalized understanding of the trial requirements.  All questions were addressed prior to the signing of the consent form. The subject agreed to participate in the trial and signed the informed consent. The informed consent was obtained prior to performance of any protocol-specific procedures for the subject.  A copy of the signed informed consent was given to the subject and a copy was placed in the subject's medical record.  [name] was randomized to Group 2.

## 2022-07-01 NOTE — Telephone Encounter (Signed)
Per sleep coordinator patient approved for sleep study, okay to activate with pin [id number]. Patient notified.

## 2022-07-01 NOTE — Progress Notes (Signed)
[facility] Initial Assessment:    Date:  07/01/2022   ID:  Amanda Barrett, DOB Mar 07, 1976, MRN [id number]  PCP:  Rip Harbour, NP  Cardiologist:  None  Nephrologist:  Referring MD: Rip Harbour, NP   CC: Hypertension  History of Present Illness:    Amanda Barrett is a 46 y.o. female with a hx of hypertension, anemia, vitamin D deficiency, depression, tobacco use here to establish care in the [facility].   Amanda Barrett was diagnosed with hypertension when she was 46 years old. It has been previously controlled with medication. Then started ot have some gum problems with Amlodipine. This is gradually improving. Then 01/2022 developed hives including having her face blowing up having to use epipen with ED visit 03/10/22 for angioedema. Her Nifedipine dose was increased - hives went away for a few days then as soon as Prednisone  dose completed has recurrent hives and Nifedipine discontinued. 04/2022 Clonidine initiated which took some time to adjust to. Concerned about interaction between Clonidine and Coreg with rebound hypertension.   She does take her medications 12 hours apart. She is presently taking her Hydralazine twice per day as four times per day was difficult to keep up with.  Notes about 2 hours after taking her medications begins to feel weak, tired.  No near-syncope nor syncope.  No dyspnea, chest pain, orthopnea, PND.  Lower extremity edema only if has long periods of standing which is resolved with elevation.  Blood pressure checked with arm cuff at home. Readings have been elevated. she reports tobacco use quarter pack daily. Alcohol use socially.  Drinks between 3 and 4 glasses of caffeinated tea per day.  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 she eats at home and outside of the home and does follow low sodium diet.   Previous  antihypertensives: Valsartan - allergic reaction (swelling) 01/2022 Nifedipine - allergic reaction (hives, itching) trialed 09/2018, 09/2021, 11/2021 - eventually discontinued 03/26/22 Amlodipine - gingival hyperplasia   Past Medical History:  Diagnosis Date   Anemia    Pernicious   Angio-edema    Hypertension    Recurrent upper respiratory infection (URI)    Urticaria     Past Surgical History:  Procedure Laterality Date   ADENOIDECTOMY     MYOMECTOMY     Uterine Fibroids   SINOSCOPY     SINUS EXPLORATION     TONSILLECTOMY      Current Medications: Current Meds  Medication Sig   albuterol (PROAIR HFA) 108 (90 Base) MCG/ACT inhaler Inhale 2 puffs into the lungs every 4 (four) hours as needed for wheezing or shortness of breath.   buPROPion (WELLBUTRIN SR) 150 MG 12 hr tablet Take 1 tablet (150 mg total) by mouth daily.   buPROPion (WELLBUTRIN XL) 300 MG 24 hr tablet Take 1 tablet (300 mg total) by mouth daily.   carvedilol (COREG) 25 MG tablet TAKE 1 TABLET BY MOUTH TWICE A DAY WITH MEALS   cetirizine (ZYRTEC) 10 MG tablet Take 10 mg by mouth 2 (two) times daily.   cloNIDine (CATAPRES) 0.2 MG tablet TAKE 1 TABLET BY MOUTH 2 TIMES DAILY. (NEED NEW INS)   EPINEPHrine 0.3 mg/0.3 mL IJ SOAJ injection Inject 0.3 mg into the muscle as needed for anaphylaxis.   famotidine (PEPCID) 20 MG tablet TAKE 1 TABLET BY MOUTH TWICE A DAY   fluticasone (FLONASE)  50 MCG/ACT nasal spray Place 2 sprays into both nostrils daily.   hydrALAZINE (APRESOLINE) 25 MG tablet TAKE 1 TABLET (25 MG TOTAL) BY MOUTH 4 (FOUR) TIMES DAILY.   hydrochlorothiazide (HYDRODIURIL) 25 MG tablet Take 1 tablet (25 mg total) by mouth daily.   KLOR-CON M10 10 MEQ tablet TAKE 1 TABLET BY MOUTH TWICE A DAY   triamcinolone cream (KENALOG) 0.1 % APPLY 1 APPLICATION. TOPICALLY 2 (TWO) TIMES DAILY.   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  Tobacco Use   Smoking status: Every Day    Packs/day: 0.25    Years: 20.00    Total pack years: 5.00    Types: Cigarettes    Passive exposure: Current   Smokeless tobacco: Never  Vaping Use   Vaping Use: Never used  Substance and Sexual Activity   Alcohol use: No   Drug use: No   Sexual activity: Yes    Partners: Male    Birth control/protection: None  Other Topics Concern   Not on file  Social History Narrative   Not on file   Social Determinants of Health   Financial Resource Strain: Low Risk  (09/15/2021)   Overall Financial Resource Strain (CARDIA)    Difficulty of Paying Living Expenses: Not hard at all  Food Insecurity: No Food Insecurity (09/15/2021)   Hunger Vital Sign    Worried About Running Out of Food in the Last Year: Never true    Ran Out of Food in the Last Year: Never true  Transportation Needs: No Transportation Needs (09/15/2021)   PRAPARE - Hydrologist (Medical): No    Lack of Transportation (Non-Medical): No  Physical Activity: Insufficiently Active (09/15/2021)   Exercise Vital Sign    Days of Exercise per Week: 2 days    Minutes of Exercise per Session: 20 min  Stress: No Stress Concern Present (09/15/2021)   [place]    Feeling of Stress : Not at all  Social Connections: Moderately Isolated (09/15/2021)   Social Connection and Isolation Panel [NHANES]    Frequency of Communication with Friends and Family: More than three times a week     Frequency of Social Gatherings with Friends and Family: Three times a week    Attends Religious Services: Never    Active Member of Clubs or Organizations: No    Attends Music therapist: Never    Marital Status: Married     Family History: The patient's family history includes Cancer in her maternal grandmother and paternal grandfather; Diabetes in her father, maternal grandfather, maternal grandmother, mother, and paternal grandmother; Heart disease in her maternal grandmother and mother; Hypertension in her father, maternal grandmother, mother, paternal grandfather, and paternal grandmother; Hypotension in her maternal grandfather; Kidney disease in her maternal grandmother; Other in her maternal grandfather; Stroke in her mother.  ROS:   Please  see the history of present illness.     All other systems reviewed and are negative.  EKGs/Labs/Other Studies Reviewed:    EKG:  EKG is ordered today.  The ekg ordered today demonstrates NSR 68 bpm, no acute ST/T wave changes.   11/09/18 VAS US renal  Possible bilateral renal artery stenosis but accuracy of study limited by  vessel  tortuosity.  Consideration of CT Angiogram or contrast angiogram if pt renal function  is  reasonable and still high index of suspicion.    12/18/18 CT angio abdomen pelvis IMPRESSION: VASCULAR   1. No evidence of renal artery stenosis or other lesion to suggest a renovascular component of hypertension.   NON-VASCULAR:.   1. Fatty liver. 2. Scattered descending and sigmoid diverticula. 3. Degenerative disc disease L4-S1.  Recent Labs: 02/25/2022: TSH 2.330 04/26/2022: ALT 39; BUN 11; Creatinine, Ser 1.08; Hemoglobin 12.9; Platelets 288; Potassium 3.6; Sodium 141   Recent Lipid Panel    Component Value Date/Time   CHOL 221 (H) 04/26/2022 0853   TRIG 75 04/26/2022 0853   HDL 76 04/26/2022 0853   CHOLHDL 2.9 04/26/2022 0853   CHOLHDL 3 10/18/2018 1027   VLDL 14.0 10/18/2018 1027    LDLCALC 132 (H) 04/26/2022 0853    Physical Exam:   VS:  BP (!) 152/98 (BP Location: Right Arm, Patient Position: Sitting, Cuff Size: Large)   Pulse 68   Ht '5\' 7"'$  (1.702 m)   Wt 267 lb (121.1 kg)   BMI 41.82 kg/m  , BMI Body mass index is 41.82 kg/m. GENERAL:  Well appearing, overweight HEENT: Pupils equal round and reactive, fundi not visualized, oral mucosa unremarkable NECK:  No jugular venous distention, waveform within normal limits, carotid upstroke brisk and symmetric, no bruits, no thyromegaly LYMPHATICS:  No cervical adenopathy LUNGS:  Clear to auscultation bilaterally HEART:  RRR.  PMI not displaced or sustained,S1 and S2 within normal limits, no S3, no S4, no clicks, no rubs, no murmurs ABD:  Flat, nontender, nondistended  EXT:  2 plus pulses throughout, no edema, no cyanosis no clubbing SKIN:  No rashes no nodules NEURO:  Cranial nerves II through XII grossly intact, motor grossly intact throughout PSYCH:  Cognitively intact, oriented to person place and time   ASSESSMENT/PLAN:    Hypertension -BP not at goal less than 130/80.  She is hesitant to make medication changes as has multiple work events over the the next couple of weeks until October 15.  Notes some fatigue with clonidine.  We will continue carvedilol 25 mg twice daily, clonidine 0.2 mg twice daily, HCTZ 25 mg daily, hydralazine 25 mg twice daily with additional 25 mg as needed for SBP greater than 180.  Would hesitate to use ACE/ARB/Arni due to previous angioedema with valsartan.  Enrolled in RPM today.  She will monitor blood pressure at home twice per day.  After October 15 we will check in via MyChart message and consider transition from hydrochlorothiazide to chlorthalidone and/or switching to clonidine patch and/or increasing hydralazine. Sleep study ordered Prior renal workup unremarkable Cortisol, catecholamines, metanephrines ordered. Consider carotid duplex at f/u.  Sleep disordered breathing/snoring  -STOP-BANG score of 5.  Endorses snoring, not waking feeling well rested.  It [facility] home sleep study provided in clinic today.  Depression-follows with primary care provider  Tobacco use- Smoking cessation encouraged. Recommend utilization of [id number].   Screening for Secondary Hypertension:     07/01/2022    1:31 PM  Causes  Drugs/Herbals Screened  Renovascular HTN  Screened     - Comments 12/2018 negative CT  Sleep Apnea Screened     - Comments 06/2022 [facility] sleep study  Thyroid Disease Screened     - Comments 02/2022 normal TSH  Hyperaldosteronism Screened     - Comments 11/2018 normal renin aldosterone  Pheochromocytoma Screened     - Comments 06/2022 labs ordered  Cushing's Syndrome Screened     - Comments 06/2022 labs ordered  Coarctation of the Aorta Not Screened  Compliance Screened     - Comments 06/2022 Hydralaine BID instead of QID    Relevant Labs/Studies:    Latest Ref Rng & Units 04/26/2022    8:53 AM 02/25/2022    4:38 PM 01/13/2022    9:59 AM  Basic Labs  Sodium 134 - 144 mmol/L 141  143  142   Potassium 3.5 - 5.2 mmol/L 3.6  3.4  3.5   Creatinine 0.57 - 1.00 mg/dL 1.08  0.99  0.86        Latest Ref Rng & Units 02/25/2022    4:38 PM 09/22/2021   10:55 AM  Thyroid   TSH 0.450 - 4.500 uIU/mL 2.330  2.990        Latest Ref Rng & Units 11/06/2018    3:48 PM  Renin/Aldosterone   Aldosterone  ng/dL 21              11/09/2018    9:18 AM  Renovascular   Renal Artery [place] Completed Yes     she consents to be monitored in our remote patient monitoring program through [facility].  she will track his blood pressure twice daily and understands that these trends will help [place] to adjust her medications as needed prior to his next appointment.  she not interested in enrolling in the PREP exercise and nutrition program through the [facility] at this time but will consider once she completes her busy wedding season.   Disposition:    FU with PharmD in 2 months and Dr. Oval Linsey in  4 months   Medication Adjustments/Labs and Tests Ordered: Current medicines are reviewed at length with the patient today.  Concerns regarding medicines are outlined above.  Orders Placed This Encounter  Procedures   Catecholamines, fractionated, plasma   Metanephrines, plasma   Cortisol   Cantril's Ladder Assessment   EKG 12-Lead   [place] Sleep Study   No orders of the defined types were placed in this encounter.    Signed, Loel Dubonnet, NP  07/01/2022 1:32 PM    [facility]

## 2022-07-01 NOTE — Patient Instructions (Addendum)
Medication Instructions:  Your Physician recommend you continue on your current medication as directed.    Please take an extra hydralazine if blood pressure is greater than 180   Labwork: Please return for Lab work in the morning and fasting within the next week or so for Cortisol, metanephrines, and catecholamines. You may come to the...   [facility] (3rd floor) [address], [place], [place] [address]  Open: 8am-Noon and 1pm-4:30pm  Please ring the doorbell on the small table when you exit the elevator and the Lab Tech will come get you  [place] [facility] [address], [place], [place] [address] Open: 8am-1pm, then 2pm-4:30pm   [place]- Please see attached locations sheet stapled to your lab work with address and hours.    Testing:  WatchPAT?  Is a FDA cleared portable home sleep study test that uses a watch and 3 points of contact to monitor 7 different channels, including your heart rate, oxygen saturations, body position, snoring, and chest motion.  The study is easy to use from the comfort of your own home and accurately detect sleep apnea.  Before bed, you attach the chest sensor, attached the sleep apnea bracelet to your nondominant hand, and attach the finger probe.  After the study, the raw data is downloaded from the watch and scored for apnea events.   For more information: [email or web address]  Patient Testing Instructions:  Do not put battery into the device until bedtime when you are ready to begin the test. Please call the support number if you need assistance after following the instructions below: 24 hour support line- [phone] or [facility] support at [phone] (option 2)  Download the The First AmericanWatchPAT One" app through the google play store or App Store  Be sure to turn on or enable access to bluetooth in [place] on your smartphone/ device  Make sure no other bluetooth devices are  on and within the vicinity of your smartphone/ device and WatchPAT watch during testing.  Make sure to leave your smart phone/ device plugged in and charging all night.  When ready for bed:  Follow the instructions step by step in the WatchPAT One App to activate the testing device. For additional instructions, including video instruction, visit the WatchPAT One video on Youtube. You can search for [place] One within Youtube (video is 4 minutes and 18 seconds) or enter: https://youtube/watch?v=BCce_vbiwxE Please note: You will be prompted to enter a Pin to connect via bluetooth when starting the test. The PIN will be assigned to you when you receive the test.  The device is disposable, but it recommended that you retain the device until you receive a call letting you know the study has been received and the results have been interpreted.  We will let you know if the study did not transmit to [place] properly after the test is completed. You do not need to call us to confirm the receipt of the test.  Please complete the test within 48 hours of receiving PIN.   Frequently Asked Questions:  What is Watch [place] one?  A single use fully disposable home sleep apnea testing device and will not need to be returned after completion.  What are the requirements to use WatchPAT one?  The be able to have a successful watchpat one sleep study, you should have your Watch pat one device, your smart phone, watch pat one app, your PIN number and Internet access What type of phone do I need?  You should have a smart phone that uses Android 5.1 and above or any Iphone with IOS 10 and above How can I download the WatchPAT one app?  Based on your device type search for WatchPAT one app either in google play for android devices or APP store for Iphone's Where will I get my PIN for the study?  Your PIN will be provided by your physician's office. It is used for authentication and if you lose/forget your PIN, please reach out  to your providers office.  I do not have Internet at home. Can I do WatchPAT one study?  WatchPAT One needs Internet connection throughout the night to be able to transmit the sleep data. You can use your home/local internet or your cellular's data package. However, it is always recommended to use home/local Internet. It is estimated that between 20MB-30MB will be used with each study.However, the application will be looking for 80MB space in the phone to start the study.  What happens if I lose internet or bluetooth connection?  During the internet disconnection, your phone will not be able to transmit the sleep data. All the data, will be stored in your phone. As soon as the internet connection is back on, the phone will being sending the sleep data. During the bluetooth disconnection, WatchPAT one will not be able to to send the sleep data to your phone. Data will be kept in the [facility] one until two devices have bluetooth connection back on. As soon as the connection is back on, WatchPAT one will send the sleep data to the phone.  How long do I need to wear the WatchPAT one?  After you start the study, you should wear the device at least 6 hours.  How far should I keep my phone from the device?  During the night, your phone should be within 15 feet.  What happens if I leave the room for restroom or other reasons?  Leaving the room for any reason will not cause any problem. As soon as your get back to the room, both devices will reconnect and will continue to send the sleep data. Can I use my phone during the sleep study?  Yes, you can use your phone as usual during the study. But it is recommended to put your watchpat one on when you are ready to go to bed.  How will I get my study results?  A soon as you completed your study, your sleep data will be sent to the provider. They will then share the results with you when they are ready.   Follow-Up: 2 month follow up: TUESDAY NOVEMBER 14TH AT  1:30 AT [facility]   4 month follow up: [place] AT 2:30PM WITH DR. [place] AT [facility]     Referrals:  [facility] for after wedding season!    Special Instructions:   Depending on your blood pressure over the next couple week we could consider: Switching your Hydrochlorothiazide to Chlorthalidone Switching your Clonidine to a weekly patch (either at same dose or a bit lower dose) Increasing your Hydralazine to '50mg'$  twice daily  DASH Eating Plan DASH stands for Dietary Approaches to Stop Hypertension. The DASH eating plan is a healthy eating plan that has been shown to: Reduce high blood pressure (hypertension). Reduce your risk for type 2 diabetes, heart disease, and stroke. Help with weight loss. What are tips for following this plan? Reading food labels  Check food labels for the amount of salt (sodium) per serving. Choose foods with less than 5 percent of the Daily Value of sodium. Generally, foods with less than 300 milligrams (mg) of sodium per serving fit into this eating plan. To find whole grains, look for the word "whole" as the first word in the ingredient list. Shopping Buy products labeled as "low-sodium" or "no salt added." Buy fresh foods. Avoid canned foods and pre-made or frozen meals. Cooking Avoid adding salt when cooking. Use salt-free seasonings or herbs instead of table salt or sea salt. Check with your health care provider or pharmacist before using salt substitutes. Do not fry foods. Cook foods using healthy methods such as baking, boiling, grilling, roasting, and broiling instead. Cook with heart-healthy oils, such as olive, canola, avocado, soybean, or sunflower oil. Meal planning  Eat a balanced diet that includes: 4 or more servings of fruits and 4 or more servings of vegetables each day. Try to fill one-half of your plate with fruits and vegetables. 6-8 servings of whole  grains each day. Less than 6 oz (170 g) of lean meat, poultry, or fish each day. A 3-oz (85-g) serving of meat is about the same size as a deck of cards. One egg equals 1 oz (28 g). 2-3 servings of low-fat dairy each day. One serving is 1 cup (237 mL). 1 serving of nuts, seeds, or beans 5 times each week. 2-3 servings of heart-healthy fats. Healthy fats called omega-3 fatty acids are found in foods such as walnuts, flaxseeds, fortified milks, and eggs. These fats are also found in cold-water fish, such as sardines, salmon, and mackerel. Limit how much you eat of: Canned or prepackaged foods. Food that is high in trans fat, such as some fried foods. Food that is high in saturated fat, such as fatty meat. Desserts and other sweets, sugary drinks, and other foods with added sugar. Full-fat dairy products. Do not salt foods before eating. Do not eat more than 4 egg yolks a week. Try to eat at least 2 vegetarian meals a week. Eat more home-cooked food and less restaurant, buffet, and fast food. Lifestyle When eating at a restaurant, ask that your food be prepared with less salt or no salt, if possible. If you drink alcohol: Limit how much you use to: 0-1 drink a day for women who are not pregnant. 0-2 drinks a day for men. Be aware of how much alcohol is in your drink. In the [place] one drink equals one 12 oz bottle of beer (355 mL), one 5 oz glass of wine (148 mL), or one 1 oz glass of hard liquor (44 mL). General information Avoid eating more than 2,300 mg of salt a day. If you have hypertension, you may need to reduce your sodium intake to 1,500 mg a day. Work with your health care provider to maintain a healthy body weight or to lose weight. Ask what an ideal weight is for you. Get at least 30 minutes of exercise that causes your heart to beat faster (aerobic exercise) most days of the week. Activities may include walking, swimming, or biking. Work with your health care provider or dietitian  to adjust your eating plan to your individual calorie needs. What foods should I eat? Fruits All fresh, dried, or frozen fruit. Canned fruit in natural juice (without added sugar). Vegetables Fresh or frozen vegetables (raw, steamed, roasted, or grilled). Low-sodium or reduced-sodium tomato and vegetable juice. Low-sodium or reduced-sodium tomato sauce and tomato paste.  Low-sodium or reduced-sodium canned vegetables. Grains Whole-grain or whole-wheat bread. Whole-grain or whole-wheat pasta. Brown rice. [place]. Bulgur. Whole-grain and low-sodium cereals. Pita bread. Low-fat, low-sodium crackers. Whole-wheat flour tortillas. Meats and other proteins Skinless chicken or [place]. Ground chicken or [place]. Pork with fat trimmed off. Fish and seafood. Egg whites. Dried beans, peas, or lentils. Unsalted nuts, nut butters, and seeds. Unsalted canned beans. Lean cuts of beef with fat trimmed off. Low-sodium, lean precooked or cured meat, such as sausages or meat loaves. Dairy Low-fat (1%) or fat-free (skim) milk. Reduced-fat, low-fat, or fat-free cheeses. Nonfat, low-sodium ricotta or cottage cheese. Low-fat or nonfat yogurt. Low-fat, low-sodium cheese. Fats and oils Soft margarine without trans fats. Vegetable oil. Reduced-fat, low-fat, or light mayonnaise and salad dressings (reduced-sodium). Canola, safflower, olive, avocado, soybean, and sunflower oils. Avocado. Seasonings and condiments Herbs. Spices. Seasoning mixes without salt. Other foods Unsalted popcorn and pretzels. Fat-free sweets. The items listed above may not be a complete list of foods and beverages you can eat. Contact a dietitian for more information. What foods should I avoid? Fruits Canned fruit in a light or heavy syrup. Fried fruit. Fruit in cream or butter sauce. Vegetables Creamed or fried vegetables. Vegetables in a cheese sauce. Regular canned vegetables (not low-sodium or reduced-sodium). Regular canned tomato sauce  and paste (not low-sodium or reduced-sodium). Regular tomato and vegetable juice (not low-sodium or reduced-sodium). Angie Fava. Olives. Grains Baked goods made with fat, such as croissants, muffins, or some breads. Dry pasta or rice meal packs. Meats and other proteins Fatty cuts of meat. Ribs. Fried meat. Berniece Salines. Bologna, salami, and other precooked or cured meats, such as sausages or meat loaves. Fat from the back of a pig (fatback). Bratwurst. Salted nuts and seeds. Canned beans with added salt. Canned or smoked fish. Whole eggs or egg yolks. Chicken or [place] with skin. Dairy Whole or 2% milk, cream, and half-and-half. Whole or full-fat cream cheese. Whole-fat or sweetened yogurt. Full-fat cheese. Nondairy creamers. Whipped toppings. Processed cheese and cheese spreads. Fats and oils Butter. Stick margarine. Lard. Shortening. Ghee. Bacon fat. Tropical oils, such as coconut, palm kernel, or palm oil. Seasonings and condiments Onion salt, garlic salt, seasoned salt, table salt, and sea salt. Worcestershire sauce. Tartar sauce. Barbecue sauce. Teriyaki sauce. Soy sauce, including reduced-sodium. Steak sauce. Canned and packaged gravies. Fish sauce. Oyster sauce. Cocktail sauce. Store-bought horseradish. Ketchup. Mustard. Meat flavorings and tenderizers. Bouillon cubes. Hot sauces. Pre-made or packaged marinades. Pre-made or packaged taco seasonings. Relishes. Regular salad dressings. Other foods Salted popcorn and pretzels. The items listed above may not be a complete list of foods and beverages you should avoid. Contact a dietitian for more information. Where to find more information National Heart, Lung, and Blood Institute: https://Valerie Cones-eaton.com/ American Heart Association: www.heart.org Academy of Nutrition and Dietetics: www.eatright.[place]: www.kidney.org Summary The DASH eating plan is a healthy eating plan that has been shown to reduce high blood pressure (hypertension).  It may also reduce your risk for type 2 diabetes, heart disease, and stroke. When on the DASH eating plan, aim to eat more fresh fruits and vegetables, whole grains, lean proteins, low-fat dairy, and heart-healthy fats. With the DASH eating plan, you should limit salt (sodium) intake to 2,300 mg a day. If you have hypertension, you may need to reduce your sodium intake to 1,500 mg a day. Work with your health care provider or dietitian to adjust your eating plan to your individual calorie needs. This information is not intended to  replace advice given to you by your health care provider. Make sure you discuss any questions you have with your health care provider. Document Revised: 08/24/2019 Document Reviewed: 08/24/2019 Elsevier Patient Education  [place].

## 2022-07-02 ENCOUNTER — Telehealth: Payer: Self-pay | Admitting: *Deleted

## 2022-07-02 ENCOUNTER — Encounter (HOSPITAL_BASED_OUTPATIENT_CLINIC_OR_DEPARTMENT_OTHER): Payer: Self-pay | Admitting: Family

## 2022-07-02 NOTE — Telephone Encounter (Signed)
Left voicemail with patient to get in touch with me via vivify chat/text. Blood pressure high late last night. Asked her to let me know how she felt and to please repeat a blood pressure.   Secure chat to Dr Oval Linsey, Laurann Montana NP and Tommy Medal RPh.   Laurann Montana responded back. Pt was seen in office yesterday and was given instructions for additional hydralazine when parameter met.

## 2022-07-06 ENCOUNTER — Encounter (HOSPITAL_BASED_OUTPATIENT_CLINIC_OR_DEPARTMENT_OTHER): Payer: BC Managed Care – PPO | Admitting: Cardiology

## 2022-07-06 ENCOUNTER — Telehealth: Payer: Self-pay

## 2022-07-06 DIAGNOSIS — Z0000 Encounter for general adult medical examination without abnormal findings: Secondary | ICD-10-CM

## 2022-07-06 DIAGNOSIS — G4733 Obstructive sleep apnea (adult) (pediatric): Secondary | ICD-10-CM | POA: Diagnosis not present

## 2022-07-06 NOTE — Telephone Encounter (Signed)
Called patient for Welcome call and offer health coaching regarding her survey responses that she is interested in smoking cessation, increasing physical activity, and establishing an eating plan. Patient is interested in having her prompt times changed due to her schedule. Patient is also interested in health coaching to work towards these goals. Patient requested to schedule a health coaching session on 11/14 after her pharmacy appt at [facility]. Patient stated that her Wellbutrin dose was increased and she can tell a difference in how much less she smokes. Patient's prompt times have been adjusted for her am and pm readings. Patient has been scheduled for an in-person session on the date requested.   Fenix Rorke [facility] Guide, Health Coach [address] [place] [address] Telephone: [phone] Email: [email or web address]

## 2022-07-07 NOTE — Procedures (Signed)
   Patient Information Study Date: 07/06/22 Patient Name: Amanda Barrett Patient ID: [id number] Birth Date: [birth year] Age: 46 Gender: Female BMI: 41.9 (W=267 lb, H=5' 7'') Referring Physician: Laurann Montana, NP  TEST DESCRIPTION: Home sleep apnea testing was completed using the WatchPat, a Type 1 device, utilizing peripheral arterial tonometry (PAT), chest movement, actigraphy, pulse oximetry, pulse rate, body position and snore. AHI was calculated with apnea and hypopnea using valid sleep time as the denominator. RDI includes apneas, hypopneas, and RERAs. The data acquired and the scoring of sleep and all associated events were performed in accordance with the recommended standards and specifications as outlined in the AASM Manual for the Scoring of Sleep and Associated Events 2.2.0 (2015).   FINDINGS:   1. Mild Obstructive Sleep Apnea with AHI 8.4/hr.   2. No Central Sleep Apnea with pAHIc 0.1/hr.   3. Oxygen desaturations as low as 86%.   4. Severe snoring was present. O2 sats were < 88% for 0 min.   5. Total sleep time was 7 hrs and 8 min.   6. 36.5% of total sleep time was spent in REM sleep.   7. Shortened sleep onset latency at 6 min.   8. Prolonged REM sleep onset latency at 143 min.   9. Total awakenings were 6.  10. Arrhythmia detection:  None.  DIAGNOSIS: Mild Obstructive Sleep Apnea (G47.33)  RECOMMENDATIONS:   1.  Clinical correlation of these findings is necessary.  The decision to treat obstructive sleep apnea (OSA) is usually based on the presence of apnea symptoms or the presence of associated medical conditions such as Hypertension, Congestive Heart Failure, Atrial Fibrillation or Obesity.  The most common symptoms of OSA are snoring, gasping for breath while sleeping, daytime sleepiness and fatigue.   2.  Initiating apnea therapy is recommended given the presence of symptoms and/or associated conditions. Recommend proceeding with one of the following:     a.   Auto-CPAP therapy with a pressure range of 5-20cm H2O.     b.  An oral appliance (OA) that can be obtained from certain dentists with expertise in sleep medicine.  These are primarily of use in non-obese patients with mild and moderate disease.     c.  An ENT consultation which may be useful to look for specific causes of obstruction and possible treatment options.     d.  If patient is intolerant to PAP therapy, consider referral to ENT for evaluation for hypoglossal nerve stimulator.   3.  Close follow-up is necessary to ensure success with CPAP or oral appliance therapy for maximum benefit.  4.  A follow-up oximetry study on CPAP is recommended to assess the adequacy of therapy and determine the need for supplemental oxygen or the potential need for Bi-level therapy.  An arterial blood gas to determine the adequacy of baseline ventilation and oxygenation should also be considered.  5.  Healthy sleep recommendations include:  adequate nightly sleep (normal 7-9 hrs/night), avoidance of caffeine after noon and alcohol near bedtime, and maintaining a sleep environment that is cool, dark and quiet.  6.  Weight loss for overweight patients is recommended.  Even modest amounts of weight loss can significantly improve the severity of sleep apnea.  7.  Snoring recommendations include:  weight loss where appropriate, side sleeping, and avoidance of alcohol before bed.  8.  Operation of motor vehicle should be avoided when sleepy.  Signature: Fransico Him, MD; [facility]; [place], [place] Board of Sleep Medicine Electronically Signed: 07/07/22

## 2022-07-08 ENCOUNTER — Ambulatory Visit: Payer: BC Managed Care – PPO | Attending: Family

## 2022-07-08 DIAGNOSIS — R0683 Snoring: Secondary | ICD-10-CM

## 2022-07-08 DIAGNOSIS — G4730 Sleep apnea, unspecified: Secondary | ICD-10-CM

## 2022-07-09 ENCOUNTER — Telehealth: Payer: Self-pay | Admitting: *Deleted

## 2022-07-09 NOTE — Telephone Encounter (Signed)
Patient notified of sleep study results and recommendations. She agrees to proceed with CPAP therapy. Orders have been entered into Epic by Dr Radford Pax. Brad New and Darlina Guys with [place] made aware via community message the orders are present and ready for processing.

## 2022-07-09 NOTE — Telephone Encounter (Signed)
-----   Message from Sueanne Margarita, MD sent at 07/07/2022  9:01 AM EDT ----- Please let patient know that they have sleep apnea and recommend treating with CPAP.  Please order an auto CPAP from 4-15cm H2O with heated humidity and mask of choice.  Order overnight pulse ox on CPAP.  Followup with me in 6 weeks.

## 2022-07-14 ENCOUNTER — Encounter (HOSPITAL_BASED_OUTPATIENT_CLINIC_OR_DEPARTMENT_OTHER): Payer: Self-pay

## 2022-07-14 DIAGNOSIS — I1A0 Resistant hypertension: Secondary | ICD-10-CM

## 2022-07-15 MED ORDER — HYDRALAZINE HCL 50 MG PO TABS: 50.0000 mg | ORAL_TABLET | Freq: Two times a day (BID) | ORAL | 1 refills | Status: DC

## 2022-07-19 ENCOUNTER — Telehealth: Payer: Self-pay

## 2022-07-19 NOTE — Telephone Encounter (Signed)
Spoke to [employer] by telephone. She started taking hydralazine '50mg'$  BID instead of '25mg'$  BID this morning. First BP this morning was before she took her hydralazine. We discussed reaching out to the office if she were having any symptoms of low or high BP and seeking emergency help if needed. Will continue to monitor and track vital signs.

## 2022-07-21 DIAGNOSIS — I10 Essential (primary) hypertension: Secondary | ICD-10-CM

## 2022-08-02 DIAGNOSIS — G4733 Obstructive sleep apnea (adult) (pediatric): Secondary | ICD-10-CM | POA: Diagnosis not present

## 2022-08-04 ENCOUNTER — Telehealth (HOSPITAL_BASED_OUTPATIENT_CLINIC_OR_DEPARTMENT_OTHER): Payer: Self-pay

## 2022-08-04 DIAGNOSIS — I1A0 Resistant hypertension: Secondary | ICD-10-CM

## 2022-08-04 MED ORDER — HYDRALAZINE HCL 50 MG PO TABS: 50.0000 mg | ORAL_TABLET | Freq: Three times a day (TID) | ORAL | 3 refills | Status: DC

## 2022-08-04 NOTE — Telephone Encounter (Signed)
Returned call to patient and provided the following recommendations. Rx updated. Patient going to finish using up her '25mg'$  tablets then will pick up new prescription.    "Appreciate update.    Recommend she increase Hydralazine to '50mg'$  three times per day.    Loel Dubonnet, NP "

## 2022-08-04 NOTE — Addendum Note (Signed)
Addended by: Gerald Stabs on: 08/04/2022 04:40 PM   Modules accepted: Orders

## 2022-08-04 NOTE — Telephone Encounter (Signed)
Returned call to patient. Patient states that she is doing okay. Patient states she is more conscious of her blood pressure than she used to be. Patient states that her medications seem to work better in the morning but she doesn't understand why she can't get control of her pressure in the evening hours. She confirmed for me she is taking all her medications. Patient cannot think of anything different that happened Monday and Tuesday that would have made her pressure so much better.    ----- Message from Loel Dubonnet, NP sent at 08/04/2022  2:10 PM EDT ----- Regarding: RE: So many bp red alerts. Thanks for the update! I'll ask Atticus Lemberger to reach out to her.    Daphene Jaeger - can we call her to check in? BP overall 150s-180s/100s but then she had BP 110/70-122/77 Monday and Tuesday of this week. Can we ensure she is taking medications as prescribed and if anything different happening those two days? (Lightheadedness, diarrhea, illness, etc). Can we also please remind her to have her labs done. Pending how she is feeling will make medication changes.    Thanks,  Loel Dubonnet, NP

## 2022-08-04 NOTE — Telephone Encounter (Signed)
Appreciate update.   Recommend she increase Hydralazine to '50mg'$  three times per day.   Loel Dubonnet, NP

## 2022-08-04 NOTE — Telephone Encounter (Addendum)
Left message for patient to call back and sent mychart message        ----- Message from Loel Dubonnet, NP sent at 08/04/2022  2:10 PM EDT ----- Regarding: RE: So many bp red alerts. Thanks for the update! I'll ask Marisella Puccio to reach out to her.   Amanda Barrett - can we call her to check in? BP overall 150s-180s/100s but then she had BP 110/70-122/77 Monday and Tuesday of this week. Can we ensure she is taking medications as prescribed and if anything different happening those two days? (Lightheadedness, diarrhea, illness, etc). Can we also please remind her to have her labs done. Pending how she is feeling will make medication changes.   Thanks,  Loel Dubonnet, NP

## 2022-08-17 ENCOUNTER — Other Ambulatory Visit: Payer: Self-pay | Admitting: Nurse Practitioner

## 2022-08-17 ENCOUNTER — Encounter: Payer: Self-pay | Admitting: Pharmacist Clinician (PhC)/ Clinical Pharmacy Specialist

## 2022-08-17 ENCOUNTER — Ambulatory Visit
Payer: BC Managed Care – PPO | Attending: Internal Medicine | Admitting: Pharmacist Clinician (PhC)/ Clinical Pharmacy Specialist

## 2022-08-17 ENCOUNTER — Ambulatory Visit (INDEPENDENT_AMBULATORY_CARE_PROVIDER_SITE_OTHER): Payer: BC Managed Care – PPO

## 2022-08-17 VITALS — BP 149/88 | HR 60

## 2022-08-17 DIAGNOSIS — I10 Essential (primary) hypertension: Secondary | ICD-10-CM | POA: Diagnosis not present

## 2022-08-17 DIAGNOSIS — Z0000 Encounter for general adult medical examination without abnormal findings: Secondary | ICD-10-CM

## 2022-08-17 DIAGNOSIS — F331 Major depressive disorder, recurrent, moderate: Secondary | ICD-10-CM

## 2022-08-17 MED ORDER — CLONIDINE 0.2 MG/24HR TD PTWK: 0.2000 mg | MEDICATED_PATCH | TRANSDERMAL | 12 refills | Status: DC

## 2022-08-17 MED ORDER — CHLORTHALIDONE 25 MG PO TABS: 25.0000 mg | ORAL_TABLET | Freq: Every day | ORAL | 3 refills | Status: DC

## 2022-08-17 NOTE — Assessment & Plan Note (Signed)
Patient with uncontrolled hypertension currently on 4 medications.  Will have her switch hydrochlorothiazide to chlorthalidone 25 mg daily and switch the clonidine 0.2 mg to patches to see if this helps with her daytime drowsiness.  She will need to taper off the tablets as follows:  Day 1: Place transdermal patch; take normal clonidine dose (0.2 mg bid) Day 2: Patch remains; take clonidine 0.1 mg bid Day 3: Patch remains; take 0.1 mg at bedtime Day 4: Patch remains, no further oral dosing  She will continue to track home BP readings in [place] and follow up with Dr. Oval Linsey in January.

## 2022-08-17 NOTE — Progress Notes (Signed)
Appointment Outcome: Completed, Session #: Initial                        Start time: 2:22pm   End time: 3:10pm   Total Mins: 48 minutes  AGREEMENTS SECTION   Overall Goal(s): Smoking cessation Increase physical activity Improve healthy eating habits                                         Agreement/Action Steps Smoking Cessation Practice mini quits Track time in between cigarettes Cut back to 4 cigarettes/day Take Wellbutrin as prescribed  Increase Physical Activity Exercise 3xs/week for 30 minutes between 8-9am Organize workouts around event days [employer] videos on [place] portion control Use containers Read food labels Aim for '1500mg'$  of sodium/day Meal planning Incorporate protein shakes with fruits and vegetables  Progress Notes:  Patient was mailed a copy of the Welcome Letter and Code of Ethics prior to appointment and was reviewed at the beginning of the session. Patient expressed awareness of knowing what changes she need to make with her goals to improve her health/blood pressure. Patient stated that she would feel better if she were to quit smoking and lose weight.   Patient shared that she was able to lose weight previously by exercising with the [facility] program. Patient described the program in detail and shared that the program incorporates portion control, meal planning, and protein shakes with fruits and vegetables. Patient stated that there was an app that was used with the program to track progress.  Patient stated that working out in the mornings between 8-9am would be best. Patient stated that the videos she used were around 30 minutes. Patient reported that on some of her event days she can walk between 3-8 miles. Patient is wanting to be physically active every day and stated that she will plan her exercise around her events due to the amount of walking she does on those days.   Patient  mentioned that her challenge with meal planning is that her husband eats differently, and she makes additional meals for him. Patient stated that she has started reading food labels more recently. Patient shared that she does not add salt to her food. Patient schedule sometimes causes her to eat outside of her home due to various events. Patient stated that her provider recommended the DASH diet.  Patient explained how she copes with stress by removing herself from a situation, taking deep breathes, and regaining her composure. Patient stated that these steps have helped her manage her stress without having to smoke. Patient mentioned that she is taking Wellbutrin and it has reduced her urge to smoke and causes the cigarette to taste bad. Patient explained that her smoking triggers are socializing and not being busy. Patient mentioned that she typically does not smoke while working because she is busy. Patient reported that she is currently smoking about 5 cigarettes daily but does not smoke the whole cigarette because some of it burns up.  Coaching Outcomes: Patient has adopted her initial action steps towards smoking cessation, increasing physical activity, and improving healthy eating habits over the next two weeks as outlined above.   Patient will research on YouTube, exercise videos from the [employer] and [employer].  Patient will look for containers that she used previously to practice  portion control.  Patient will read food labels to track how much sodium she consumes in a day and aim for '1500mg'$  of sodium/day.   Patient was provided the Quit4Life workbook for smoking cessation to review.   Patient will track the time between when she is smoking a cigarette by practicing mini quits. Patient will talk to her friends so they can support her choice to reduce her smoking.   Patient will work to reduce her smoking over the next three months until she achieves smoking cessation. Patient will cut  her smoking down to 4 cigarettes per day over the next two weeks.

## 2022-08-17 NOTE — Progress Notes (Signed)
08/17/2022 Noreene Larsson 12-13-1975 [id number]   HPI:  Amanda Barrett is a 46 y.o. female patient of Dr Oval Linsey, with a [place] below who presents today for advanced hypertension clinic follow up.  Patient was referred to our clinic by Jerrell Belfast NP.   Patient reported she was first diagnosed with hypertension around the age of 58, and had been controlled for some time.  Unfortunately she has had allergic reactions to both ARBs and CCB's, which leaves her without the strongest BP lowering meds.  She states no issues with medication compliance, although the combination of hydralazine and clonidine makes her rather drowsy in the mornings for a few hours.    Blood Pressure Goal:  130/80  Current Medications:   carvedilol 25 mg bid, clonidine 0.2 mg bid, hydralazine 25 mg bid (+ 25 mg prn SBP > 180), HCTZ 25 mg qd  Family Hx:  both parents with hypertension; mother died after CABG infection, DM complications; dad's pressure controlled, no siblings, no children   Social Hx:  tobacco ~1/4 ppd currently; rare alcohol; un-sweet tea regularly; rare soda, coffee on occasion     Diet:  mix of home and eating out, no added salt, vegetables fresh; variety of proteins ; doesn't snack much, prefers sweet to salty    Exercise: no regular exercise - works as event florist   Home BP readings: in [place] 14 day average 157/107 (range 119-186/80-129) HR 61 30 day average 161/109 (range 110-190/70-130) HR 61  Intolerances:  amlodipine - gingival hyperplasia; nifedipine - hives/angioedema; valsartan - allergic reaction/swelling  Labs: 04/26/22:  Na 141, K 3.6, Glu 86, BUN 11, SCr 1.08, GFR 65   Wt Readings from Last 3 Encounters:  07/01/22 267 lb (121.1 kg)  05/25/22 268 lb 3.2 oz (121.7 kg)  04/26/22 269 lb (122 kg)   BP Readings from Last 3 Encounters:  08/17/22 (!) 149/88  07/01/22 (!) 152/98  05/25/22 (!) 142/82   Pulse Readings from Last 3 Encounters:  08/17/22 60  07/01/22 68  05/25/22  63    Current Outpatient Medications  Medication Sig Dispense Refill   albuterol (PROAIR HFA) 108 (90 Base) MCG/ACT inhaler Inhale 2 puffs into the lungs every 4 (four) hours as needed for wheezing or shortness of breath. 1 each 2   buPROPion (WELLBUTRIN SR) 150 MG 12 hr tablet Take 1 tablet (150 mg total) by mouth daily. 90 tablet 1   buPROPion (WELLBUTRIN XL) 300 MG 24 hr tablet Take 1 tablet (300 mg total) by mouth daily. 90 tablet 1   carvedilol (COREG) 25 MG tablet TAKE 1 TABLET BY MOUTH TWICE A DAY WITH MEALS 60 tablet 4   cetirizine (ZYRTEC) 10 MG tablet Take 10 mg by mouth daily.     chlorthalidone (HYGROTON) 25 MG tablet Take 1 tablet (25 mg total) by mouth daily. 90 tablet 3   cloNIDine (CATAPRES - DOSED IN MG/24 HR) 0.2 mg/24hr patch Place 1 patch (0.2 mg total) onto the skin once a week. 4 patch 12   EPINEPHrine 0.3 mg/0.3 mL IJ SOAJ injection Inject 0.3 mg into the muscle as needed for anaphylaxis. 1 each 1   famotidine (PEPCID) 20 MG tablet TAKE 1 TABLET BY MOUTH TWICE A DAY (Patient taking differently: Take 20 mg by mouth daily.) 60 tablet 2   fluticasone (FLONASE) 50 MCG/ACT nasal spray Place 2 sprays into both nostrils daily. 16 g 0   hydrALAZINE (APRESOLINE) 50 MG tablet Take 1 tablet (50 mg  total) by mouth 3 (three) times daily. 270 tablet 3   hydrochlorothiazide (HYDRODIURIL) 25 MG tablet Take 1 tablet (25 mg total) by mouth daily. 90 tablet 3   KLOR-CON M10 10 MEQ tablet TAKE 1 TABLET BY MOUTH TWICE A DAY 60 tablet 3   triamcinolone cream (KENALOG) 0.1 % APPLY 1 APPLICATION. TOPICALLY 2 (TWO) TIMES DAILY. 30 g 0   Vitamin D, Ergocalciferol, (DRISDOL) 1.25 MG (50000 UNIT) CAPS capsule TAKE 1 CAPSULE (50,000 UNITS TOTAL) BY MOUTH EVERY 7 (SEVEN) DAYS 4 capsule 2   zolpidem (AMBIEN) 10 MG tablet TAKE 1 TABLET BY MOUTH EVERYDAY AT BEDTIME 30 tablet 1   No current facility-administered medications for this visit.    Allergies  Allergen Reactions   Valsartan Swelling    Nifedipine Hives and Itching    Past Medical History:  Diagnosis Date   Anemia    Pernicious   Angio-edema    Hypertension    Recurrent upper respiratory infection (URI)    Urticaria     Blood pressure (!) 149/88, pulse 60.   Essential hypertension Patient with uncontrolled hypertension currently on 4 medications.  Will have her switch hydrochlorothiazide to chlorthalidone 25 mg daily and switch the clonidine 0.2 mg to patches to see if this helps with her daytime drowsiness.  She will need to taper off the tablets as follows:  Day 1: Place transdermal patch; take normal clonidine dose (0.2 mg bid) Day 2: Patch remains; take clonidine 0.1 mg bid Day 3: Patch remains; take 0.1 mg at bedtime Day 4: Patch remains, no further oral dosing  She will continue to track home BP readings in [place] and follow up with Dr. Oval Linsey in January.       Tommy Medal PharmD CPP [place] [address] [place], [place] [address] [phone]

## 2022-08-17 NOTE — Patient Instructions (Signed)
Return for a a follow up appointment with Dr. Oval Linsey January 8 at the [facility] your blood pressure at home daily and keep record of the readings.  Take your BP meds as follows:  Start clonidine 0.2 mg patch once weekly  Day 1: Place transdermal patch; take clonidine tablets morning and evening          Day 2: Patch remains; take 1/2 tablet of clonidine morning and evening          Day 3: Patch remains; take 1/2 tablet of clonidine in the evening Replace the patch every 7 days.   Stop hydrochlorothiazide  Start chlorthalidone 25 mg once daily in the morning    Bring all of your meds, your BP cuff and your record of home blood pressures to your next appointment.  Exercise as you're able, try to walk approximately 30 minutes per day.  Keep salt intake to a minimum, especially watch canned and prepared boxed foods.  Eat more fresh fruits and vegetables and fewer canned items.  Avoid eating in fast food restaurants.    HOW TO TAKE YOUR BLOOD PRESSURE: Rest 5 minutes before taking your blood pressure.  Don't smoke or drink caffeinated beverages for at least 30 minutes before. Take your blood pressure before (not after) you eat. Sit comfortably with your back supported and both feet on the floor (don't cross your legs). Elevate your arm to heart level on a table or a desk. Use the proper sized cuff. It should fit smoothly and snugly around your bare upper arm. There should be enough room to slip a fingertip under the cuff. The bottom edge of the cuff should be 1 inch above the crease of the elbow. Ideally, take 3 measurements at one sitting and record the average.

## 2022-08-18 ENCOUNTER — Other Ambulatory Visit: Payer: Self-pay | Admitting: Nurse Practitioner

## 2022-08-18 DIAGNOSIS — I10 Essential (primary) hypertension: Secondary | ICD-10-CM | POA: Diagnosis not present

## 2022-08-18 DIAGNOSIS — F331 Major depressive disorder, recurrent, moderate: Secondary | ICD-10-CM

## 2022-08-18 MED ORDER — BUPROPION HCL ER (SR) 150 MG PO TB12: 150.0000 mg | ORAL_TABLET | Freq: Every day | ORAL | 1 refills | Status: DC

## 2022-08-20 DIAGNOSIS — I10 Essential (primary) hypertension: Secondary | ICD-10-CM

## 2022-08-23 ENCOUNTER — Other Ambulatory Visit: Payer: Self-pay | Admitting: Family Medicine

## 2022-08-23 ENCOUNTER — Other Ambulatory Visit: Payer: Self-pay | Admitting: Nurse Practitioner

## 2022-08-23 DIAGNOSIS — F5101 Primary insomnia: Secondary | ICD-10-CM

## 2022-08-30 DIAGNOSIS — G4733 Obstructive sleep apnea (adult) (pediatric): Secondary | ICD-10-CM | POA: Diagnosis not present

## 2022-08-30 LAB — CATECHOLAMINES, FRACTIONATED, PLASMA
Dopamine: <30 pg/mL (ref 0–48)
Epinephrine: 18 pg/mL (ref 0–62)
Norepinephrine: 266 pg/mL (ref 0–874)

## 2022-08-30 LAB — METANEPHRINES, PLASMA
Metanephrine, Free: <25 pg/mL (ref 0.0–88.0)
Normetanephrine, Free: 36 pg/mL (ref 0.0–218.9)

## 2022-08-30 LAB — CORTISOL: Cortisol: 5.9 ug/dL — ABNORMAL LOW (ref 6.2–19.4)

## 2022-08-30 NOTE — Progress Notes (Unsigned)
Subjective:  Patient ID: Amanda Barrett, female    DOB: [birth year]  Age: 46 y.o. MRN: [id number]  CC: Hypertension GERD Depression  HPI Pt presents for chronic follow-up of HTN, GERD, and depression. She is in a clinical research study with the blood pressure clinic. Antihypertensive medications adjusted on 08/17/22 per pharmacist at BP clinic. BP elevated in-office today 142/104. She denies chest pain, dyspnea, or headaches.    Hypertension, follow-up:  She was last seen for hypertension 3 months ago.  BP at that visit was 142/82. Management includes Coreg  25 mg daily, Chlorthalidone 25 mg daily, Clonidine  patch 0.2 mg weekly.  She reports excellent compliance with treatment. She is not having side effects.  She is following a Regular diet. She is not exercising. She does smoke.  Use of agents associated with hypertension: none.    Pertinent labs: Lab Results  Component Value Date   CHOL 221 (H) 04/26/2022   HDL 76 04/26/2022   LDLCALC 132 (H) 04/26/2022   TRIG 75 04/26/2022   CHOLHDL 2.9 04/26/2022   Lab Results  Component Value Date   NA 141 04/26/2022   K 3.6 04/26/2022   CREATININE 1.08 (H) 04/26/2022   EGFR 65 04/26/2022   GLUCOSE 86 04/26/2022     The 10-year ASCVD risk score (Arnett DK, et al., 2019) is: 3.4%    GERD, Follow up:  The patient was last seen for GERD 3 months ago. Current treatment consist of: Pepcid 20 mg daily She reports excellent compliance with treatment. She is not having side effects. . She is NOT experiencing belching and eructation   Depression, Follow-up  She  was last seen for this 3 months ago. Current treatment includes wellbutrin 450 mg daily.   She reports excellent compliance with treatment. She is not having side effects.   She reports excellent tolerance of treatment. Current symptoms include: fatigue She feels she is Improved since last visit.     08/31/2022    9:45 AM 05/25/2022   11:19 AM 09/15/2021     2:07 PM  Depression screen PHQ 2/9  Decreased Interest 0 0 0  Down, Depressed, Hopeless 0 0 0  PHQ - 2 Score 0 0 0  Altered sleeping 0 0 3  Tired, decreased energy 1 1 0  Change in appetite 0 0 0  Feeling bad or failure about yourself  0 0 0  Trouble concentrating 1 1 0  Moving slowly or fidgety/restless 0 0 0  Suicidal thoughts 0 0 0  PHQ-9 Score _0 Difficult doing work/chores Not difficult at all Not difficult at all Not difficult at all     Current Outpatient Medications on File Prior to Visit  Medication Sig Dispense Refill   albuterol (PROAIR HFA) 108 (90 Base) MCG/ACT inhaler Inhale 2 puffs into the lungs every 4 (four) hours as needed for wheezing or shortness of breath. 1 each 2   buPROPion (WELLBUTRIN SR) 150 MG 12 hr tablet Take 1 tablet (150 mg total) by mouth daily. 90 tablet 1   buPROPion (WELLBUTRIN XL) 300 MG 24 hr tablet Take 1 tablet (300 mg total) by mouth daily. 90 tablet 1   carvedilol (COREG) 25 MG tablet TAKE 1 TABLET BY MOUTH TWICE A DAY WITH MEALS 60 tablet 4   cetirizine (ZYRTEC) 10 MG tablet Take 10 mg by mouth daily.     chlorthalidone (HYGROTON) 25 MG tablet Take 1 tablet (25 mg total) by mouth daily. 90 tablet  3   cloNIDine (CATAPRES - DOSED IN MG/24 HR) 0.2 mg/24hr patch Place 1 patch (0.2 mg total) onto the skin once a week. 4 patch 12   EPINEPHrine 0.3 mg/0.3 mL IJ SOAJ injection Inject 0.3 mg into the muscle as needed for anaphylaxis. 1 each 1   famotidine (PEPCID) 20 MG tablet TAKE 1 TABLET BY MOUTH TWICE A DAY (Patient taking differently: Take 20 mg by mouth daily.) 60 tablet 2   fluticasone (FLONASE) 50 MCG/ACT nasal spray Place 2 sprays into both nostrils daily. 16 g 0   hydrALAZINE (APRESOLINE) 50 MG tablet Take 1 tablet (50 mg total) by mouth 3 (three) times daily. 270 tablet 3   KLOR-CON M10 10 MEQ tablet TAKE 1 TABLET BY MOUTH TWICE A DAY 60 tablet 3   triamcinolone cream (KENALOG) 0.1 % APPLY 1 APPLICATION. TOPICALLY 2 (TWO) TIMES DAILY. 30  g 0   Vitamin D, Ergocalciferol, (DRISDOL) 1.25 MG (50000 UNIT) CAPS capsule TAKE 1 CAPSULE (50,000 UNITS TOTAL) BY MOUTH EVERY 7 (SEVEN) DAYS 4 capsule 2   zolpidem (AMBIEN) 10 MG tablet TAKE 1 TABLET BY MOUTH EVERYDAY AT BEDTIME 30 tablet 1   No current facility-administered medications on file prior to visit.   Past Medical History:  Diagnosis Date   Anemia    Pernicious   Angio-edema    Hypertension    Recurrent upper respiratory infection (URI)    Urticaria    Past Surgical History:  Procedure Laterality Date   ADENOIDECTOMY     MYOMECTOMY     Uterine Fibroids   SINOSCOPY     SINUS EXPLORATION     TONSILLECTOMY      Family History  Problem Relation Age of Onset   Stroke Mother    Hypertension Mother    Diabetes Mother        type 1   Heart disease Mother    Hypertension Father    Diabetes Father        Type 2   Cancer Maternal Grandmother        Ovarian   Heart disease Maternal Grandmother    Hypertension Maternal Grandmother    Diabetes Maternal Grandmother        Type 1   Kidney disease Maternal Grandmother    Diabetes Maternal Grandfather        Type 2   Other Maternal Grandfather        ITP   Hypotension Maternal Grandfather    Diabetes Paternal Grandmother        Type 2   Hypertension Paternal Grandmother    Cancer Paternal Grandfather        Bladder   Hypertension Paternal Grandfather    Social History   Socioeconomic History   Marital status: Married    Spouse name: Gus Designer, industrial/product   Number of children: Not on file   Years of education: Not on file   Highest education level: Not on file  Occupational History   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  Tobacco Use   Smoking status: Every Day    Packs/day: 0.25    Years: 20.00    Total pack years: 5.00    Types: Cigarettes    Passive exposure: Current   Smokeless tobacco: Never  Vaping Use   Vaping Use: Never used  Substance and Sexual Activity   Alcohol use: No    Drug use: No   Sexual activity: Yes    Partners: Male  Birth control/protection: None  Other Topics Concern   Not on file  Social History Narrative   Not on file   Social Determinants of Health   Financial Resource Strain: Low Risk  (09/15/2021)   Overall Financial Resource Strain (CARDIA)    Difficulty of Paying Living Expenses: Not hard at all  Food Insecurity: No Food Insecurity (09/15/2021)   Hunger Vital Sign    Worried About Running Out of Food in the Last Year: Never true    Ran Out of Food in the Last Year: Never true  Transportation Needs: No Transportation Needs (09/15/2021)   PRAPARE - Hydrologist (Medical): No    Lack of Transportation (Non-Medical): No  Physical Activity: Insufficiently Active (09/15/2021)   Exercise Vital Sign    Days of Exercise per Week: 2 days    Minutes of Exercise per Session: 20 min  Stress: No Stress Concern Present (09/15/2021)   [place]    Feeling of Stress : Not at all  Social Connections: Moderately Isolated (09/15/2021)   Social Connection and Isolation Panel [NHANES]    Frequency of Communication with Friends and Family: More than three times a week    Frequency of Social Gatherings with Friends and Family: Three times a week    Attends Religious Services: Never    Active Member of Clubs or Organizations: No    Attends Archivist Meetings: Never    Marital Status: Married    Review of Systems  Constitutional:  Negative for chills, fatigue and fever.  HENT:  Negative for congestion, ear pain, rhinorrhea and sore throat.   Respiratory:  Negative for cough and shortness of breath.   Cardiovascular:  Negative for chest pain.  Gastrointestinal:  Negative for abdominal pain, constipation, diarrhea, nausea and vomiting.  Genitourinary:  Negative for dysuria and urgency.  Musculoskeletal:  Negative for back pain and  myalgias.  Neurological:  Negative for dizziness, weakness, light-headedness and headaches.  Psychiatric/Behavioral:  Negative for dysphoric mood. The patient is not nervous/anxious.      Objective:  BP (!) 142/104   Pulse 68   Temp (!) 97.2 F (36.2 C)   Ht _0  (1.702 m)   Wt 268 lb (121.6 kg)   SpO2 98%   BMI 41.97 kg/m       08/31/2022    9:42 AM 08/17/2022    2:45 PM 07/01/2022   11:42 AM  BP/Weight  Systolic BP  828 003  Diastolic BP  88 98  Wt. (Lbs) 268    BMI 41.97 kg/m2      Physical Exam Vitals reviewed.  Constitutional:      Appearance: Normal appearance.  HENT:     Right Ear: Tympanic membrane normal.     Left Ear: Tympanic membrane normal.     Nose: Nose normal.     Mouth/Throat:     Mouth: Mucous membranes are moist.  Eyes:     Pupils: Pupils are equal, round, and reactive to light.  Cardiovascular:     Rate and Rhythm: Normal rate and regular rhythm.  Pulmonary:     Effort: Pulmonary effort is normal.     Breath sounds: Normal breath sounds.  Abdominal:     General: Bowel sounds are normal.     Palpations: Abdomen is soft.  Musculoskeletal:        General: Normal range of motion.  Skin:    General: Skin is warm  and dry.     Capillary Refill: Capillary refill takes less than 2 seconds.  Neurological:     General: No focal deficit present.     Mental Status: She is alert and oriented to person, place, and time.  Psychiatric:        Mood and Affect: Mood normal.        Behavior: Behavior normal.     Lab Results  Component Value Date   WBC 5.7 04/26/2022   HGB 12.9 04/26/2022   HCT 41.0 04/26/2022   PLT 288 04/26/2022   GLUCOSE 86 04/26/2022   CHOL 221 (H) 04/26/2022   TRIG 75 04/26/2022   HDL 76 04/26/2022   LDLCALC 132 (H) 04/26/2022   ALT 39 (H) 04/26/2022   AST 27 04/26/2022   NA 141 04/26/2022   K 3.6 04/26/2022   CL 101 04/26/2022   CREATININE 1.08 (H) 04/26/2022   BUN 11 04/26/2022   CO2 23 04/26/2022   TSH 2.330  02/25/2022   HGBA1C 5.6 01/13/2022      Assessment & Plan:   1. Essential hypertension-not at goal - CBC with Differential/Platelet - Comprehensive metabolic panel - Lipid panel  2. Gastroesophageal reflux disease, unspecified whether esophagitis present-well controlled - CBC with Differential/Platelet - Comprehensive metabolic panel  3. Moderate episode of recurrent major depressive disorder (HCC)-well controlled - Comprehensive metabolic panel -CBC -Continue Wellbutrin 450 mg QD  4. Need for immunization against influenza - Flu Vaccine QUAD 75moIM (Fluarix, Fluzone & Alfiuria Quad PF)  5. Cigarette nicotine dependence with nicotine-induced disorder - CBC with Differential/Platelet - Comprehensive metabolic panel -recommend cessation  6. Class 3 severe obesity due to excess calories with serious comorbidity and body mass index (BMI) of 45.0 to 49.9 in adult (HCC) - CBC with Differential/Platelet - Comprehensive metabolic panel - Lipid panel   Continue medications as prescribed We will call you with lab results Follow-up with hypertensive clinic as scheduled Follow-up in 338-month fasting     Follow-up: 3-15-monthfasting  An After Visit Summary was printed and given to the patient.  I, ShaRip HarbourP, have reviewed all documentation for this visit. The documentation on 08/31/22 for the exam, diagnosis, procedures, and orders are all accurate and complete.    Signed, ShaRip HarbourP [phone]

## 2022-08-31 ENCOUNTER — Ambulatory Visit: Payer: BC Managed Care – PPO | Attending: Cardiology

## 2022-08-31 ENCOUNTER — Encounter: Payer: Self-pay | Admitting: Nurse Practitioner

## 2022-08-31 ENCOUNTER — Ambulatory Visit (INDEPENDENT_AMBULATORY_CARE_PROVIDER_SITE_OTHER): Payer: BC Managed Care – PPO | Admitting: Nurse Practitioner

## 2022-08-31 VITALS — BP 142/104 | HR 68 | Temp 97.2°F | Ht 67.0 in | Wt 268.0 lb

## 2022-08-31 DIAGNOSIS — F331 Major depressive disorder, recurrent, moderate: Secondary | ICD-10-CM

## 2022-08-31 DIAGNOSIS — I10 Essential (primary) hypertension: Secondary | ICD-10-CM | POA: Diagnosis not present

## 2022-08-31 DIAGNOSIS — E66813 Obesity, class 3: Secondary | ICD-10-CM

## 2022-08-31 DIAGNOSIS — K219 Gastro-esophageal reflux disease without esophagitis: Secondary | ICD-10-CM | POA: Diagnosis not present

## 2022-08-31 DIAGNOSIS — Z0000 Encounter for general adult medical examination without abnormal findings: Secondary | ICD-10-CM

## 2022-08-31 DIAGNOSIS — Z6841 Body Mass Index (BMI) 40.0 and over, adult: Secondary | ICD-10-CM

## 2022-08-31 DIAGNOSIS — Z23 Encounter for immunization: Secondary | ICD-10-CM

## 2022-08-31 DIAGNOSIS — F17219 Nicotine dependence, cigarettes, with unspecified nicotine-induced disorders: Secondary | ICD-10-CM

## 2022-08-31 DIAGNOSIS — J301 Allergic rhinitis due to pollen: Secondary | ICD-10-CM

## 2022-08-31 DIAGNOSIS — E559 Vitamin D deficiency, unspecified: Secondary | ICD-10-CM

## 2022-08-31 NOTE — Patient Instructions (Addendum)
DASH Eating Plan DASH stands for Dietary Approaches to Stop Hypertension. The DASH eating plan is a healthy eating plan that has been shown to: Reduce high blood pressure (hypertension). Reduce your risk for type 2 diabetes, heart disease, and stroke. Help with weight loss. What are tips for following this plan? Reading food labels Check food labels for the amount of salt (sodium) per serving. Choose foods with less than 5 percent of the Daily Value of sodium. Generally, foods with less than 300 milligrams (mg) of sodium per serving fit into this eating plan. To find whole grains, look for the word "whole" as the first word in the ingredient list. Shopping Buy products labeled as "low-sodium" or "no salt added." Buy fresh foods. Avoid canned foods and pre-made or frozen meals. Cooking Avoid adding salt when cooking. Use salt-free seasonings or herbs instead of table salt or sea salt. Check with your health care provider or pharmacist before using salt substitutes. Do not fry foods. Cook foods using healthy methods such as baking, boiling, grilling, roasting, and broiling instead. Cook with heart-healthy oils, such as olive, canola, avocado, soybean, or sunflower oil. Meal planning  Eat a balanced diet that includes: 4 or more servings of fruits and 4 or more servings of vegetables each day. Try to fill one-half of your plate with fruits and vegetables. 6-8 servings of whole grains each day. Less than 6 oz (170 g) of lean meat, poultry, or fish each day. A 3-oz (85-g) serving of meat is about the same size as a deck of cards. One egg equals 1 oz (28 g). 2-3 servings of low-fat dairy each day. One serving is 1 cup (237 mL). 1 serving of nuts, seeds, or beans 5 times each week. 2-3 servings of heart-healthy fats. Healthy fats called omega-3 fatty acids are found in foods such as walnuts, flaxseeds, fortified milks, and eggs. These fats are also found in cold-water fish, such as sardines,  salmon, and mackerel. Limit how much you eat of: Canned or prepackaged foods. Food that is high in trans fat, such as some fried foods. Food that is high in saturated fat, such as fatty meat. Desserts and other sweets, sugary drinks, and other foods with added sugar. Full-fat dairy products. Do not salt foods before eating. Do not eat more than 4 egg yolks a week. Try to eat at least 2 vegetarian meals a week. Eat more home-cooked food and less restaurant, buffet, and fast food. Lifestyle When eating at a restaurant, ask that your food be prepared with less salt or no salt, if possible. If you drink alcohol: Limit how much you use to: 0-1 drink a day for women who are not pregnant. 0-2 drinks a day for men. Be aware of how much alcohol is in your drink. In the [place] one drink equals one 12 oz bottle of beer (355 mL), one 5 oz glass of wine (148 mL), or one 1 oz glass of hard liquor (44 mL). General information Avoid eating more than 2,300 mg of salt a day. If you have hypertension, you may need to reduce your sodium intake to 1,500 mg a day. Work with your health care provider to maintain a healthy body weight or to lose weight. Ask what an ideal weight is for you. Get at least 30 minutes of exercise that causes your heart to beat faster (aerobic exercise) most days of the week. Activities may include walking, swimming, or biking. Work with your health care provider or dietitian  to adjust your eating plan to your individual calorie needs. What foods should I eat? Fruits All fresh, dried, or frozen fruit. Canned fruit in natural juice (without added sugar). Vegetables Fresh or frozen vegetables (raw, steamed, roasted, or grilled). Low-sodium or reduced-sodium tomato and vegetable juice. Low-sodium or reduced-sodium tomato sauce and tomato paste. Low-sodium or reduced-sodium canned vegetables. Grains Whole-grain or whole-wheat bread. Whole-grain or whole-wheat pasta. Brown rice. [place]. Bulgur. Whole-grain and low-sodium cereals. Pita bread. Low-fat, low-sodium crackers. Whole-wheat flour tortillas. Meats and other proteins Skinless chicken or [place]. Ground chicken or [place]. Pork with fat trimmed off. Fish and seafood. Egg whites. Dried beans, peas, or lentils. Unsalted nuts, nut butters, and seeds. Unsalted canned beans. Lean cuts of beef with fat trimmed off. Low-sodium, lean precooked or cured meat, such as sausages or meat loaves. Dairy Low-fat (1%) or fat-free (skim) milk. Reduced-fat, low-fat, or fat-free cheeses. Nonfat, low-sodium ricotta or cottage cheese. Low-fat or nonfat yogurt. Low-fat, low-sodium cheese. Fats and oils Soft margarine without trans fats. Vegetable oil. Reduced-fat, low-fat, or light mayonnaise and salad dressings (reduced-sodium). Canola, safflower, olive, avocado, soybean, and sunflower oils. Avocado. Seasonings and condiments Herbs. Spices. Seasoning mixes without salt. Other foods Unsalted popcorn and pretzels. Fat-free sweets. The items listed above may not be a complete list of foods and beverages you can eat. Contact a dietitian for more information. What foods should I avoid? Fruits Canned fruit in a light or heavy syrup. Fried fruit. Fruit in cream or butter sauce. Vegetables Creamed or fried vegetables. Vegetables in a cheese sauce. Regular canned vegetables (not low-sodium or reduced-sodium). Regular canned tomato sauce and paste (not low-sodium or reduced-sodium). Regular tomato and vegetable juice (not low-sodium or reduced-sodium). Angie Fava. Olives. Grains Baked goods made with fat, such as croissants, muffins, or some breads. Dry pasta or rice meal packs. Meats and other proteins Fatty cuts of meat. Ribs. Fried meat. Berniece Salines. Bologna, salami, and other precooked or cured meats, such as sausages or meat loaves. Fat from the back of a pig (fatback). Bratwurst. Salted nuts and seeds. Canned beans with added salt. Canned or smoked  fish. Whole eggs or egg yolks. Chicken or [place] with skin. Dairy Whole or 2% milk, cream, and half-and-half. Whole or full-fat cream cheese. Whole-fat or sweetened yogurt. Full-fat cheese. Nondairy creamers. Whipped toppings. Processed cheese and cheese spreads. Fats and oils Butter. Stick margarine. Lard. Shortening. Ghee. Bacon fat. Tropical oils, such as coconut, palm kernel, or palm oil. Seasonings and condiments Onion salt, garlic salt, seasoned salt, table salt, and sea salt. Worcestershire sauce. Tartar sauce. Barbecue sauce. Teriyaki sauce. Soy sauce, including reduced-sodium. Steak sauce. Canned and packaged gravies. Fish sauce. Oyster sauce. Cocktail sauce. Store-bought horseradish. Ketchup. Mustard. Meat flavorings and tenderizers. Bouillon cubes. Hot sauces. Pre-made or packaged marinades. Pre-made or packaged taco seasonings. Relishes. Regular salad dressings. Other foods Salted popcorn and pretzels. The items listed above may not be a complete list of foods and beverages you should avoid. Contact a dietitian for more information. Where to find more information National Heart, Lung, and Blood Institute: https://wilson-eaton.com/ American Heart Association: www.heart.org Academy of Nutrition and Dietetics: www.eatright.[place]: www.kidney.org Summary The DASH eating plan is a healthy eating plan that has been shown to reduce high blood pressure (hypertension). It may also reduce your risk for type 2 diabetes, heart disease, and stroke. When on the DASH eating plan, aim to eat more fresh fruits and vegetables, whole grains, lean proteins, low-fat dairy, and heart-healthy fats. With the  DASH eating plan, you should limit salt (sodium) intake to 2,300 mg a day. If you have hypertension, you may need to reduce your sodium intake to 1,500 mg a day. Work with your health care provider or dietitian to adjust your eating plan to your individual calorie needs. This information  is not intended to replace advice given to you by your health care provider. Make sure you discuss any questions you have with your health care provider. Document Revised: 08/24/2019 Document Reviewed: 08/24/2019 Elsevier Patient Education  [place] Your Hypertension Hypertension, also called high blood pressure, is when the force of the blood pressing against the walls of the arteries is too strong. Arteries are blood vessels that carry blood from your heart throughout your body. Hypertension forces the heart to work harder to pump blood and may cause the arteries to become narrow or stiff. Understanding blood pressure readings A blood pressure reading includes a higher number over a lower number: The first, or top, number is called the systolic pressure. It is a measure of the pressure in your arteries as your heart beats. The second, or bottom number, is called the diastolic pressure. It is a measure of the pressure in your arteries as the heart relaxes. For most people, a normal blood pressure is below 120/80. Your personal target blood pressure may vary depending on your medical conditions, your age, and other factors. Blood pressure is classified into four stages. Based on your blood pressure reading, your health care provider may use the following stages to determine what type of treatment you need, if any. Systolic pressure and diastolic pressure are measured in a unit called millimeters of mercury (mmHg). Normal Systolic pressure: below 416. Diastolic pressure: below 80. Elevated Systolic pressure: 384-536. Diastolic pressure: below 80. Hypertension stage 1 Systolic pressure: 468-032. Diastolic pressure: 12-24. Hypertension stage 2 Systolic pressure: 825 or above. Diastolic pressure: 90 or above. How can this condition affect me? Managing your hypertension is very important. Over time, hypertension can damage the arteries and decrease blood flow to parts of the  body, including the brain, heart, and kidneys. Having untreated or uncontrolled hypertension can lead to: A heart attack. A stroke. A weakened blood vessel (aneurysm). Heart failure. Kidney damage. Eye damage. Memory and concentration problems. Vascular dementia. What actions can I take to manage this condition? Hypertension can be managed by making lifestyle changes and possibly by taking medicines. Your health care provider will help you make a plan to bring your blood pressure within a normal range. You may be referred for counseling on a healthy diet and physical activity. Nutrition  Eat a diet that is high in fiber and potassium, and low in salt (sodium), added sugar, and fat. An example eating plan is called the DASH diet. DASH stands for Dietary Approaches to Stop Hypertension. To eat this way: Eat plenty of fresh fruits and vegetables. Try to fill one-half of your plate at each meal with fruits and vegetables. Eat whole grains, such as whole-wheat pasta, brown rice, or whole-grain bread. Fill about one-fourth of your plate with whole grains. Eat low-fat dairy products. Avoid fatty cuts of meat, processed or cured meats, and poultry with skin. Fill about one-fourth of your plate with lean proteins such as fish, chicken without skin, beans, eggs, and tofu. Avoid pre-made and processed foods. These tend to be higher in sodium, added sugar, and fat. Reduce your daily sodium intake. Many people with hypertension should eat less than 1,500 mg of  sodium a day. Lifestyle  Work with your health care provider to maintain a healthy body weight or to lose weight. Ask what an ideal weight is for you. Get at least 30 minutes of exercise that causes your heart to beat faster (aerobic exercise) most days of the week. Activities may include walking, swimming, or biking. Include exercise to strengthen your muscles (resistance exercise), such as weight lifting, as part of your weekly exercise routine.  Try to do these types of exercises for 30 minutes at least 3 days a week. Do not use any products that contain nicotine or tobacco. These products include cigarettes, chewing tobacco, and vaping devices, such as e-cigarettes. If you need help quitting, ask your health care provider. Control any long-term (chronic) conditions you have, such as high cholesterol or diabetes. Identify your sources of stress and find ways to manage stress. This may include meditation, deep breathing, or making time for fun activities. Alcohol use Do not drink alcohol if: Your health care provider tells you not to drink. You are pregnant, may be pregnant, or are planning to become pregnant. If you drink alcohol: Limit how much you have to: 0-1 drink a day for women. 0-2 drinks a day for men. Know how much alcohol is in your drink. In the [place] one drink equals one 12 oz bottle of beer (355 mL), one 5 oz glass of wine (148 mL), or one 1 oz glass of hard liquor (44 mL). Medicines Your health care provider may prescribe medicine if lifestyle changes are not enough to get your blood pressure under control and if: Your systolic blood pressure is 130 or higher. Your diastolic blood pressure is 80 or higher. Take medicines only as told by your health care provider. Follow the directions carefully. Blood pressure medicines must be taken as told by your health care provider. The medicine does not work as well when you skip doses. Skipping doses also puts you at risk for problems. Monitoring Before you monitor your blood pressure: Do not smoke, drink caffeinated beverages, or exercise within 30 minutes before taking a measurement. Use the bathroom and empty your bladder (urinate). Sit quietly for at least 5 minutes before taking measurements. Monitor your blood pressure at home as told by your health care provider. To do this: Sit with your back straight and supported. Place your feet flat on the floor. Do not cross your  legs. Support your arm on a flat surface, such as a table. Make sure your upper arm is at heart level. Each time you measure, take two or three readings one minute apart and record the results. You may also need to have your blood pressure checked regularly by your health care provider. General information Talk with your health care provider about your diet, exercise habits, and other lifestyle factors that may be contributing to hypertension. Review all the medicines you take with your health care provider because there may be side effects or interactions. Keep all follow-up visits. Your health care provider can help you create and adjust your plan for managing your high blood pressure. Where to find more information National Heart, Lung, and Blood Institute: https://wilson-eaton.com/ American Heart Association: www.heart.org Contact a health care provider if: You think you are having a reaction to medicines you have taken. You have repeated (recurrent) headaches. You feel dizzy. You have swelling in your ankles. You have trouble with your vision. Get help right away if: You develop a severe headache or confusion. You have unusual weakness or  numbness, or you feel faint. You have severe pain in your chest or abdomen. You vomit repeatedly. You have trouble breathing. These symptoms may be an emergency. Get help right away. Call 911. Do not wait to see if the symptoms will go away. Do not drive yourself to the hospital. Summary Hypertension is when the force of blood pumping through your arteries is too strong. If this condition is not controlled, it may put you at risk for serious complications. Your personal target blood pressure may vary depending on your medical conditions, your age, and other factors. For most people, a normal blood pressure is less than 120/80. Hypertension is managed by lifestyle changes, medicines, or both. Lifestyle changes to help manage hypertension include losing  weight, eating a healthy, low-sodium diet, exercising more, stopping smoking, and limiting alcohol. This information is not intended to replace advice given to you by your health care provider. Make sure you discuss any questions you have with your health care provider. Document Revised: 06/04/2021 Document Reviewed: 06/04/2021 Elsevier Patient Education  [place].

## 2022-08-31 NOTE — Progress Notes (Signed)
Appointment Outcome:  Completed, Session #: 1                        Start time: 1:57pm   End time: 2:35pm   Total Mins: 38 minutes  AGREEMENTS SECTION   Overall Goal(s): Smoking cessation Increase physical activity Improve healthy eating habits                                         Agreement/Action Steps Smoking Cessation Practice mini quits Track time in between cigarettes Cut back to 4 cigarettes/day Take Wellbutrin as prescribed   Increase Physical Activity Exercise 3xs/week for 30 minutes between 8-9am Organize workouts around event days Research Beach Body and Yoga videos on YouTube   Improving Health Eating Habits Practice portion control Use containers Read food labels Aim for 1500mg of sodium/day Meal planning Incorporate protein shakes with fruits and vegetables   Progress Notes:  Patient stated that she has only smoked 1 cigarette so far today. Patient reported that she smoked her first cigarette around 2pm yesterday. Patient mentioned that she doesn't wake up needing to smoke. Patient contributes her progress to the reduction in her stress level from a 10/10 to a 5/10 and the alter in the taste of the cigarette from taking Wellbutrin as prescribed.   Patient stated that her husband is supporting her in her efforts to quit smoking. Patient stated that she has become more conscious of how her behaviors such as smoking impacts her blood pressure. Patient practices mini quits when working and when around others. Patient stated that she doesn't smoke during those times and when she is busy, she doesn't think about smoking.   Patient reported that she researched yoga videos on YouTube and think that these exercises would be a great place to start creating a workout routine. Patient stated that although she did not exercise at home 3 days/week for 30 minutes, she was able to walk an average of 13 miles in 7 days (would have been more recorded due to forgetting to wear  watch one day).   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 Patient stated that although they ate pizza one day for lunch, she ensured that they ate a salad as well to aid in vegetable consumption. Patient shared that when eating out, she also aims to choose the healthier options on the menu, such as a small chili and a baked potato from [employer]. Patient stated that she chose that meal over having pizza again that night.   Patient stated that she is has been able to prioritize herself over the past two weeks. Patient mentioned that she didn't realize how much time she invested in others and work. Patient expressed that this has increased her self-awareness and motivation to make the necessary changes to achieve her health goals. 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 Patient shared that because of change, she was also able to incorporate more sleep.   Patient expressed that she is excited about the progress that she has made over the past two weeks and is ready to focus on her goals.    Indicators   of Success and Accountability:  Patient stated that being able to eat during workdays and cutting back on smoking are her indicators of success and accountability.  Readiness: Patient stated that her readiness level is a 4/5 because she is ready to focus and is in the action stage of smoking cessation, increasing physical activity, and improving healthy eating habits.   Strengths and Supports: Patient is being supported by her husband. Patient is relying on her increase in self-awareness of her health behaviors. Challenges and Barriers: Patient stated that not having a consistent work schedule can be challenging in implementing all her action steps  consistently.   Coaching Outcomes: Patient wants to focus more on meal planning and establishing a consistent workout routine over the next two weeks.   Patient plans to go grocery shopping and will begin to read food labels to monitor sodium intake. Patient will determine how much sodium she typically consumes and will work to cut back to 1500 mg of sodium per day.   Patient mentioned that she will purchase more containers to aid in practicing portion control.   Patient will use the beginner yoga videos that she researched to start her workout routine. Patient stated that she will keep her original planned workout schedule of 3 days/week for 30 minutes. Patient stated that she will be flexible with her workout time of 8-9am if an appointment or event interferes with her ability to exercise.   No changes were made to the patient's action steps for the next two weeks and will be implemented as outlined above.    Attempted: Fulfilled - Patient has been practicing mini quits and have reduced her smoking to 4 cigarettes per day. Patient is taking Wellbutrin as prescribed. Patient has exercised 3xs/week over the past two weeks by walking. Patient has researched yoga videos on YouTube.  Partial - Patient has practiced portion control but has not used the containers.  Not met - Patient has not begun reading food labels to aim for 1500 mg of sodium per day. Patient has not begun meal planning and incorporating protein shakes with fruit/vegetables.       

## 2022-09-01 LAB — COMPREHENSIVE METABOLIC PANEL
ALT: 40 IU/L — ABNORMAL HIGH (ref 0–32)
AST: 28 IU/L (ref 0–40)
Albumin/Globulin Ratio: 2.4 — ABNORMAL HIGH (ref 1.2–2.2)
Albumin: 4.5 g/dL (ref 3.9–4.9)
Alkaline Phosphatase: 58 IU/L (ref 44–121)
BUN/Creatinine Ratio: 12 (ref 9–23)
BUN: 14 mg/dL (ref 6–24)
Bilirubin Total: 0.4 mg/dL (ref 0.0–1.2)
CO2: 26 mmol/L (ref 20–29)
Calcium: 9.7 mg/dL (ref 8.7–10.2)
Chloride: 98 mmol/L (ref 96–106)
Creatinine, Ser: 1.15 mg/dL — ABNORMAL HIGH (ref 0.57–1.00)
Globulin, Total: 1.9 g/dL (ref 1.5–4.5)
Glucose: 95 mg/dL (ref 70–99)
Potassium: 3.3 mmol/L — ABNORMAL LOW (ref 3.5–5.2)
Sodium: 140 mmol/L (ref 134–144)
Total Protein: 6.4 g/dL (ref 6.0–8.5)
eGFR: 60 mL/min/{1.73_m2} (ref >59)

## 2022-09-01 LAB — CBC WITH DIFFERENTIAL/PLATELET
Basophils Absolute: 0.1 10*3/uL (ref 0.0–0.2)
Basos: 1 %
EOS (ABSOLUTE): 0.2 10*3/uL (ref 0.0–0.4)
Eos: 3 %
Hematocrit: 40.5 % (ref 34.0–46.6)
Hemoglobin: 13.1 g/dL (ref 11.1–15.9)
Immature Grans (Abs): 0 10*3/uL (ref 0.0–0.1)
Immature Granulocytes: 0 %
Lymphocytes Absolute: 1.9 10*3/uL (ref 0.7–3.1)
Lymphs: 32 %
MCH: 25.5 pg — ABNORMAL LOW (ref 26.6–33.0)
MCHC: 32.3 g/dL (ref 31.5–35.7)
MCV: 79 fL (ref 79–97)
Monocytes Absolute: 0.6 10*3/uL (ref 0.1–0.9)
Monocytes: 10 %
Neutrophils Absolute: 3.3 10*3/uL (ref 1.4–7.0)
Neutrophils: 54 %
Platelets: 277 10*3/uL (ref 150–450)
RBC: 5.13 x10E6/uL (ref 3.77–5.28)
RDW: 15.8 % — ABNORMAL HIGH (ref 11.7–15.4)
WBC: 6 10*3/uL (ref 3.4–10.8)

## 2022-09-01 LAB — LIPID PANEL
Chol/HDL Ratio: 2.8 ratio (ref 0.0–4.4)
Cholesterol, Total: 168 mg/dL (ref 100–199)
HDL: 60 mg/dL (ref >39)
LDL Chol Calc (NIH): 93 mg/dL (ref 0–99)
Triglycerides: 80 mg/dL (ref 0–149)
VLDL Cholesterol Cal: 15 mg/dL (ref 5–40)

## 2022-09-01 LAB — CARDIOVASCULAR RISK ASSESSMENT

## 2022-09-02 DIAGNOSIS — G4733 Obstructive sleep apnea (adult) (pediatric): Secondary | ICD-10-CM | POA: Diagnosis not present

## 2022-09-03 ENCOUNTER — Other Ambulatory Visit: Payer: Self-pay

## 2022-09-03 DIAGNOSIS — E876 Hypokalemia: Secondary | ICD-10-CM

## 2022-09-06 ENCOUNTER — Telehealth: Payer: Self-pay | Admitting: Pharmacist Clinician (PhC)/ Clinical Pharmacy Specialist

## 2022-09-06 NOTE — Telephone Encounter (Signed)
[facility] BP readings are sill rather elevated, diastolic readings all > 616.    LMOM for patient to return call.  May need to increase clonidine patch or hydralazine; at last visit was c/o drowsiness with these meds

## 2022-09-07 MED ORDER — CLONIDINE 0.3 MG/24HR TD PTWK: 0.3000 mg | MEDICATED_PATCH | TRANSDERMAL | 12 refills | Status: DC

## 2022-09-07 NOTE — Telephone Encounter (Signed)
Spoke with patient.  She notes that she feels better after switching clonidine to patches.  BP still elevated, but not as much daytime drowsiness.  Will increase patch to 0.3 mg dose when she changes out tomorrow.  Patient agreeable to plan.    She is scheduled to f/u with Dr. Oval Linsey on Jan 8.

## 2022-09-08 ENCOUNTER — Telehealth (HOSPITAL_BASED_OUTPATIENT_CLINIC_OR_DEPARTMENT_OTHER): Payer: Self-pay | Admitting: *Deleted

## 2022-09-08 NOTE — Telephone Encounter (Signed)
Returned call to patient who states she had higher than usual Bp last night.  Pt denied any symptoms even when specific symptoms were listed for her.  Pt states "I just don't think by Bp was that high last night."  Pt. Confirmed she switched clonidine pt to 0.3 today as directed.  Forwarding to [employer] LPN for follow up.  Georgana Curio MHA RN CCM

## 2022-09-08 NOTE — Telephone Encounter (Signed)
Pt is returning call. Requesting call back.  

## 2022-09-08 NOTE — Telephone Encounter (Signed)
[name], Amanda Barrett DOB 13-Apr-1976 indicated that she has a medication question. Her BPs are just so high regularly, and she clocked in one last night that was 227/123, that I thought it prudent to let you over at the office know!  Above message received from nurse monitoring Amanda Barrett  Reached out to Amanda Barrett who just spoken with patient 12/5   She was to increase her patch Clonidine to 0.3 mg, replacing today  Would you ask her to take an extra 50 mg of hydralazine this afternoon if her pressure is > 233 systolic or > 435 diastolic  Spoke with patient who stated she has been sick over the last few days.  She has taken Mucinex Nightime several nights Advised to only take plain Mucinex or Corcidin BP  Did review recommendations if SBP above 160 or DBP above 100 Patient verbalized understanding

## 2022-09-10 ENCOUNTER — Ambulatory Visit: Payer: BC Managed Care – PPO

## 2022-09-14 ENCOUNTER — Ambulatory Visit: Payer: BC Managed Care – PPO

## 2022-09-14 ENCOUNTER — Telehealth: Payer: Self-pay

## 2022-09-14 ENCOUNTER — Ambulatory Visit: Payer: BC Managed Care – PPO | Attending: Internal Medicine

## 2022-09-14 DIAGNOSIS — Z0000 Encounter for general adult medical examination without abnormal findings: Secondary | ICD-10-CM

## 2022-09-14 NOTE — Progress Notes (Signed)
Appointment Outcome: Completed, Session #: 2                        Start time:  2:06pm   End time: 2:42pm   Total Mins: 36 minutes  AGREEMENTS SECTION   Overall Goal(s): Smoking cessation Increase physical activity Improve healthy eating habits                                         Agreement/Action Steps Smoking Cessation Practice mini quits Track time in between cigarettes Cut back to 4 cigarettes/day Take Wellbutrin as prescribed   Increase Physical Activity Exercise 3xs/week for 30 minutes between 8-9am Organize workouts around event days [employer] videos on [place] portion control Use containers Read food labels Aim for 1571m of sodium/day Meal planning Incorporate protein shakes with fruits and vegetables   Progress Notes:  Patient stated that she did not smoke much over the past two weeks after falling sick. Patient stated that there were some days that she did not smoke and smoked less than 4 cigarettes. Patient stated that she would like more than one cigarette, but less than 4 on the days that she smoked. Patient reported that she did not smoke the entire cigarette. Patient shared that she would take a few puffs and tell herself that smoking was not worth the consequences.   Patient mentioned that checking her blood pressure twice daily has increased her self-awareness regarding her health behaviors. Patient expressed that her new way of thinking about smoking will be sustainable after she has fully recovered and possibly experience urges to smoke. Patient stated that these two past weeks enabled her to extend her mini quits. Patient reported that she continues to take Wellbutrin as prescribed and believe that it is helping her.   Patient was not able to engage in a routine exercise regimen due to not feeling well. Patient did not have the typical opportunities to increase her physical activity through  walking as she previously has due to not having any scheduled events recently. Patient shared that she has an upcoming event for the next two weekends that will help her get back into walking and moving more.   Patient expressed that she did not eat much during the past two weeks since she wasn't feeling well. Patient did not meal plan since she wasn't preparing food or eating as much. Patient stated that she made a dump soup last night and felt as though it may not have been the best option in sodium. Patient realized that she did manage her sodium intake by practicing portion control and choosing low sodium can goods by reading food labels when grocery shopping.   Patient has not been able to purchase additional containers to practice portion control but continues to use the ones that she previously purchased. Patient shared that she incorporates Premier protein shakes in her diet 2-3 times per week as meal replacements. Patient mentioned that she is incorporating more fresh fruit and vegetables now when grocery shopping to include with meals instead of in homemade protein shakes.    Indicators of Success and Accountability:  Patient was able to reduce her smoking to less than 4 cigarettes per day. Readiness: Patient is in the action stage of smoking cessation, increasing physical activity, and improving healthy eating habits. Strengths and Supports:  Patient is being supported by her husband. Patient relies on her increase in self-awareness to make better health choices. Challenges and Barriers: Patient does not foresee any challenges/barriers moving forward over the next two weeks.   Coaching Outcomes: Patient shared that she wanted to focus on maintaining her progress made with cutting back on smoking less than 4 cigarettes per day over the next two weeks.   Patient expressed that she wants to work on improving her diet by incorporating more protein and tracking all the food groups. Patient will  determine the amount of protein she will consume using an app that she has used previously.   Attempted: Fulfilled - Patient practiced mini quits, smoked less than 4 cigarettes per day, and continued to take Wellbutrin as prescribed. Patient practiced portion control and read food labels to monitor sodium intake.   Not met - Patient was not able to engage in an exercise routine due to being sick over the past two weeks. Patient did not incorporate meal planning because she didn't prepare meals often due to not having an appetite while sick.

## 2022-09-14 NOTE — Telephone Encounter (Signed)
Called to determine if patient was in route to health coaching appointment or needed to reschedule. Left message for patient to return call.    Taegan Haider [place] [facility] Guide, Health Coach [address], [address] [place] [address] Telephone: [phone] Email: [email or web address]

## 2022-09-22 ENCOUNTER — Telehealth (HOSPITAL_BASED_OUTPATIENT_CLINIC_OR_DEPARTMENT_OTHER): Payer: Self-pay

## 2022-09-22 NOTE — Telephone Encounter (Addendum)
Called patient, reviewed the following information with the patient. Patient is concerned that we have her mixed up with another patient. Logged onto [employer] and confirmed that she does has not have that pressure.   Consulted with Laurann Montana, NP those pressures are an average of the last week or so. NP unaware of any mix ups with another patient, advised that she restart the midday dose of Hydralazine.     ----- Message from Loel Dubonnet, NP sent at 09/22/2022 10:53 AM EST ----- Thanks for the update, Amanda Barrett!  This week her evening blood pressure around 10pm is 152/202 and her morning blood pressure around 10am is 122/76.   Present regimen includes: Clonidine 0.'3mg'$  patch Carvedilol '25mg'$  BID Chlorthalidone '25mg'$  QD Hydralazine '50mg'$  TID  Ketsia Linebaugh - will you please call to confirm what time she is taking her medications vs what time she is checking her blood pressure? I worry she may be missing her midday dose of Hydralazine.

## 2022-09-22 NOTE — Telephone Encounter (Signed)
Advised patient, verbalized understanding  

## 2022-09-29 DIAGNOSIS — G4733 Obstructive sleep apnea (adult) (pediatric): Secondary | ICD-10-CM | POA: Diagnosis not present

## 2022-10-02 DIAGNOSIS — G4733 Obstructive sleep apnea (adult) (pediatric): Secondary | ICD-10-CM | POA: Diagnosis not present

## 2022-10-05 ENCOUNTER — Ambulatory Visit: Payer: BC Managed Care – PPO

## 2022-10-05 ENCOUNTER — Telehealth (HOSPITAL_BASED_OUTPATIENT_CLINIC_OR_DEPARTMENT_OTHER): Payer: Self-pay | Admitting: *Deleted

## 2022-10-05 DIAGNOSIS — Z0000 Encounter for general adult medical examination without abnormal findings: Secondary | ICD-10-CM

## 2022-10-05 NOTE — Telephone Encounter (Signed)
-----   Message from Loel Dubonnet, NP sent at 10/05/2022  4:55 PM EST ----- Regarding: RE: Blood pressure concerns Her blood pressure is likely higher in the evening if she is not taking Hydralazine in the afternoon as prescribed.  Below is her current regimen:  Carvedilol twice daily (lasts 12 hours) Chlorthalidone '25mg'$  daily (lasts 24 hours) Clonidine 0.'3mg'$  patch - gradual release Hydralazine three times per day (lasts about 8 hours)  Would recommend she add midday Hydralazine dose as previously recommended.   She does not need a referral for primary care, she can just call and schedule. The [facility] on [facility] has a lot of good providers. Dr. Horald Pollen is also wonderful.  Daphene Jaeger or Rip Harbour - can you call her to review the plan?  TY!  ----- Message ----- From: Avelino Leeds Sent: 10/05/2022   2:56 PM EST To: Skeet Latch, MD; Earvin Hansen, LPN; # Subject: Blood pressure concerns                        Hi team,  I am health coaching this patient and during today's session she brought up a few concerns regarding her blood pressure, medication, and needing a referral to a new PCP. I included everyone on this because she first saw [facility] and will follow up with Dr. Oval Linsey on 10/11/22.   Patient's blood pressure typically is lower in the morning and higher in the evenings. Patient is wondering if it's due to her taking different medications in the am and pm?   Patient mentioned that she has discussed with someone about adding a pill midday. However, her concern is with her blood pressure being lower in the day, would her blood pressure drop more (e.g., below 124/80 or 117/72).   Patient would like a referral to a new PCP because her NP is leaving and she would like to be referred to someone what will work closely with [facility] to manage her blood pressure and medications. Would one of you be able to assist the patient with that?  Thanks, Amy

## 2022-10-05 NOTE — Telephone Encounter (Signed)
Left message to call back  

## 2022-10-05 NOTE — Progress Notes (Signed)
Appointment Outcome: Completed, Session #: 3                         Start time: 2:05pm   End time: 2:41pm   Total Mins: 36 minutes  AGREEMENTS SECTION   Overall Goal(s): Smoking cessation Increase physical activity Improve healthy eating habits                                         Agreement/Action Steps Smoking Cessation Practice mini quits Track time in between cigarettes Cut back to 4 cigarettes/day Take Wellbutrin as prescribed   Increase Physical Activity Exercise 3xs/week for 30 minutes between 8-9am Organize workouts around event days Home exercise and Yoga videos on YouTube  Improving Health Eating Habits Practice portion control Use containers Read food labels Aim for 1538m of sodium/day Meal planning Incorporate fruits and vegetables   Progress Notes:  Patient reported that the longest time frame she was able to practice a mini quit has been 48 hours. Patient stated that she was able to refrain for smoking during this time because she was busy, was not thinking about smoking, or was able to talk herself out of smoking. Patient mentioned that the hardest time to refrain from smoking is before bed when winding down. Patient shared those other times when she smoked, she was triggered by boredom or stress. Patient stated that she has gotten better with talking herself out of smoking during these times and is getting better at telling herself no when bored.   Patient reported that when she does smoke, she does not exceed a total of 4 cigarettes because she only smokes approximately 3/4 of a cigarette. Patient mentioned that when her Wellbutrin dosage was increased, that she began to see a greater difference in the reduction in her smoking and urge to smoke. Patient stated that she doesn't feel the need to smoke in the mornings as she had before. Patient shared that she has noticed how smoking contributes to the elevation of her bp, which keeps her aware of her smoking  behavior and its effects.  Patient expressed concerned about the differences in her morning and evening bp readings. Patient stated with a lower bp reading in the morning, she doesn't feel well and is concerned about adding a midday pill with the concern of it dropping her bp too low. Patient mentioned the need for a new PCP since her provider is leaving the practice. Patient is concerned about having a PCP that will work with her HCresbardproviders to manage her bp and medications together.   Patient stated that when her blood pressure is lower in the mornings, she does not have the energy to engage in physical activity. 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 Patient stated that since the peak of the wedding season has ended, she believes that she will have more control over her schedule and can establish a routine to implement exercise at home. Patient expressed that her goal for increasing physical activity was realistic and more achievable with the time she now must invest.   Patient stated that due to being extremely busy, she did forget to eat on one occasion. Patient mentioned that she has been working on making healthier  choices when she eats out at [employer] while working (e.g., choosing [employer] vs. [employer]). Patient shared that she is meal planning for the entire family, which makes it easier for her to cut out unnecessary foods and practice portion control with her family. Patient provided an example of how to takes out a portion of food items when snacking to help her practice portion control by using a container/plate instead of eating from the package the food came in.   Patient explained that she was able to read some food labels during the past two weeks, but not during the holidays for the foods that she didn't prepare. Patient shared that in those instances,  she would choose a food that was prepared healthier than other options (e.g., asparagus vs. green bean casserole). Patient feels that overall, she was able to do a better job at keeping her sodium consumption lower than before. Patient continues to incorporate raw or cooked fruit and vegetables in her diet although she has not included them in smoothies.    Indicators of Success and Accountability: Patient stated that being able to smoke less is her indicator of success and accountability.  Readiness: Patient is in the action stage of smoking cessation, increasing physical activity, and improving healthy eating habits.  Strengths and Supports: Patient is being supported by her family. Patient is relying on her determination and increase in self-awareness.  Challenges and Barriers: Patient does not foresee any challenges to implementing her action steps over the next two weeks.   Coaching Outcomes: Sent message to Dr. Oval Linsey and Laurann Montana regarding the patient's concerns of drastic difference in bp readings from am to pm, adding a midday pill and its effects, and a referral request for a new PCP since her provider is leaving.   Patient will implement the following action steps as outlined below over the next two weeks based on today's session:   Agreement/Action Steps Smoking Cessation Practice mini quits Track time in between cigarettes Extend longest time without smoking from 48 hours to 56-72 hours When triggered to smoke implement positive self-talk Engage in household chores when triggered to smoke Take Wellbutrin as prescribed   Increase Physical Activity Exercise 3xs/week for 30 minutes between 8-9am Utilize home exercise and Yoga videos on YouTube  [place] portion control Read food labels Aim for 155m of sodium/day Meal planning Incorporate fruits and vegetables   Attempted: Fulfilled - Patient was able to implement all action steps  towards smoking cessation. Patient continues to practice portion control, read food labels, and meal plan. Partial - Patient has included more fruit and vegetables into her diet without adding them to smoothies. Not met - Patient has not started working out at home 3xs/week for 30 minutes between 8-9am.

## 2022-10-06 ENCOUNTER — Telehealth (HOSPITAL_BASED_OUTPATIENT_CLINIC_OR_DEPARTMENT_OTHER): Payer: Self-pay

## 2022-10-06 ENCOUNTER — Other Ambulatory Visit: Payer: Self-pay | Admitting: Physician Assistant

## 2022-10-06 DIAGNOSIS — Z1231 Encounter for screening mammogram for malignant neoplasm of breast: Secondary | ICD-10-CM

## 2022-10-06 NOTE — Telephone Encounter (Signed)
Patient returned call and spoke with Alvina Filbert, LPN and advised of recommendations

## 2022-10-06 NOTE — Telephone Encounter (Addendum)
Left message for patient to call back    ----- Message from Loel Dubonnet, NP sent at 10/05/2022  4:55 PM EST ----- Regarding: RE: Blood pressure concerns Her blood pressure is likely higher in the evening if she is not taking Hydralazine in the afternoon as prescribed.  Below is her current regimen:  Carvedilol twice daily (lasts 12 hours) Chlorthalidone '25mg'$  daily (lasts 24 hours) Clonidine 0.'3mg'$  patch - gradual release Hydralazine three times per day (lasts about 8 hours)  Would recommend she add midday Hydralazine dose as previously recommended.   She does not need a referral for primary care, she can just call and schedule. The [place] on [facility] has a lot of good providers. Dr. Horald Pollen is also wonderful.  Daphene Jaeger or Rip Harbour - can you call her to review the plan?  TY!  ----- Message ----- From: Avelino Leeds Sent: 10/05/2022   2:56 PM EST To: Skeet Latch, MD; Earvin Hansen, LPN; # Subject: Blood pressure concerns                        Hi team,  I am health coaching this patient and during today's session she brought up a few concerns regarding her blood pressure, medication, and needing a referral to a new PCP. I included everyone on this because she first saw [facility] and will follow up with Dr. Oval Linsey on 10/11/22.   Patient's blood pressure typically is lower in the morning and higher in the evenings. Patient is wondering if it's due to her taking different medications in the am and pm?   Patient mentioned that she has discussed with someone about adding a pill midday. However, her concern is with her blood pressure being lower in the day, would her blood pressure drop more (e.g., below 124/80 or 117/72).   Patient would like a referral to a new PCP because her NP is leaving and she would like to be referred to someone what will work closely with [facility] to manage her blood pressure and medications. Would one of you be able to assist the patient  with that?  Thanks, Amy

## 2022-10-06 NOTE — Telephone Encounter (Signed)
Advised patient, verbalized understanding  

## 2022-10-11 ENCOUNTER — Ambulatory Visit (HOSPITAL_BASED_OUTPATIENT_CLINIC_OR_DEPARTMENT_OTHER): Payer: BC Managed Care – PPO | Admitting: Cardiovascular Disease

## 2022-10-11 ENCOUNTER — Encounter (HOSPITAL_BASED_OUTPATIENT_CLINIC_OR_DEPARTMENT_OTHER): Payer: Self-pay | Admitting: Cardiovascular Disease

## 2022-10-11 ENCOUNTER — Other Ambulatory Visit: Payer: Self-pay | Admitting: Cardiovascular Disease

## 2022-10-11 VITALS — BP 152/92 | HR 58 | Ht 67.0 in | Wt 267.7 lb

## 2022-10-11 DIAGNOSIS — I1A0 Resistant hypertension: Secondary | ICD-10-CM

## 2022-10-11 DIAGNOSIS — F17209 Nicotine dependence, unspecified, with unspecified nicotine-induced disorders: Secondary | ICD-10-CM

## 2022-10-11 DIAGNOSIS — Z006 Encounter for examination for normal comparison and control in clinical research program: Secondary | ICD-10-CM

## 2022-10-11 NOTE — Patient Instructions (Signed)
Medication Instructions:  Your physician recommends that you continue on your current medications as directed. Please refer to the Current Medication list given to you today.   Labwork: NONE   Testing/Procedures: NONE  Follow-Up: 11/11/2022 11:20 AM WITH CAITLIN W NP    02/14/2023 1:30 PM WITH DR [place]   Any Other Special Instructions Will Be Listed Below (If Applicable). SOMEONE WILL REACH OUT TO YOU REGARDING RENAL DENERVATION DISCUSSED TODAY   If you need a refill on your cardiac medications before your next appointment, please call your pharmacy.

## 2022-10-11 NOTE — Progress Notes (Signed)
Cardiology Office Note:    Date:  10/15/2022   ID:  Amanda Barrett, DOB 1976/02/03, MRN [id number]  PCP:  No primary care provider on file.   [facility] Cardiologist:  Skeet Latch, MD     Referring MD: Rip Harbour, NP   CC: Hypertension  History of Present Illness:   Amanda Barrett is a 47 y.o. female with a hx of hypertension, anemia, vitamin D deficiency, depression, tobacco use here for follow-up.  She first established care in [facility].  Amanda Barrett was diagnosed with hypertension when she was 47 years old. It has been previously controlled with medication. Then started ot have some gum problems with Amlodipine. This is gradually improving. Then 01/2022 developed hives including having to use epipen with ED visit 03/10/22 for angioedema. Her Nifedipine dose was increased - hives went away for a few days then as soon as Prednisone  dose completed has recurrent hives and Nifedipine discontinued. 04/2022 Clonidine initiated which took some time to adjust to. Concerned about interaction between Clonidine and Coreg with rebound hypertension.    She does take her medications 12 hours apart. She is presently taking her Hydralazine twice per day as four times per day was difficult to keep up with.  Notes about 2 hours after taking her medications begins to feel weak, tired.  No near-syncope nor syncope.  No dyspnea, chest pain, orthopnea, PND.  Lower extremity edema only if has long periods of standing which is resolved with elevation.  She was seen by Loel Dubonnet, NP on 07/01/22 and reported elevated BP readings at home.  He was started on hydralazine.  She followed up with [place], Kristen L on 08/17/2022 and she noted that the combination of hydralazine and clonidine makes her rather drowsy in the mornings for a few hours. Her HCTZ was switched to chlorthalidone 25 mg daily and switch the clonidine 0.2 mg to patches.   Recent BP in  [facility] and range from 222L to 798X systolic.   Today, she reports that her BP is improving but still not at goal.  Her BP is higher in the evenings and lower in the mornings. She checks her BP at home twice daily. She reports being more aware of when her BP is low vs high. In clinic today, her BP reading was 153/53. Upon recheck, her BP reading was 152/92. She reports lower recent HR levels. She is tolerating her medication, including Carvedilol twice daily and Clonidine patch. She tries her best to separate the  Carvedilol doses 12 hours apart. She has some difficulty keeping up with her middle dose of hydralazine. She takes morning pills later in the morning (around 9:30 AM) to avoid taking night medication too early. She exercises regularly. She easily walks 7-8 miles when she is active. She is planning on establishing a more consistent exercise routine once she figures out her schedule. She denies any palpitations, chest pain, shortness of breath, or peripheral edema. No lightheadedness, headaches, syncope, orthopnea, or PND.   Previous antihypertensives: Valsartan - allergic reaction (swelling) 01/2022 Nifedipine - allergic reaction (hives, itching) trialed 09/2018, 09/2021, 11/2021 - eventually discontinued 03/26/22 Amlodipine - gingival hyperplasia   Past Medical History:  Diagnosis Date   Anemia    Pernicious   Angio-edema    Hypertension    Recurrent upper respiratory infection (URI)    Resistant hypertension 09/14/2018   Urticaria     Past Surgical History:  Procedure Laterality Date  ADENOIDECTOMY     MYOMECTOMY     Uterine Fibroids   SINOSCOPY     SINUS EXPLORATION     TONSILLECTOMY      Current Medications: Current Meds  Medication Sig   albuterol (PROAIR HFA) 108 (90 Base) MCG/ACT inhaler Inhale 2 puffs into the lungs every 4 (four) hours as needed for wheezing or shortness of breath.   buPROPion (WELLBUTRIN SR) 150 MG 12 hr tablet Take 1 tablet (150 mg total) by mouth  daily.   buPROPion (WELLBUTRIN XL) 300 MG 24 hr tablet Take 1 tablet (300 mg total) by mouth daily.   carvedilol (COREG) 25 MG tablet TAKE 1 TABLET BY MOUTH TWICE A DAY WITH MEALS   cetirizine (ZYRTEC) 10 MG tablet Take 10 mg by mouth daily.   chlorthalidone (HYGROTON) 25 MG tablet Take 1 tablet (25 mg total) by mouth daily.   cloNIDine (CATAPRES - DOSED IN MG/24 HR) 0.3 mg/24hr patch Place 1 patch (0.3 mg total) onto the skin once a week.   EPINEPHrine 0.3 mg/0.3 mL IJ SOAJ injection Inject 0.3 mg into the muscle as needed for anaphylaxis.   famotidine (PEPCID) 20 MG tablet TAKE 1 TABLET BY MOUTH TWICE A DAY (Patient taking differently: Take 20 mg by mouth daily.)   fluticasone (FLONASE) 50 MCG/ACT nasal spray Place 2 sprays into both nostrils daily.   hydrALAZINE (APRESOLINE) 50 MG tablet Take 1 tablet (50 mg total) by mouth 3 (three) times daily.   KLOR-CON M10 10 MEQ tablet TAKE 1 TABLET BY MOUTH TWICE A DAY   triamcinolone cream (KENALOG) 0.1 % APPLY 1 APPLICATION. TOPICALLY 2 (TWO) TIMES DAILY.   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  Tobacco Use   Smoking status: Every Day    Packs/day: 0.25    Years: 20.00    Total pack years: 5.00    Types: Cigarettes    Passive exposure: Current   Smokeless tobacco: Never  Vaping Use   Vaping Use: Never used  Substance and Sexual Activity   Alcohol use: No   Drug use: No   Sexual activity: Yes    Partners: Male    Birth  control/protection: None  Other Topics Concern   Not on file  Social History Narrative   Not on file   Social Determinants of Health   Financial Resource Strain: Low Risk  (09/15/2021)   Overall Financial Resource Strain (CARDIA)    Difficulty of Paying Living Expenses: Not hard at all  Food Insecurity: No Food Insecurity (09/15/2021)   Hunger Vital Sign    Worried About Running Out of Food in the Last Year: Never true    Ran Out of Food in the Last Year: Never true  Transportation Needs: No Transportation Needs (09/15/2021)   PRAPARE - Hydrologist (Medical): No    Lack of Transportation (Non-Medical): No  Physical Activity: Insufficiently Active (09/15/2021)  Exercise Vital Sign    Days of Exercise per Week: 2 days    Minutes of Exercise per Session: 20 min  Stress: No Stress Concern Present (09/15/2021)   [place]    Feeling of Stress : Not at all  Social Connections: Moderately Isolated (09/15/2021)   Social Connection and Isolation Panel [NHANES]    Frequency of Communication with Friends and Family: More than three times a week    Frequency of Social Gatherings with Friends and Family: Three times a week    Attends Religious Services: Never    Active Member of Clubs or Organizations: No    Attends Music therapist: Never    Marital Status: Married     Family History: The patient's family history includes Cancer in her maternal grandmother and paternal grandfather; Diabetes in her father, maternal grandfather, maternal grandmother, mother, and paternal grandmother; Heart disease in her maternal grandmother and mother; Hypertension in her father, maternal grandmother, mother, paternal grandfather, and paternal grandmother; Hypotension in her maternal grandfather; Kidney disease in her maternal grandmother; Other in her maternal grandfather; Stroke in her  mother.  ROS:   Please see the history of present illness.      All other systems reviewed and are negative.  EKGs/Labs/Other Studies Reviewed:    The following studies were reviewed today:  11/09/2018 VAS US Renal Artery Doppler Summary:    Possible bilateral renal artery stenosis but accuracy of study limited by  vessel  tortuosity.  Consideration of CT Angiogram or contrast angiogram if pt renal function  is  reasonable and still high index of suspicion.     12/18/2018 CT Angio/abdm/pelvis IMPRESSION: VASCULAR   1. No evidence of renal artery stenosis or other lesion to suggest a renovascular component of hypertension.   NON-VASCULAR:.   1. Fatty liver. 2. Scattered descending and sigmoid diverticula. 3. Degenerative disc disease L4-S1.    EKG:  EKG is personally reviewed. 10/11/2022: EKG was not ordered. 07/01/2022: NSR 68 bpm, no acute ST/T wave changes.    Recent Labs: 02/25/2022: TSH 2.330 08/31/2022: ALT 40; BUN 14; Creatinine, Ser 1.15; Hemoglobin 13.1; Platelets 277; Potassium 3.3; Sodium 140   Recent Lipid Panel    Component Value Date/Time   CHOL 168 08/31/2022 1023   TRIG 80 08/31/2022 1023   HDL 60 08/31/2022 1023   CHOLHDL 2.8 08/31/2022 1023   CHOLHDL 3 10/18/2018 1027   VLDL 14.0 10/18/2018 1027   LDLCALC 93 08/31/2022 1023     Risk Assessment/Calculations:      Physical Exam:    VS:  BP (!) 152/92 (BP Location: Right Arm, Patient Position: Sitting, Cuff Size: Large)   Pulse (!) 58   Ht '5\' 7"'$  (1.702 m)   Wt 267 lb 11.2 oz (121.4 kg)   SpO2 98%   BMI 41.93 kg/m     Wt Readings from Last 3 Encounters:  10/11/22 267 lb 11.2 oz (121.4 kg)  08/31/22 268 lb (121.6 kg)  07/01/22 267 lb (121.1 kg)     VS:  BP (!) 152/92 (BP Location: Right Arm, Patient Position: Sitting, Cuff Size: Large)   Pulse (!) 58   Ht '5\' 7"'$  (1.702 m)   Wt 267 lb 11.2 oz (121.4 kg)   SpO2 98%   BMI 41.93 kg/m  , BMI Body mass index is 41.93 kg/m. GENERAL:   Well appearing HEENT: Pupils equal round and reactive, fundi not visualized, oral mucosa unremarkable NECK:  No  jugular venous distention, waveform within normal limits, carotid upstroke brisk and symmetric, no bruits, no thyromegaly LUNGS:  Clear to auscultation bilaterally HEART:  RRR.  PMI not displaced or sustained,S1 and S2 within normal limits, no S3, no S4, no clicks, no rubs, no murmurs ABD:  Flat, positive bowel sounds normal in frequency in pitch, no bruits, no rebound, no guarding, no midline pulsatile mass, no hepatomegaly, no splenomegaly EXT:  2 plus pulses throughout, no edema, no cyanosis no clubbing SKIN:  No rashes no nodules NEURO:  Cranial nerves II through XII grossly intact, motor grossly intact throughout PSYCH:  Cognitively intact, oriented to person place and time   ASSESSMENT:    1. Resistant hypertension   2. Tobacco use disorder, continuous    PLAN:    Resistant hypertension Amanda Barrett blood pressure remains uncontrolled despite being on multiple medications.  No modifiable causes of secondary hypertension have been identified.  She continues working on lifestyle modification and has medication side effects as listed above.  She is an excellent candidate for renal denervation.  We will refer her for RDN with Dr. Fletcher Anon.  Will start the prior authorization process.  For now, continue current management with carvedilol, chlorthalidone, clonidine, and hydralazine.  She had angioedema with an ARB and therefore cannot be on an ACE inhibitor or ARB.  Tobacco use disorder, continuous Continue working on smoking cessation.  Screening for Secondary Hypertension:      07/01/2022    1:31 PM  Causes  Drugs/Herbals Screened  Renovascular HTN Screened     - Comments 12/2018 negative CT  Sleep Apnea Screened     - Comments 06/2022 [facility] sleep study  Thyroid Disease Screened     - Comments 02/2022 normal TSH  Hyperaldosteronism Screened     - Comments 11/2018 normal  renin aldosterone  Pheochromocytoma Screened     - Comments 06/2022 labs ordered  Cushing's Syndrome Screened     - Comments 06/2022 labs ordered  Coarctation of the Aorta Not Screened  Compliance Screened     - Comments 06/2022 Hydralaine BID instead of QID    Relevant Labs/Studies:    Latest Ref Rng & Units 08/31/2022   10:23 AM 04/26/2022    8:53 AM 02/25/2022    4:38 PM  Basic Labs  Sodium 134 - 144 mmol/L 140  141  143   Potassium 3.5 - 5.2 mmol/L 3.3  3.6  3.4   Creatinine 0.57 - 1.00 mg/dL 1.15  1.08  0.99        Latest Ref Rng & Units 02/25/2022    4:38 PM 09/22/2021   10:55 AM  Thyroid   TSH 0.450 - 4.500 uIU/mL 2.330  2.990          Latest Ref Rng & Units 08/18/2022   10:57 AM  Metanephrines/Catecholamines   Epinephrine 0 - 62 pg/mL 18   Norepinephrine 0 - 874 pg/mL 266   Dopamine 0 - 48 pg/mL <30   Metanephrines 0.0 - 88.0 pg/mL <25.0   Normetanephrines  0.0 - 218.9 pg/mL 36.0           11/09/2018    9:18 AM  Renovascular   Renal Artery [place] Completed Yes     Disposition: FU with Laurann Montana, NP in 1 month, and with Amanda Karan C. Oval Linsey, MD, [facility] in 4 months.  Medication Adjustments/Labs and Tests Ordered: Current medicines are reviewed at length with the patient today.  Concerns regarding medicines are outlined above.  No orders of  the defined types were placed in this encounter.  No orders of the defined types were placed in this encounter.  Patient Instructions  Medication Instructions:  Your physician recommends that you continue on your current medications as directed. Please refer to the Current Medication list given to you today.   Labwork: NONE   Testing/Procedures: NONE  Follow-Up: 11/11/2022 11:20 AM WITH CAITLIN W NP    02/14/2023 1:30 PM WITH DR Drummond   Any Other Special Instructions Will Be Listed Below (If Applicable). SOMEONE WILL REACH OUT TO YOU REGARDING RENAL DENERVATION DISCUSSED TODAY   If you need a refill on your  cardiac medications before your next appointment, please call your pharmacy.     I,Mitra Faeizi,acting as a Education administrator for [place], MD.,have documented all relevant documentation on the behalf of Skeet Latch, MD,as directed by  Skeet Latch, MD while in the presence of Skeet Latch, MD.  I, [place] Oval Linsey, MD have reviewed all documentation for this visit.  The documentation of the exam, diagnosis, procedures, and orders on 10/15/2022 are all accurate and complete.   Signed, Skeet Latch, MD  10/15/2022 11:34 AM    [place]

## 2022-10-11 NOTE — Research (Signed)
I saw pt today after Dr. Blenda Mounts follow up visit. Pt is in Dr. Blenda Mounts Virtual Care HTN Study. Pt filled out research survey. Pt is enrolled in Group 1. Pt will follow up in 1 month. Pt is interested in Renal Denervation procedure.

## 2022-10-15 ENCOUNTER — Encounter (HOSPITAL_BASED_OUTPATIENT_CLINIC_OR_DEPARTMENT_OTHER): Payer: Self-pay | Admitting: Cardiovascular Disease

## 2022-10-15 NOTE — Assessment & Plan Note (Signed)
Continue working on smoking cessation.

## 2022-10-15 NOTE — Assessment & Plan Note (Signed)
Amanda Barrett's blood pressure remains uncontrolled despite being on multiple medications.  No modifiable causes of secondary hypertension have been identified.  She continues working on lifestyle modification and has medication side effects as listed above.  She is an excellent candidate for renal denervation.  We will refer her for RDN with Dr. Fletcher Anon.  Will start the prior authorization process.  For now, continue current management with carvedilol, chlorthalidone, clonidine, and hydralazine.  She had angioedema with an ARB and therefore cannot be on an ACE inhibitor or ARB.

## 2022-10-16 ENCOUNTER — Other Ambulatory Visit: Payer: Self-pay | Admitting: Nurse Practitioner

## 2022-10-16 DIAGNOSIS — E876 Hypokalemia: Secondary | ICD-10-CM

## 2022-10-17 ENCOUNTER — Other Ambulatory Visit: Payer: Self-pay | Admitting: Nurse Practitioner

## 2022-10-17 DIAGNOSIS — R21 Rash and other nonspecific skin eruption: Secondary | ICD-10-CM

## 2022-10-19 ENCOUNTER — Ambulatory Visit: Payer: BC Managed Care – PPO | Attending: Cardiology

## 2022-10-19 DIAGNOSIS — Z0000 Encounter for general adult medical examination without abnormal findings: Secondary | ICD-10-CM

## 2022-10-19 NOTE — Progress Notes (Signed)
Appointment Outcome: Completed, Session #: 4                        Start time: 2:15pm   End time: 2:50pm   Total Mins: 35 minutes   AGREEMENTS SECTION   Overall Goal(s): Smoking cessation Increase physical activity Improve healthy eating habits                                            Agreement/Action Steps Smoking Cessation Practice mini quits Track time in between cigarettes Extend longest time without smoking from 48 hours to 56-72 hours When triggered to smoke implement positive self-talk Engage in household chores when triggered to smoke Take Wellbutrin as prescribed   Increase Physical Activity Exercise 3xs/week for 30 minutes between 8-9am Utilize home exercise and Yoga videos on YouTube   Improving Health Eating Habits Practice portion control Read food labels Aim for '1500mg'$  of sodium/day Meal planning Incorporate fruits and vegetables  Progress Notes:  Patient stated that she has limited her smoking to 3 cigarettes per day when she does smoke. Patient mentioned that has been able to extend the time between smoking longer. Patient reported that she finds herself not smoking until the evening. Patient recently recognized that she went without a cigarette all day Saturday when she realized at 3:00pm she had not smoked. Patient stated that when she recognizes she hasn't smoked in a while it's typically when winding down for bed. Discussed with patients something she could do to keep her hands busy. Patient stated that reading has been instrumental in distracting herself from smoking.   Patient mentioned that she doesn't have the urge to smoke like after she wakes up. Patient shared that smoking is not a necessity as much after eating. Patient shared that there are times she refrains from smoking (e.g., being around other, after seeing an increase in her blood pressure, and when busy). Patient shared her dosing for Wellbutrin where she has a higher dose in the morning and  lower in the evenings. Patient mentioned that when she does think about smoking in the evenings before bed, she asks herself questions and use positive self-talk to deter herself from smoking. Patient shared that during this time she is weighing out the pros and cons of smoking. Patient expressed that smoking is no longer a reward and something she looks forward to doing like before. Patient finds herself not smoking as much because she does not want to stimulate herself or smell like smoke around others.   Patient stated that she exercised approximately 20 minutes twice last week. Patient reported that she followed a walking video to increase her steps while walking indoors. Patient mentioned that she runs off adrenaline when she is working and do not stop to eat on certain days. Patient expressed that this is a behavior she will work on during the next event she plans. Patient stated that by her working on establishing a routine before her next event, she is confident that the increase in her self-awareness will help with being consistent.   Patient reported that practicing portion control is going well. Patient confidence has increased in her ability to make healthier food choices such as ordering a smaller fry vs a regular fry. Patient shared that she is purchasing low-sodium, or no sodium added canned vegetables, but purchases fresh vegetables as well. Patient  stated that when reading food labels, she is monitoring her sodium intake. Patient mentioned that she finds alternatives or substitute parts of a recipe and use less seasoning to reduce her sodium intake.  Patient shared that she has purchased fruit and vegetables that she eats for snacks such as grapes and carrot chips. Patient mentioned that she has begun to incorporate smoothies with [place] yogurt. Patient shared that meal planning has improved, which has been helpful in choosing what to purchase from the grocery store and improve her eating  habits. Patient expressed that she was proud of herself for the progress that she has made.   Indicators of Success and Accountability:  Patient stated that practicing portion control is the most successful indicator of holding herself accountable. Readiness: Patient is in the action stage of increasing physical activity, improving eating habits, and reduce her smoking. Strengths and Supports: Patient is being supported by her husband. Patient is relying on her increase in self-awareness and confidence to make better food choices. Challenges and Barriers: Patient does not foresee any challenges to implementing her action steps over the next two weeks.   Coaching Outcomes: Patient stated that she is interested in using her air fryer more as an alternative method to preparing meals.  Patient stated that she wants to focus more on increasing her physical activity over the next two weeks. Patient will continue to research videos on YouTube that will help pique her interest in moving more.   Patient plans to explore activities that she can engage in to distract her from eating such as crossword puzzles and/or games on her phone.  Patient will implement the following action steps as outlined below over the next two weeks.  Agreement/Action Steps Smoking Cessation Practice mini quits Track time in between cigarettes Extend longest time without smoking from 48 hours to 56-72 hours When triggered to smoke implement positive self-talk Engage in activities to keep hands busy Read a book Take Wellbutrin as prescribed   Increase Physical Activity Exercise 3xs/week for 30 minutes between 8-9am Utilize home exercise and Yoga videos on YouTube   Improving Health Eating Habits Practice portion control Read food labels Aim for '1500mg'$  of sodium/day Meal planning Incorporate fruits and vegetables Use air fryer as an alternative cooking method    Attempted: Fulfilled - Patient was able to  implement all action steps outlined to improve her eating habits. Patient tracked time between smoking, implement positive self-talk when triggered, and take Wellbutrin as prescribed.  Partial - Patient has continued to extend the longest time frame without smoking to 48 hours but has not gone longer. Patient exercised twice in the past two weeks for 20 minutes using exercise videos on YouTube.

## 2022-10-22 ENCOUNTER — Other Ambulatory Visit: Payer: Self-pay | Admitting: Nurse Practitioner

## 2022-10-22 DIAGNOSIS — F5101 Primary insomnia: Secondary | ICD-10-CM

## 2022-10-25 ENCOUNTER — Ambulatory Visit: Payer: BC Managed Care – PPO | Attending: Cardiology | Admitting: Cardiology

## 2022-10-25 ENCOUNTER — Encounter: Payer: Self-pay | Admitting: Cardiology

## 2022-10-25 VITALS — BP 157/88 | HR 76 | Ht 67.0 in | Wt 268.6 lb

## 2022-10-25 DIAGNOSIS — I10 Essential (primary) hypertension: Secondary | ICD-10-CM

## 2022-10-25 DIAGNOSIS — G4733 Obstructive sleep apnea (adult) (pediatric): Secondary | ICD-10-CM | POA: Diagnosis not present

## 2022-10-25 NOTE — Patient Instructions (Signed)
Medication Instructions:  Your physician recommends that you continue on your current medications as directed. Please refer to the Current Medication list given to you today.  *If you need a refill on your cardiac medications before your next appointment, please call your pharmacy*   Lab Work: None.  If you have labs (blood work) drawn today and your tests are completely normal, you will receive your results only by: [place] (if you have MyChart) OR A paper copy in the mail If you have any lab test that is abnormal or we need to change your treatment, we will call you to review the results.   Testing/Procedures: None.   Follow-Up:  Your next appointment:  January 28, 2023 at 10:00 AM.  Provider:   Dr. Fransico Him, MD

## 2022-10-25 NOTE — Progress Notes (Signed)
Sleep Medicine CONSULT Note    Date:  10/25/2022   ID:  Amanda Barrett, DOB [birth year], MRN [id number]  PCP:  [facility]  Cardiologist: Skeet Latch, MD   Chief Complaint  Patient presents with   New Patient (Initial Visit)    osa    History of Present Illness:  Amanda Barrett is a 47 y.o. female who is being seen today for the evaluation of OSA at the request of Skeet Latch, MD.  This is a 47 year old female with a history of hypertension that has been resistant to treatment in the past.  She was seen by Laurann Montana, NP in September and complained of snoring and nonrestorative sleep.  STOP-BANG score 5.  She underwent home sleep study showing mild obstructive sleep apnea with an AHI of 8.4/h.  She was placed on auto CPAP from 4 to 15 cm H2O.  She is now been referred to sleep medicine to establish sleep care for treatment of her sleep apnea.  She has been struggling with her PAP device recently.  She had been using it but went out of town and did not take it with her and then did not restart it.   She tolerates the full face mask and feels the pressure is adequate.  Since going on PAP she feels rested in the am and has no significant daytime sleepiness.  She does not wake up at night when using her device.  She denies any significant mouth or nasal dryness or nasal congestion.  She does not think that he snores.    Past Medical History:  Diagnosis Date   Anemia    Pernicious   Angio-edema    Hypertension    Recurrent upper respiratory infection (URI)    Resistant hypertension 09/14/2018   Urticaria     Past Surgical History:  Procedure Laterality Date   ADENOIDECTOMY     MYOMECTOMY     Uterine Fibroids   SINOSCOPY     SINUS EXPLORATION     TONSILLECTOMY      Current Medications: Current Meds  Medication Sig   albuterol (PROAIR HFA) 108 (90 Base) MCG/ACT inhaler Inhale 2 puffs into the lungs every 4 (four) hours as needed for wheezing or  shortness of breath.   buPROPion (WELLBUTRIN SR) 150 MG 12 hr tablet Take 1 tablet (150 mg total) by mouth daily.   buPROPion (WELLBUTRIN XL) 300 MG 24 hr tablet Take 1 tablet (300 mg total) by mouth daily.   carvedilol (COREG) 25 MG tablet TAKE 1 TABLET BY MOUTH TWICE A DAY WITH MEALS   cetirizine (ZYRTEC) 10 MG tablet Take 10 mg by mouth daily.   chlorthalidone (HYGROTON) 25 MG tablet Take 1 tablet (25 mg total) by mouth daily.   cloNIDine (CATAPRES - DOSED IN MG/24 HR) 0.3 mg/24hr patch Place 1 patch (0.3 mg total) onto the skin once a week.   EPINEPHrine 0.3 mg/0.3 mL IJ SOAJ injection Inject 0.3 mg into the muscle as needed for anaphylaxis.   famotidine (PEPCID) 20 MG tablet TAKE 1 TABLET BY MOUTH TWICE A DAY   fluticasone (FLONASE) 50 MCG/ACT nasal spray Place 2 sprays into both nostrils daily.   hydrALAZINE (APRESOLINE) 50 MG tablet Take 1 tablet (50 mg total) by mouth 3 (three) times daily.   KLOR-CON M10 10 MEQ tablet TAKE 1 TABLET BY MOUTH TWICE A DAY   triamcinolone cream (KENALOG) 0.1 % APPLY 1 APPLICATION. TOPICALLY 2 (TWO) TIMES DAILY.  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  Tobacco Use   Smoking status: Every Day    Packs/day: 0.25    Years: 20.00    Total pack years: 5.00    Types: Cigarettes    Passive exposure: Current   Smokeless tobacco: Never  Vaping Use   Vaping Use: Never used  Substance and Sexual Activity   Alcohol use: No   Drug use: No   Sexual  activity: Yes    Partners: Male    Birth control/protection: None  Other Topics Concern   Not on file  Social History Narrative   Not on file   Social Determinants of Health   Financial Resource Strain: Low Risk  (09/15/2021)   Overall Financial Resource Strain (CARDIA)    Difficulty of Paying Living Expenses: Not hard at all  Food Insecurity: No Food Insecurity (09/15/2021)   Hunger Vital Sign    Worried About Running Out of Food in the Last Year: Never true    Ran Out of Food in the Last Year: Never true  Transportation Needs: No Transportation Needs (09/15/2021)   PRAPARE - Hydrologist (Medical): No    Lack of Transportation (Non-Medical): No  Physical Activity: Insufficiently Active (09/15/2021)   Exercise Vital Sign    Days of Exercise per Week: 2 days    Minutes of Exercise per Session: 20 min  Stress: No Stress Concern Present (09/15/2021)   [place]    Feeling of Stress : Not at all  Social Connections: Moderately Isolated (09/15/2021)   Social Connection and Isolation Panel [NHANES]    Frequency of Communication with Friends and Family: More than three times a week    Frequency of Social Gatherings with Friends and Family: Three times a week    Attends Religious Services: Never    Active Member of Clubs or Organizations: No    Attends Music therapist: Never    Marital Status: Married     Family History:  The patient's family history includes Cancer in her maternal grandmother and paternal grandfather; Diabetes in her father, maternal grandfather, maternal grandmother, mother, and paternal grandmother; Heart disease in her maternal grandmother and mother; Hypertension in her father, maternal grandmother, mother, paternal grandfather, and paternal grandmother; Hypotension in her maternal grandfather; Kidney disease in her maternal grandmother; Other in her  maternal grandfather; Stroke in her mother.   ROS:   Please see the history of present illness.    ROS All other systems reviewed and are negative.      No data to display             PHYSICAL EXAM:   VS:  BP (!) 157/88   Pulse 76   Ht '5\' 7"'$  (1.702 m)   Wt 268 lb 9.6 oz (121.8 kg)   SpO2 98%   BMI 42.07 kg/m  GEN: Well nourished, well developed, in no acute distress  HEENT: normal  Neck: no JVD, carotid bruits, or masses Cardiac: RRR; no murmurs, rubs, or gallops,no edema.  Intact distal pulses bilaterally.  Respiratory:  clear to auscultation bilaterally, normal work of breathing GI: soft, nontender, nondistended, + BS MS: no deformity or atrophy  Skin: warm and dry, no rash Neuro:  Alert and Oriented x 3, Strength and sensation are intact Psych: euthymic mood, full affect  Wt Readings from Last 3 Encounters:  10/25/22 268 lb 9.6 oz (121.8 kg)  10/11/22 267 lb 11.2 oz (121.4 kg)  08/31/22 268 lb (121.6 kg)      Studies/Labs Reviewed:   Home sleep study and PAP compliance download  Recent Labs: 02/25/2022: TSH 2.330 08/31/2022: ALT 40; BUN 14; Creatinine, Ser 1.15; Hemoglobin 13.1; Platelets 277; Potassium 3.3; Sodium 140    Additional studies/ records that were reviewed today include:  Office visit notes by Laurann Montana, NP    ASSESSMENT:    1. OSA (obstructive sleep apnea)   2. Essential hypertension      PLAN:  In order of problems listed above:  OSA - The patient is tolerating PAP therapy well without any problems. The PAP download performed by his DME was personally reviewed and interpreted by me today and showed an AHI of 2.1/hr on 4-15 cm H2O with 50% compliance in using more than 4 hours nightly up until the end of December.  The patient has been using and benefiting from PAP use and will continue to benefit from therapy.  -she stopped using it after the Holidays and I have encouraged her to get back on the CPAP and improve compliance -we  discussed the role untreated OSA plays in HTN  2.  Hypertension -BP elevated on exam today -Continue prescription drug management with carvedilol 25 mg twice daily, chlorthalidone 25 mg daily, clonidine patch 0.3 mg / 24-hour patch, hydralazine 50 mg 3 times daily with as needed refills -she has followup with HTN clinic in a few weeks   Time Spent: 20 minutes total time of encounter, including 15 minutes spent in face-to-face patient care on the date of this encounter. This time includes coordination of care and counseling regarding above mentioned problem list. Remainder of non-face-to-face time involved reviewing chart documents/testing relevant to the patient encounter and documentation in the medical record. I have independently reviewed documentation from referring provider  Medication Adjustments/Labs and Tests Ordered: Current medicines are reviewed at length with the patient today.  Concerns regarding medicines are outlined above.  Medication changes, Labs and Tests ordered today are listed in the Patient Instructions below.  There are no Patient Instructions on file for this visit.  Followup with me in 3 months   Signed, Fransico Him, MD  10/25/2022 2:58 PM    [address], [place], [place]  [address] Phone: [phone]; Fax: [phone]

## 2022-10-29 ENCOUNTER — Other Ambulatory Visit: Payer: Self-pay | Admitting: Nurse Practitioner

## 2022-10-29 DIAGNOSIS — I10 Essential (primary) hypertension: Secondary | ICD-10-CM

## 2022-10-30 DIAGNOSIS — G4733 Obstructive sleep apnea (adult) (pediatric): Secondary | ICD-10-CM | POA: Diagnosis not present

## 2022-11-02 ENCOUNTER — Ambulatory Visit: Payer: BC Managed Care – PPO | Attending: Cardiology

## 2022-11-02 DIAGNOSIS — G4733 Obstructive sleep apnea (adult) (pediatric): Secondary | ICD-10-CM | POA: Diagnosis not present

## 2022-11-02 DIAGNOSIS — Z0000 Encounter for general adult medical examination without abnormal findings: Secondary | ICD-10-CM

## 2022-11-02 NOTE — Progress Notes (Signed)
Appointment Outcome: Completed, Session #: 5                       Start time: 2:13pm   End time: 2:46pm   Total Mins: 33 minutes    AGREEMENTS SECTION   Overall Goal(s): Smoking cessation Increase physical activity Improve healthy eating habits                                             Agreement/Action Steps Smoking Cessation Practice mini quits Track time in between cigarettes Extend longest time without smoking from 48 hours to 56-72 hours When triggered to smoke implement positive self-talk Engage in activities to keep hands busy Read a book Take Wellbutrin as prescribed   Increase Physical Activity Exercise 3xs/week for 30 minutes between 8-9am Utilize home exercise and Yoga videos on YouTube   Improving Healthy Eating Habits Practice portion control Read food labels Aim for '1500mg'$  of sodium/day Meal planning Incorporate fruits and vegetables Use air fryer as an alternative cooking method    Progress Notes:  Patient stated that she is smoking about one pack of cigarettes per day, equaling to lighting 3 cigarettes per day. Patient stated that she only smokes half of the cigarette and throw the other half away. Patient stated that she will stop herself from smoking and tell herself that she doesn't need any more. Patient reported that she continues to take Wellbutrin as prescribed.   Patient shared that she has not been able to extend the time between smoking cigarettes more than two days at a time. Patient stated that she finds herself smoking more in the evening than the daytime. Patient expressed that her smoking is contributed to boredom. Patient stated that reading at any time is helpful to deter her from smoking. Patient stated that she would like to download games to engage during the evening.   Patient mentioned that she has not set a quit date not because she doesn't want to quit, but don't want to fail by not meeting the quit date. Patient stated that she  feels she is progressing with reducing her smoking. Patient shared that she realizes that if she is distracted, she doesn't smoke as much. Discussed with patient about having a support person around reducing her smoking such as calling a friend in the evening. Patient stated that she can try that at times, but her husband notices her smoking behaviors and has been supportive.   Patient has exercised twice/weekly over the past two weeks for 20-30 minutes at a time. Patient mentioned that she is using walking/stepping videos on YouTube by "[employer]." Patient stated that during her workout, she can get approximately 2,500 steps. Patient stated that she feels good about getting started on creating an exercise routine.   Patient shared that meal planning is getting better. Patient reported that she is preparing meals for 5 days/week and eat out maybe once or twice. Patient stated that she needs to stock up on foods that are healthy and include protein shakes. Patient mentioned that she can eat breakfast but finds that she has a hard time eating lunch regularly and feels that this is not a good habit when trying to manage her glucose. Discuss with patient the idea of incorporating fruit/vegetables and/or protein shakes as an alternative for lunch verses a meal. Patient has incorporated protein  shakes before in her diet and is interested in doing so again.   Patient reported that practicing portion control is going well and she continues to implement the same strategies to maintain this step. Patient stated that she is monitoring her sodium intake by looking at the labels but is not tracking to determine if she is maintaining consumption of 1,'500mg'$  of sodium per day. Patient mentioned that she is more conscious of what she is consuming and has focused on making healthier choices. Patient shared that she purchases fresh produce at times to replace can goods. Patient does not use salt when cooking. Patient  stated that she has not started using the air fryer but have looked at recipes. Patient mentioned that she has not become familiar with using the cookware.    Indicators of Success and Accountability:  Patient has begun exercising and smokes approximately 1.5 cigarettes per day.  Readiness: Patient stated that she rates her readiness at a 5/5 to make changes. Patient is in the action stage of smoking cessation, increasing physical activity, and improving eating habits.   Strengths and Supports: Patient stated that her husband is her biggest support but will start engaging a friend as a support. Patient is resourceful and optimistic.   Challenges and Barriers: Patient stated that boredom is a challenge to smoking cessation.    Coaching Outcomes: Patient's action steps have been revised as outlined below to implement over the next two weeks.    Smoking Cessation Practice mini quits to only light two cigarettes per day Track time in between cigarettes Go without smoking for one day Smoke half a cigarette at a time When triggered to smoke implement positive self-talk Engage in activities to keep hands busy Read a book Call friend in the evening Take Wellbutrin as prescribed   Increase Physical Activity Exercise 3xs/week for 30 minutes between 8-9am Utilize walking/stepping exercise videos on YouTube   Improving Healthy Eating Habits Practice portion control Read food labels Aim for '1500mg'$  of sodium/day Meal planning Incorporate fruits and vegetables Go grocery shopping to stock up on healthier food choices Purchase protein shakes   Attempted: Fulfilled - Patient was able to practice mini quits to reduce smoking and engage in activities when triggered to smoke. Patient continues to take Wellbutrin. Patient is incorporating fruit and vegetables as snacks. Patient continues to practice portion control. Patient has been able to incorporate meal planning to be able to cook to eat at  home 5/7 days/week.  Partial - Patient exercised twice per week for 20-30 minutes each time. Patient was able to implement positive self-talk at times to deter smoking. Patient is reading food labels to bring awareness to sodium consumption but does not track her intake to aim for '1500mg'$ /day.  Not met - Patient has not used the air fryer to prepare meals over the past two weeks. Patient was not able to extend the longest time between smoking past 48 hrs.

## 2022-11-09 NOTE — Progress Notes (Signed)
 "  Advanced Hypertension Clinic Initial Assessment:    Date:  11/11/2022   ID:  Amanda Barrett, DOB 07-02-1976, MRN [id number]  PCP:  [facility]  Cardiologist:  Annabella Scarce, MD  Nephrologist:  Referring MD: [facility]   CC: Hypertension  History of Present Illness:    Amanda Barrett is a 47 y.o. female with a hx of hypertension, anemia, OSA, vitamin D  deficiency, depression, tobacco use here to follow up in the Advanced Hypertension Clinic.   Amanda Barrett was diagnosed with hypertension when she was 47 years old. Then started ot have some gum problems with Amlodipine . 01/2022 developed hives having to use epipen  with ED visit 03/10/22 for angioedema. Her Nifedipine  dose was increased - hives went away for a few days then as soon as Prednisone  dose completed has recurrent hives and Nifedipine  discontinued. 04/2022 Clonidine  initiated which took some time to adjust to. She was taking Hydralazine  twice per day as midday dosing was difficult to keep up with. She saw pharmacy team 08/17/22 noting drowsiness. HCTZ was switched to Chlorthalidone  25mg  QD and Clonidine  switched to patch.   Last saw Dr. Scarce 10/11/22 and it was uncontrolled despite multiple medications. She was referred to Dr. Darron for possible renal denervation. Insurance denied RDN, appealing on her behalf. Carvedilol , Chlorthalidone , Clonidine , Hydralazine  were continued.   Saw Dr. Shlomo 10/25/22 for OSA follow-up and was tolerating CPAP and encouraged to get back to using more regularly since the holiday season.   She presents today for follow up. Works running a acupuncturist.  She has been doing well from a cardiac perspective.  She has been taking her midday dose of hydralazine , typically around 4:30 PM.  Vivify blood pressure readings reviewed, her blood pressure tends to be elevated around 1030 to 11 PM, 170's/100's. She denies chest pain, palpitations, dyspnea, pnd, orthopnea, n, v, dizziness, syncope,  edema, weight gain, or early satiety.   Previous antihypertensives: Valsartan  - allergic reaction (swelling) 01/2022 Nifedipine  - allergic reaction (hives, itching) trialed 09/2018, 09/2021, 11/2021 - eventually discontinued 03/26/22 Amlodipine  - gingival hyperplasia   Past Medical History:  Diagnosis Date   Anemia    Pernicious   Angio-edema    Hypertension    Recurrent upper respiratory infection (URI)    Resistant hypertension 09/14/2018   Urticaria     Past Surgical History:  Procedure Laterality Date   ADENOIDECTOMY     MYOMECTOMY     Uterine Fibroids   SINOSCOPY     SINUS EXPLORATION     TONSILLECTOMY      Current Medications: Current Meds  Medication Sig   albuterol  (PROAIR  HFA) 108 (90 Base) MCG/ACT inhaler Inhale 2 puffs into the lungs every 4 (four) hours as needed for wheezing or shortness of breath.   buPROPion  (WELLBUTRIN  SR) 150 MG 12 hr tablet Take 1 tablet (150 mg total) by mouth daily.   buPROPion  (WELLBUTRIN  XL) 300 MG 24 hr tablet Take 1 tablet (300 mg total) by mouth daily.   carvedilol  (COREG ) 25 MG tablet TAKE 1 TABLET BY MOUTH TWICE A DAY WITH FOOD   cetirizine  (ZYRTEC ) 10 MG tablet Take 10 mg by mouth daily.   chlorthalidone  (HYGROTON ) 25 MG tablet Take 1 tablet (25 mg total) by mouth daily.   cloNIDine  (CATAPRES  - DOSED IN MG/24 HR) 0.3 mg/24hr patch Place 1 patch (0.3 mg total) onto the skin once a week.   EPINEPHrine  0.3 mg/0.3 mL IJ SOAJ injection Inject 0.3 mg into the  muscle as needed for anaphylaxis.   famotidine  (PEPCID ) 20 MG tablet TAKE 1 TABLET BY MOUTH TWICE A DAY   fluticasone  (FLONASE ) 50 MCG/ACT nasal spray Place 2 sprays into both nostrils daily.   hydrALAZINE  (APRESOLINE ) 50 MG tablet Take 1 tablet (50 mg total) by mouth 3 (three) times daily.   KLOR-CON  M10 10 MEQ tablet TAKE 1 TABLET BY MOUTH TWICE A DAY   triamcinolone  cream (KENALOG ) 0.1 % APPLY 1 APPLICATION. TOPICALLY 2 (TWO) TIMES DAILY.   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  Tobacco Use   Smoking status: Every Day    Packs/day: 0.25    Years: 20.00    Total pack years: 5.00    Types: Cigarettes    Passive exposure: Current   Smokeless tobacco: Never  Vaping Use   Vaping Use: Never used  Substance and Sexual Activity   Alcohol use: No   Drug use: No   Sexual activity: Yes    Partners: Male    Birth control/protection: None  Other Topics Concern   Not on file  Social History Narrative   Not on file   Social Determinants of Health   Financial Resource Strain: Low Risk  (09/15/2021)   Overall Financial Resource Strain (CARDIA)    Difficulty of Paying Living Expenses: Not hard at all  Food Insecurity: No Food Insecurity (09/15/2021)   Hunger Vital Sign    Worried About Running Out of Food in the Last Year: Never true    Ran Out of Food in the Last Year: Never true  Transportation Needs: No Transportation Needs (09/15/2021)   PRAPARE - Administrator, Civil Service (Medical): No    Lack of Transportation (Non-Medical): No  Physical Activity: Insufficiently Active (09/15/2021)   Exercise Vital Sign    Days of Exercise per Week: 2 days    Minutes of Exercise per Session: 20 min  Stress: No Stress Concern Present (09/15/2021)   [employer] of Occupational Health - Occupational Stress Questionnaire    Feeling of Stress : Not at all  Social Connections: Moderately Isolated (09/15/2021)   Social Connection and Isolation  Panel [NHANES]    Frequency of Communication with Friends and Family: More than three times a week    Frequency of Social Gatherings with Friends and Family: Three times a week    Attends Religious Services: Never    Active Member of Clubs or Organizations: No    Attends Engineer, Structural: Never    Marital Status: Married     Family History: The patient's family history includes Cancer in her maternal grandmother and paternal grandfather; Diabetes in her father, maternal grandfather, maternal grandmother, mother, and paternal grandmother; Heart disease in her maternal grandmother and mother; Hypertension in her father, maternal grandmother, mother, paternal grandfather, and paternal grandmother; Hypotension in her maternal grandfather; Kidney disease in her maternal grandmother; Other in her maternal grandfather; Stroke  in her mother.  ROS:   Please see the history of present illness.     All other systems reviewed and are negative.  EKGs/Labs/Other Studies Reviewed:    EKG:  EKG is not ordered today.   11/09/18 VAS US  renal  Possible bilateral renal artery stenosis but accuracy of study limited by  vessel  tortuosity.  Consideration of CT Angiogram or contrast angiogram if pt renal function  is  reasonable and still high index of suspicion.    12/18/18 CT angio abdomen pelvis IMPRESSION: VASCULAR   1. No evidence of renal artery stenosis or other lesion to suggest a renovascular component of hypertension.   NON-VASCULAR:.   1. Fatty liver. 2. Scattered descending and sigmoid diverticula. 3. Degenerative disc disease L4-S1.  Recent Labs: 02/25/2022: TSH 2.330 08/31/2022: ALT 40; BUN 14; Creatinine, Ser 1.15; Hemoglobin 13.1; Platelets 277; Potassium 3.3; Sodium 140   Recent Lipid Panel    Component Value Date/Time   CHOL 168 08/31/2022 1023   TRIG 80 08/31/2022 1023   HDL 60 08/31/2022 1023   CHOLHDL 2.8 08/31/2022 1023   CHOLHDL 3 10/18/2018 1027   VLDL  14.0 10/18/2018 1027   LDLCALC 93 08/31/2022 1023    Physical Exam:   VS:  BP (!) 140/68 (BP Location: Left Arm, Patient Position: Sitting, Cuff Size: Large)   Pulse 72   Ht 5' 7 (1.702 m)   Wt 269 lb 11.2 oz (122.3 kg)   SpO2 95%   BMI 42.24 kg/m  , BMI Body mass index is 42.24 kg/m. GENERAL:  Well appearing, overweight HEENT: Pupils equal round and reactive, fundi not visualized, oral mucosa unremarkable NECK:  No jugular venous distention, waveform within normal limits, carotid upstroke brisk and symmetric, no bruits, no thyromegaly LYMPHATICS:  No cervical adenopathy LUNGS:  Clear to auscultation bilaterally HEART:  RRR.  PMI not displaced or sustained,S1 and S2 within normal limits, no S3, no S4, no clicks, no rubs, no murmurs ABD:  Flat, nontender, nondistended  EXT:  2 plus pulses throughout, no edema, no cyanosis no clubbing SKIN:  No rashes no nodules NEURO:  Cranial nerves II through XII grossly intact, motor grossly intact throughout PSYCH:  Cognitively intact, oriented to person place and time   ASSESSMENT/PLAN:    Hypertension - BP today 140/68 in office. Per Viv[facility] in the AM Bp often 110s/60s-120s/80s then in the evening 150s-170s/100s. Medication adjustment as below. She will continue to monitor at home with RPM study. No ACE/ARB due to history of angioedema on ARB. No Amlodipine /Nifedipine /CCB due to history of gingival hyperplasia on these medications. Medication management limited by allergies and her BP remains above goal despite multiple agents. No additional secondary sources for hypertension have been identified despite thorough testing. Her insurance has unfortunately denied renal denervation. We will submit appeal. Per her insurance not enough medical studies showing that this requested treatment is superior to or equivalent to other therapies for your condition. However, renal denervation was approved by the FDA 08/2022 for treatment of hypertension. RDN has  performed for >5 years. Research trials showed statistically significant reducation in BP in RDN studies. While there are many antihypertensive agents, she is limited in what she can take due to allergies. Unclear why her insurance considers this to be a investigation/unproven treatment and would disagree with their assessment. As above, appeal will be submitted.  Present medical therapy includes carvedilol , chlorthalidone , clonidine , hydralazine . Evening BP readings are still significantly elevated, discussed increasing evening hydralazine  dose, but she is  concerned it would lower her am BP. She would like to see if spacing her mid-day dose of hydralazine  would help. Will move midday dose of Hydralazine  to 6pm to try to get 8h in-between each dose.  OSA - CPAP compliance encouraged. She is trying to use more regularly.  Depression-follows with primary care provider  Tobacco use- Smoking cessation encouraged. Recommend utilization of [id number].   Screening for Secondary Hypertension:     07/01/2022    1:31 PM  Causes  Drugs/Herbals Screened  Renovascular HTN Screened     - Comments 12/2018 negative CT  Sleep Apnea Screened     - Comments 06/2022 [facility] sleep study  Thyroid  Disease Screened     - Comments 02/2022 normal TSH  Hyperaldosteronism Screened     - Comments 11/2018 normal renin aldosterone  Pheochromocytoma Screened     - Comments 06/2022 labs ordered  Cushing's Syndrome Screened     - Comments 06/2022 labs ordered  Coarctation of the Aorta Not Screened  Compliance Screened     - Comments 06/2022 Hydralaine BID instead of QID    Relevant Labs/Studies:    Latest Ref Rng & Units 08/31/2022   10:23 AM 04/26/2022    8:53 AM 02/25/2022    4:38 PM  Basic Labs  Sodium 134 - 144 mmol/L 140  141  143   Potassium 3.5 - 5.2 mmol/L 3.3  3.6  3.4   Creatinine 0.57 - 1.00 mg/dL 8.84  8.91  9.00        Latest Ref Rng & Units 02/25/2022    4:38 PM 09/22/2021   10:55 AM  Thyroid    TSH  0.450 - 4.500 uIU/mL 2.330  2.990        Latest Ref Rng & Units 11/06/2018    3:48 PM  Renin/Aldosterone   Aldosterone  ng/dL 21        Latest Ref Rng & Units 08/18/2022   10:57 AM  Metanephrines/Catecholamines   Epinephrine  0 - 62 pg/mL 18   Norepinephrine 0 - 874 pg/mL 266   Dopamine 0 - 48 pg/mL <30   Metanephrines 0.0 - 88.0 pg/mL <25.0   Normetanephrines  0.0 - 218.9 pg/mL 36.0           11/09/2018    9:18 AM  Renovascular   Renal Artery US  Completed Yes     Disposition:    FU with Dr. Raford in May.   Medication Adjustments/Labs and Tests Ordered: Current medicines are reviewed at length with the patient today.  Concerns regarding medicines are outlined above.  Orders Placed This Encounter  Procedures   Cantrils Ladder Assessment   No orders of the defined types were placed in this encounter.    Signed, Delon JAYSON Hoover, NP  11/11/2022 2:04 PM    [facility] "

## 2022-11-11 ENCOUNTER — Encounter (HOSPITAL_BASED_OUTPATIENT_CLINIC_OR_DEPARTMENT_OTHER): Payer: Self-pay | Admitting: Cardiology

## 2022-11-11 ENCOUNTER — Ambulatory Visit (HOSPITAL_BASED_OUTPATIENT_CLINIC_OR_DEPARTMENT_OTHER): Payer: BC Managed Care – PPO | Admitting: Cardiology

## 2022-11-11 VITALS — BP 140/68 | HR 72 | Ht 67.0 in | Wt 269.7 lb

## 2022-11-11 DIAGNOSIS — Z006 Encounter for examination for normal comparison and control in clinical research program: Secondary | ICD-10-CM

## 2022-11-11 DIAGNOSIS — G4733 Obstructive sleep apnea (adult) (pediatric): Secondary | ICD-10-CM | POA: Diagnosis not present

## 2022-11-11 DIAGNOSIS — Z720 Tobacco use: Secondary | ICD-10-CM | POA: Diagnosis not present

## 2022-11-11 DIAGNOSIS — I10 Essential (primary) hypertension: Secondary | ICD-10-CM | POA: Diagnosis not present

## 2022-11-11 NOTE — Patient Instructions (Addendum)
Medication Instructions:  Please change your midday Hydralazine to 6:00PM Continue morning and evening dose as you have been.   Labwork/Testing/Procedures: We will submit the appeal for your renal denervation.   Follow-Up: As scheduled with Dr. Oval Linsey

## 2022-11-11 NOTE — Research (Signed)
I saw pt today after Amanda Montana NP follow up visit. Pt is in Dr. Blenda Mounts [facility] HTN Study. Pt filled out research survey. Pt is enrolled in Group 2.

## 2022-11-16 ENCOUNTER — Telehealth: Payer: Self-pay

## 2022-11-16 ENCOUNTER — Ambulatory Visit: Payer: BC Managed Care – PPO | Attending: Cardiology

## 2022-11-16 ENCOUNTER — Ambulatory Visit
Admission: RE | Admit: 2022-11-16 | Discharge: 2022-11-16 | Disposition: A | Discharge status: Home / Self Care | Payer: BC Managed Care – PPO | Source: Ambulatory Visit | Attending: Physician Assistant | Admitting: Physician Assistant

## 2022-11-16 DIAGNOSIS — Z0000 Encounter for general adult medical examination without abnormal findings: Secondary | ICD-10-CM

## 2022-11-16 DIAGNOSIS — Z1231 Encounter for screening mammogram for malignant neoplasm of breast: Secondary | ICD-10-CM

## 2022-11-16 NOTE — Progress Notes (Signed)
Appointment Outcome: Completed, Session #: 6                        Start time: 2:05pm   End time: 2:40pm   Total Mins: 35 minutes  AGREEMENTS SECTION   Overall Goal(s): Smoking cessation   Increase physical activity Improve healthy eating behaviors                                                Smoking Cessation Practice mini quits to only light two cigarettes per day Track time in between cigarettes Go without smoking for one day Smoke half a cigarette at a time When triggered to smoke implement positive self-talk Engage in activities to keep hands busy Read a book Call friend in the evening Take Wellbutrin as prescribed  Increase Physical Activity Exercise 3xs/week for 30 minutes between 8-9am Utilize walking/stepping exercise videos on YouTube   Improving Healthy Eating Habits Practice portion control Read food labels Aim for 1517m of sodium/day Meal planning Incorporate fruits and vegetables Go grocery shopping to stock up on healthier food choices Purchase protein shakes    Progress Notes:  Patient reported that she is maintaining 3 cigarettes per day and has been able to smoke 1/2 cigarette at a time mostly with 1-2 cigarettes but may smoke a little more at other times. Patient shared that she tracked her longest time between smoking at 20 hours for two days. Patient mentioned that in the evenings to help with smoking, she continues to read.   Patient shared that she has not found a game to play that she likes. Patient recalled some games she previously played but felt that she needed a game that was more engaging. Patient accepted request to suggest a mind stimulating games such as Sudoku. Patient stated that she would try playing the game as a deterrent from smoking after dinner.   Patient mentioned that she texts her friend instead of calling her but doesn't interact with her daily. Patient stated that smoking in the evenings have gotten better because she is more  conscious of her behavior and is intentionally working on cutting back. Patient shared that taking Wellbutrin does help with reducing the cravings and changes the taste of cigarettes, which deters her from smoking as much.   Patient stated that she was able to exercise 3xs/week last week and twice during the previous week. Patient shared that she continues to follow the walking in place video on YouTube for 20-25 minutes at a time. Patient stated that she was able to engage in an extra day of exercise last week because she had more time and energy. Patient shared that in the mornings her blood pressure is the lowest and she doesn't have much energy to do anything until about early afternoon.   Discussed with patient how returning to work would impact her ability to maintain an exercise routine. Patient mentioned that she would have to do it earlier in the day by forcing herself to move around. Asked patient if the timeframe for exercising between 8-9am as she previously stated was still feasible. Patient stated that it would be with her work schedule.   Discussed with patient ways that she could motivate herself to exercise earlier in the day when she has the least amount of energy. Patient decided that setting an alarm between  8-9am would be helpful in reminding her to exercise, since it has been helpful with reminding her to take her midday medication.   Patient mentioned that meal planning was recently disrupted with her not being home. Patient stated that she is caring for her nephew at this time and is having to adjust to not meal planning/prepping. Patient stated that she is sticking to practicing portion control and making healthy food choices.   Patient reported that she purchased fresh fruits and vegetables when she went grocery shopping. Patient stated that she also picked up protein shakes to use as a meal replacement or to have a nutritious snack when she is hungry instead of choosing something  unhealthy. Patient shared that she continues to read food labels for sodium content and is deciding on what to eat based on the sodium per serving.   Patient stated she doesn't count the total sodium she has consumed in a day but will decide if she will or will not eat something based on if she have previously eaten something higher in sodium. Patient stated that she does not purchase frozen or boxed meals because she is aware that these food items are high in sodium. Patient mentioned that she has made changes such as not salting her eggs and cutting back from having 3-4 slices of bacon to only 1-2 slices.   Indicators of Success and Accountability: Patient has been able to increase her physical activity and go 20 hours a day for two days between cigarettes.  Readiness: Patient is in the action stage of smoking cessation, increasing physical activity, and improving healthy eating habits.   Strengths and Supports: Patient is being supported by her husband and friend. Patient is relying on self-awareness and being mindful of her behaviors.   Challenges and Barriers: Patient will be returning to work soon, which may impact the implementation of her action steps of all three of her goals over the next month.    Coaching Outcomes: Patient made the following changes that she will implement over the next month as outlined below. Patient will continue to implement all other action steps as outlined above.     Increase Physical Activity Exercise 3-4xs/week for 25-30 minutes  Utilize walking/stepping exercise videos on YouTube Set an alarm between 8-9am for motivation/reminder     Attempted: Fulfilled - Patient is tracking the time between smoking a cigarette via her practicing mini quits. When triggered to smoke, the patient engages in reading or text a friend. Patient continues to take Wellbutrin as prescribed. Patient is practicing portion control, reading food labels to monitor sodium  consumption, incorporating fruits and vegetables, and went grocery shopping to have healthy food choices available at home, along with purchasing protein shakes.   Partial - Patient was able to go 20 out of 24 hours without smoking per day for two days.  Patient stated there were times that she was able to minimize her smoking to 1/2 cigarette each time. Patient exercised 2/3 days (67%) during week one. Patient has meal planned but ran into challenges with being consistent throughout the week.

## 2022-11-16 NOTE — Telephone Encounter (Signed)
Called patient to hold session over the phone as requested by email due to a change in her schedule. Left patient a message to call back to hold session today or to reschedule.    Avelino Leeds, MS, ERHD, [facility]  Care Guide, Health & Wellness Coach [address] [place] [address] Telephone: [phone] Email: [email or web address]

## 2022-11-24 ENCOUNTER — Other Ambulatory Visit: Payer: Self-pay

## 2022-11-24 ENCOUNTER — Encounter: Payer: Self-pay | Admitting: Physician Assistant

## 2022-11-24 ENCOUNTER — Other Ambulatory Visit: Payer: Self-pay | Admitting: Physician Assistant

## 2022-11-24 DIAGNOSIS — F331 Major depressive disorder, recurrent, moderate: Secondary | ICD-10-CM

## 2022-11-24 MED ORDER — BUPROPION HCL ER (XL) 300 MG PO TB24: 300.0000 mg | ORAL_TABLET | Freq: Every day | ORAL | 0 refills | Status: DC

## 2022-12-02 DIAGNOSIS — G4733 Obstructive sleep apnea (adult) (pediatric): Secondary | ICD-10-CM | POA: Diagnosis not present

## 2022-12-06 ENCOUNTER — Encounter (HOSPITAL_BASED_OUTPATIENT_CLINIC_OR_DEPARTMENT_OTHER): Payer: Self-pay | Admitting: Cardiovascular Disease

## 2022-12-10 ENCOUNTER — Other Ambulatory Visit: Payer: Self-pay

## 2022-12-10 ENCOUNTER — Ambulatory Visit: Payer: BC Managed Care – PPO | Admitting: Physician Assistant

## 2022-12-10 ENCOUNTER — Encounter: Payer: Self-pay | Admitting: Physician Assistant

## 2022-12-10 VITALS — BP 140/86 | HR 78 | Temp 96.8°F | Resp 18 | Ht 67.0 in | Wt 270.0 lb

## 2022-12-10 DIAGNOSIS — E559 Vitamin D deficiency, unspecified: Secondary | ICD-10-CM | POA: Diagnosis not present

## 2022-12-10 DIAGNOSIS — J301 Allergic rhinitis due to pollen: Secondary | ICD-10-CM

## 2022-12-10 DIAGNOSIS — E876 Hypokalemia: Secondary | ICD-10-CM

## 2022-12-10 DIAGNOSIS — R7301 Impaired fasting glucose: Secondary | ICD-10-CM | POA: Diagnosis not present

## 2022-12-10 DIAGNOSIS — I10 Essential (primary) hypertension: Secondary | ICD-10-CM

## 2022-12-10 MED ORDER — VITAMIN D (ERGOCALCIFEROL) 1.25 MG (50000 UNIT) PO CAPS: 50000.0000 [IU] | ORAL_CAPSULE | ORAL | 2 refills | Status: DC

## 2022-12-10 NOTE — Progress Notes (Signed)
Established Patient Office Visit  Subjective:  Patient ID: Amanda Barrett, female    DOB: 1976/09/28  Age: 47 y.o. MRN: [id number] PT NEW TO ME - Amanda Barrett PT CC:  Chief Complaint  Patient presents with   Hypertension    HPI Amanda Barrett presents for chronic follow up  Pt with history of hypertension.  She has had issues since age 39.  She currently is following with the blood pressure clinic in [place] because she has had uncontrolled hypertension that has been resistant to medications and pt also allergic to a few bp medications.  She is in a study currently that is monitoring her bp readings daily.  She has had her kidneys evaluated in the past.  Currently she is trying to be approved for renal denervation Her current bp meds include coreg, chlorthalidone, clonidine, hydralazine She is also on potassium supplements  Pt with history of mild depression - states symptoms stable on wellbutrin XL '300mg'$  qd  She has history of allergic rhinitis and takes zyrtec, flonase and pepcid  Pt with history of insomnia - takes [place] as needed which works well for her  Pt with history of Vit D def - taking weekly supplement and due for labwork  Past Medical History:  Diagnosis Date   Anemia    Pernicious   Angio-edema    Hypertension    Recurrent upper respiratory infection (URI)    Resistant hypertension 09/14/2018   Urticaria     Past Surgical History:  Procedure Laterality Date   ADENOIDECTOMY     MYOMECTOMY     Uterine Fibroids   SINOSCOPY     SINUS EXPLORATION     TONSILLECTOMY      Family History  Problem Relation Age of Onset   Stroke Mother    Hypertension Mother    Diabetes Mother        type 1   Heart disease Mother    Hypertension Father    Diabetes Father        Type 2   Cancer Maternal Grandmother        Ovarian   Heart disease Maternal Grandmother    Hypertension Maternal Grandmother    Diabetes Maternal Grandmother        Type 1   Kidney disease  Maternal Grandmother    Diabetes Maternal Grandfather        Type 2   Other Maternal Grandfather        ITP   Hypotension Maternal Grandfather    Diabetes Paternal Grandmother        Type 2   Hypertension Paternal Grandmother    Cancer Paternal Grandfather        Bladder   Hypertension Paternal Grandfather     Social History   Socioeconomic History   Marital status: Married    Spouse name: Gus Designer, industrial/product   Number of children: Not on file   Years of education: Not on file   Highest education level: Not on file  Occupational History   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  Tobacco Use   Smoking status: Every Day    Packs/day: 0.25    Years: 20.00    Total pack years: 5.00    Types: Cigarettes    Passive exposure: Current   Smokeless tobacco: Never  Vaping Use   Vaping Use: Never used  Substance and Sexual Activity   Alcohol use: No   Drug use: No   Sexual activity:  Yes    Partners: Male    Birth control/protection: None  Other Topics Concern   Not on file  Social History Narrative   Not on file   Social Determinants of Health   Financial Resource Strain: Low Risk  (09/15/2021)   Overall Financial Resource Strain (CARDIA)    Difficulty of Paying Living Expenses: Not hard at all  Food Insecurity: No Food Insecurity (09/15/2021)   Hunger Vital Sign    Worried About Running Out of Food in the Last Year: Never true    Ran Out of Food in the Last Year: Never true  Transportation Needs: No Transportation Needs (09/15/2021)   PRAPARE - Hydrologist (Medical): No    Lack of Transportation (Non-Medical): No  Physical Activity: Insufficiently Active (09/15/2021)   Exercise Vital Sign    Days of Exercise per Week: 2 days    Minutes of Exercise per Session: 20 min  Stress: No Stress Concern Present (09/15/2021)   [place]    Feeling of Stress : Not  at all  Social Connections: Moderately Isolated (09/15/2021)   Social Connection and Isolation Panel [NHANES]    Frequency of Communication with Friends and Family: More than three times a week    Frequency of Social Gatherings with Friends and Family: Three times a week    Attends Religious Services: Never    Active Member of Clubs or Organizations: No    Attends Archivist Meetings: Never    Marital Status: Married  Human resources officer Violence: Not At Risk (09/15/2021)   Humiliation, Afraid, Rape, and Kick questionnaire    Fear of Current or Ex-Partner: No    Emotionally Abused: No    Physically Abused: No    Sexually Abused: No     Current Outpatient Medications:    albuterol (PROAIR HFA) 108 (90 Base) MCG/ACT inhaler, Inhale 2 puffs into the lungs every 4 (four) hours as needed for wheezing or shortness of breath., Disp: 1 each, Rfl: 2   buPROPion (WELLBUTRIN XL) 300 MG 24 hr tablet, TAKE 1 TABLET BY MOUTH EVERY DAY, Disp: 90 tablet, Rfl: 0   carvedilol (COREG) 25 MG tablet, TAKE 1 TABLET BY MOUTH TWICE A DAY WITH FOOD, Disp: 60 tablet, Rfl: 4   cetirizine (ZYRTEC) 10 MG tablet, Take 10 mg by mouth daily., Disp: , Rfl:    chlorthalidone (HYGROTON) 25 MG tablet, Take 1 tablet (25 mg total) by mouth daily., Disp: 90 tablet, Rfl: 3   cloNIDine (CATAPRES - DOSED IN MG/24 HR) 0.3 mg/24hr patch, Place 1 patch (0.3 mg total) onto the skin once a week., Disp: 4 patch, Rfl: 12   EPINEPHrine 0.3 mg/0.3 mL IJ SOAJ injection, Inject 0.3 mg into the muscle as needed for anaphylaxis., Disp: 1 each, Rfl: 1   famotidine (PEPCID) 20 MG tablet, TAKE 1 TABLET BY MOUTH TWICE A DAY, Disp: 60 tablet, Rfl: 2   fluticasone (FLONASE) 50 MCG/ACT nasal spray, Place 2 sprays into both nostrils daily., Disp: 16 g, Rfl: 0   hydrALAZINE (APRESOLINE) 50 MG tablet, Take 1 tablet (50 mg total) by mouth 3 (three) times daily., Disp: 270 tablet, Rfl: 3   KLOR-CON M10 10 MEQ tablet, TAKE 1 TABLET BY MOUTH TWICE  A DAY, Disp: 180 tablet, Rfl: 2   triamcinolone cream (KENALOG) 0.1 %, APPLY 1 APPLICATION. TOPICALLY 2 (TWO) TIMES DAILY., Disp: 30 g, Rfl: 0   Vitamin D, Ergocalciferol, (DRISDOL) 1.25  MG (50000 UNIT) CAPS capsule, TAKE 1 CAPSULE (50,000 UNITS TOTAL) BY MOUTH EVERY 7 (SEVEN) DAYS, Disp: 4 capsule, Rfl: 2   zolpidem (AMBIEN) 10 MG tablet, TAKE 1 TABLET BY MOUTH EVERYDAY AT BEDTIME, Disp: 30 tablet, Rfl: 1   Allergies  Allergen Reactions   Valsartan Swelling   Nifedipine Hives and Itching    ROS CONSTITUTIONAL: Negative for chills, fatigue, fever, unintentional weight gain and unintentional weight loss.  E/N/T: Negative for ear pain, nasal congestion and sore throat.  CARDIOVASCULAR: Negative for chest pain, dizziness, palpitations and pedal edema.  RESPIRATORY: Negative for recent cough and dyspnea.  GASTROINTESTINAL: Negative for abdominal pain, acid reflux symptoms, constipation, diarrhea, nausea and vomiting.  PSYCHIATRIC: Negative for sleep disturbance and to question depression screen.  Negative for depression, negative for anhedonia.        Objective:    PHYSICAL EXAM:   VS: BP (!) 140/86   Pulse 78   Temp (!) 96.8 F (36 C)   Resp 18   Ht '5\' 7"'$  (1.702 m)   Wt 270 lb (122.5 kg)   BMI 42.29 kg/m   GEN: Well nourished, well developed, in no acute distress  Cardiac: RRR; no murmurs,  Respiratory:  normal respiratory rate and pattern with no distress - normal breath sounds with no rales, rhonchi, wheezes or rubs Skin: warm and dry, no rash  Psych: euthymic mood, appropriate affect and demeanor  BP (!) 140/86   Pulse 78   Temp (!) 96.8 F (36 C)   Resp 18   Ht '5\' 7"'$  (1.702 m)   Wt 270 lb (122.5 kg)   BMI 42.29 kg/m  Wt Readings from Last 3 Encounters:  12/10/22 270 lb (122.5 kg)  11/11/22 269 lb 11.2 oz (122.3 kg)  10/25/22 268 lb 9.6 oz (121.8 kg)     There are no preventive care reminders to display for this patient.  There are no preventive care  reminders to display for this patient.  Lab Results  Component Value Date   TSH 2.330 02/25/2022   Lab Results  Component Value Date   WBC 6.0 08/31/2022   HGB 13.1 08/31/2022   HCT 40.5 08/31/2022   MCV 79 08/31/2022   PLT 277 08/31/2022   Lab Results  Component Value Date   NA 140 08/31/2022   K 3.3 (L) 08/31/2022   CO2 26 08/31/2022   GLUCOSE 95 08/31/2022   BUN 14 08/31/2022   CREATININE 1.15 (H) 08/31/2022   BILITOT 0.4 08/31/2022   ALKPHOS 58 08/31/2022   AST 28 08/31/2022   ALT 40 (H) 08/31/2022   PROT 6.4 08/31/2022   ALBUMIN 4.5 08/31/2022   CALCIUM 9.7 08/31/2022   EGFR 60 08/31/2022   GFR 67.41 11/06/2018   Lab Results  Component Value Date   CHOL 168 08/31/2022   Lab Results  Component Value Date   HDL 60 08/31/2022   Lab Results  Component Value Date   LDLCALC 93 08/31/2022   Lab Results  Component Value Date   TRIG 80 08/31/2022   Lab Results  Component Value Date   CHOLHDL 2.8 08/31/2022   Lab Results  Component Value Date   HGBA1C 5.6 01/13/2022      Assessment & Plan:   Problem List Items Addressed This Visit       Respiratory   Allergic rhinitis   Other Visit Diagnoses     Essential hypertension    -  Primary   Hypokalemia  Relevant Orders   Comprehensive metabolic panel Continue current meds and follow up with hypertensive clinic   Uncontrolled hypertension       Relevant Orders   CBC with Differential/Platelet   Comprehensive metabolic panel   TSH   Lipid panel Continue current meds and follow with hypertensive clinic   Vitamin D deficiency       Relevant Orders   VITAMIN D 25 Hydroxy (Vit-D Deficiency, Fractures)   Impaired fasting glucose       Relevant Orders   Hemoglobin A1c       No orders of the defined types were placed in this encounter.   Follow-up: Return in about 4 months (around 04/11/2023) for chronic fasting follow up.    SARA R Vernon Ariel, PA-C

## 2022-12-11 LAB — COMPREHENSIVE METABOLIC PANEL
ALT: 48 IU/L — ABNORMAL HIGH (ref 0–32)
AST: 29 IU/L (ref 0–40)
Albumin/Globulin Ratio: 1.7 (ref 1.2–2.2)
Albumin: 4.2 g/dL (ref 3.9–4.9)
Alkaline Phosphatase: 59 IU/L (ref 44–121)
BUN/Creatinine Ratio: 10 (ref 9–23)
BUN: 12 mg/dL (ref 6–24)
Bilirubin Total: 0.4 mg/dL (ref 0.0–1.2)
CO2: 25 mmol/L (ref 20–29)
Calcium: 9.4 mg/dL (ref 8.7–10.2)
Chloride: 100 mmol/L (ref 96–106)
Creatinine, Ser: 1.15 mg/dL — ABNORMAL HIGH (ref 0.57–1.00)
Globulin, Total: 2.5 g/dL (ref 1.5–4.5)
Glucose: 103 mg/dL — ABNORMAL HIGH (ref 70–99)
Potassium: 3.1 mmol/L — ABNORMAL LOW (ref 3.5–5.2)
Sodium: 140 mmol/L (ref 134–144)
Total Protein: 6.7 g/dL (ref 6.0–8.5)
eGFR: 60 mL/min/{1.73_m2} (ref >59)

## 2022-12-11 LAB — CBC WITH DIFFERENTIAL/PLATELET
Basophils Absolute: 0.1 10*3/uL (ref 0.0–0.2)
Basos: 1 %
EOS (ABSOLUTE): 0.2 10*3/uL (ref 0.0–0.4)
Eos: 3 %
Hematocrit: 39.5 % (ref 34.0–46.6)
Hemoglobin: 13.2 g/dL (ref 11.1–15.9)
Immature Grans (Abs): 0 10*3/uL (ref 0.0–0.1)
Immature Granulocytes: 0 %
Lymphocytes Absolute: 1.9 10*3/uL (ref 0.7–3.1)
Lymphs: 28 %
MCH: 27.3 pg (ref 26.6–33.0)
MCHC: 33.4 g/dL (ref 31.5–35.7)
MCV: 82 fL (ref 79–97)
Monocytes Absolute: 0.6 10*3/uL (ref 0.1–0.9)
Monocytes: 9 %
Neutrophils Absolute: 4 10*3/uL (ref 1.4–7.0)
Neutrophils: 59 %
Platelets: 246 10*3/uL (ref 150–450)
RBC: 4.84 x10E6/uL (ref 3.77–5.28)
RDW: 14.8 % (ref 11.7–15.4)
WBC: 6.7 10*3/uL (ref 3.4–10.8)

## 2022-12-11 LAB — LIPID PANEL
Chol/HDL Ratio: 2.8 ratio (ref 0.0–4.4)
Cholesterol, Total: 171 mg/dL (ref 100–199)
HDL: 61 mg/dL (ref >39)
LDL Chol Calc (NIH): 95 mg/dL (ref 0–99)
Triglycerides: 81 mg/dL (ref 0–149)
VLDL Cholesterol Cal: 15 mg/dL (ref 5–40)

## 2022-12-11 LAB — CARDIOVASCULAR RISK ASSESSMENT

## 2022-12-11 LAB — TSH: TSH: 2.17 u[IU]/mL (ref 0.450–4.500)

## 2022-12-11 LAB — HEMOGLOBIN A1C
Est. average glucose Bld gHb Est-mCnc: 117 mg/dL
Hgb A1c MFr Bld: 5.7 % — ABNORMAL HIGH (ref 4.8–5.6)

## 2022-12-11 LAB — VITAMIN D 25 HYDROXY (VIT D DEFICIENCY, FRACTURES): Vit D, 25-Hydroxy: 60.8 ng/mL (ref 30.0–100.0)

## 2022-12-14 ENCOUNTER — Other Ambulatory Visit: Payer: Self-pay | Admitting: Physician Assistant

## 2022-12-14 DIAGNOSIS — E876 Hypokalemia: Secondary | ICD-10-CM

## 2022-12-15 ENCOUNTER — Ambulatory Visit: Payer: BC Managed Care – PPO | Attending: Cardiovascular Disease

## 2022-12-15 DIAGNOSIS — Z0000 Encounter for general adult medical examination without abnormal findings: Secondary | ICD-10-CM

## 2022-12-15 NOTE — Progress Notes (Signed)
Appointment Outcome: Completed, Session #: [id number]                        Start time: 2:10pm   End time: 2:45pm   Total Mins: 35 minutes  AGREEMENTS SECTION   Overall Goal(s): Smoking cessation   Increase physical activity Improve healthy eating behaviors                                                Smoking Cessation Practice mini quits to only light two cigarettes per day Track time in between cigarettes Go without smoking for one day Smoke half a cigarette at a time When triggered to smoke implement positive self-talk Engage in activities to keep hands busy Read a book Call friend in the evening Take Wellbutrin as prescribed   Increase Physical Activity Exercise 3xs/week for 30 minutes between 8-9am Utilize walking/stepping exercise videos on YouTube   Improving Healthy Eating Habits Practice portion control Read food labels Aim for '1500mg'$  of sodium/day Meal planning Incorporate fruits and vegetables Go grocery shopping to stock up on healthier food choices Purchase protein shakes    Progress Notes:  Patient shared that her smoking habits is about the same. Patient mentioned that she does not smoke a whole cigarette at a time and find herself not smoking as often. Patient mentioned that she has been able to recently go without smoking from 10am on Sunday to 10pm on Monday (1.5 days). Patient shared that she decided not to take her cigarettes with her when she left home for the weekend.  Patient reported that it is getting easier talking herself out of smoking and does not have as much of a challenge with smoking late in the evenings. Patient stated that she has been calling a friend, reading, or playing a word search game to distract herself. Patient stated that by engaging in these activities, she can deter herself from smoking as often. Patient shared that she continues to take Wellbutrin as prescribed.   Patient stated that she has been able to engage in exercise  using YouTube walking videos twice a week and was able to reach three times a week for two weeks. Patient shared that she exercises for about 20-25 minutes per session. Patient stated that exercising makes her feel good and it is rewarding to look at the number of steps she walked in over the course of a mile or more. Patient mentioned that the wedding season is about to start, and she will be walking more during work. Patient shared that over the weekend she was able to walk around an [employer] gardens.   Patient explained that she had a learning experiencing from eating an [employer] that caused her to swell although she ate half the sub because she is practicing portion control. Patient stated that helped her to understand what foods are not as healthy of a choice due to its sodium content. Patient stated that these foods she will avoid or limit in her diet. Patient mentioned that she has been advised to eat foods high in potassium. Patient stated that she has increased her servings of leafy green vegetables, included sweet potatoes, and various salads over the past month. Patient stated that she has worked on increasing her fruit consumption as well. Patient shared that her go to fruits are  bananas, mandarin oranges, and grapes.   Patient stated that having food readily available has helped her stick to her healthy eating habits by meal planning. Patient shared that she continues to incorporate protein shakes as a meal replacement. Patient mentioned that her husband is holding her accountable to maintain her eating habits when she gets busy at work. Patient stated that she will take fruit, protein bars and shakes with her to work to aid in maintaining healthy eating habits and avoid not eating during the day. Patient expressed that she is being more mindful of what she is eating. Patient explained that reading food labels has made her more conscious of what she is eating and helping her make better  choices.     Indicators of Success and Accountability:  Patient has been able to increase her physical activity days and increase her fruit and vegetable consumption.  Readiness: Patient is in the action stage of smoking cessation, improving healthy eating habits, and increasing physical activity.   Strengths and Supports: Patient is being supported by a friend and her husband. Patient is relying in her increase in self-awareness around her health behaviors.   Challenges and Barriers: Patient is about to start planning weddings again, which may be a challenge to maintaining healthy eating habits.    Coaching Outcomes: Patient stated that over the next two months that she will work to smoke less. Patient feels that since she has taken the approach to reduce smoking over time that once she achieves smoking cessation it will be easier to maintain. Patient stated that being busy at work will minimize her smoking even more and it will be easier to quit smoking.   Patient will continue to implement all action steps as outlined over the next two months.    Attempted: Fulfilled - Patient was able to implement her action steps to work towards smoking cessation over the past month. Patient has been able to maintain her healthy eating habits.  Partial - Patient was able to exercise 3xs/week 50% of the time and has been able to reach a max of 25 minutes out of 30 minutes per session.

## 2022-12-23 ENCOUNTER — Other Ambulatory Visit: Payer: Self-pay

## 2022-12-23 DIAGNOSIS — F5101 Primary insomnia: Secondary | ICD-10-CM

## 2022-12-23 MED ORDER — ZOLPIDEM TARTRATE 10 MG PO TABS: ORAL_TABLET | ORAL | 1 refills | Status: DC

## 2022-12-28 ENCOUNTER — Other Ambulatory Visit: Payer: BC Managed Care – PPO

## 2022-12-28 DIAGNOSIS — E876 Hypokalemia: Secondary | ICD-10-CM

## 2022-12-28 LAB — COMPREHENSIVE METABOLIC PANEL
ALT: 50 IU/L — ABNORMAL HIGH (ref 0–32)
AST: 39 IU/L (ref 0–40)
Albumin/Globulin Ratio: 1.7 (ref 1.2–2.2)
Albumin: 4.2 g/dL (ref 3.9–4.9)
Alkaline Phosphatase: 53 IU/L (ref 44–121)
BUN/Creatinine Ratio: 12 (ref 9–23)
BUN: 13 mg/dL (ref 6–24)
Bilirubin Total: 0.4 mg/dL (ref 0.0–1.2)
CO2: 26 mmol/L (ref 20–29)
Calcium: 9.4 mg/dL (ref 8.7–10.2)
Chloride: 101 mmol/L (ref 96–106)
Creatinine, Ser: 1.11 mg/dL — ABNORMAL HIGH (ref 0.57–1.00)
Globulin, Total: 2.5 g/dL (ref 1.5–4.5)
Glucose: 105 mg/dL — ABNORMAL HIGH (ref 70–99)
Potassium: 3 mmol/L — ABNORMAL LOW (ref 3.5–5.2)
Sodium: 143 mmol/L (ref 134–144)
Total Protein: 6.7 g/dL (ref 6.0–8.5)
eGFR: 63 mL/min/{1.73_m2} (ref >59)

## 2022-12-29 ENCOUNTER — Other Ambulatory Visit: Payer: Self-pay | Admitting: Physician Assistant

## 2022-12-29 DIAGNOSIS — E876 Hypokalemia: Secondary | ICD-10-CM

## 2022-12-29 MED ORDER — POTASSIUM CHLORIDE CRYS ER 20 MEQ PO TBCR: 20.0000 meq | EXTENDED_RELEASE_TABLET | Freq: Two times a day (BID) | ORAL | 2 refills | Status: DC

## 2022-12-31 DIAGNOSIS — G4733 Obstructive sleep apnea (adult) (pediatric): Secondary | ICD-10-CM | POA: Diagnosis not present

## 2023-01-01 DIAGNOSIS — G4733 Obstructive sleep apnea (adult) (pediatric): Secondary | ICD-10-CM | POA: Diagnosis not present

## 2023-01-09 ENCOUNTER — Other Ambulatory Visit: Payer: Self-pay

## 2023-01-09 DIAGNOSIS — E876 Hypokalemia: Secondary | ICD-10-CM

## 2023-01-11 ENCOUNTER — Other Ambulatory Visit: Payer: BC Managed Care – PPO

## 2023-01-11 ENCOUNTER — Other Ambulatory Visit: Payer: Self-pay

## 2023-01-11 DIAGNOSIS — E876 Hypokalemia: Secondary | ICD-10-CM | POA: Diagnosis not present

## 2023-01-11 DIAGNOSIS — R21 Rash and other nonspecific skin eruption: Secondary | ICD-10-CM

## 2023-01-11 MED ORDER — FAMOTIDINE 20 MG PO TABS: 20.0000 mg | ORAL_TABLET | Freq: Two times a day (BID) | ORAL | 2 refills | Status: DC

## 2023-01-12 ENCOUNTER — Telehealth (HOSPITAL_BASED_OUTPATIENT_CLINIC_OR_DEPARTMENT_OTHER): Payer: Self-pay | Admitting: *Deleted

## 2023-01-12 LAB — COMPREHENSIVE METABOLIC PANEL
ALT: 47 IU/L — ABNORMAL HIGH (ref 0–32)
AST: 30 IU/L (ref 0–40)
Albumin/Globulin Ratio: 1.8 (ref 1.2–2.2)
Albumin: 4.2 g/dL (ref 3.9–4.9)
Alkaline Phosphatase: 52 IU/L (ref 44–121)
BUN/Creatinine Ratio: 12 (ref 9–23)
BUN: 12 mg/dL (ref 6–24)
Bilirubin Total: 0.4 mg/dL (ref 0.0–1.2)
CO2: 24 mmol/L (ref 20–29)
Calcium: 9.1 mg/dL (ref 8.7–10.2)
Chloride: 100 mmol/L (ref 96–106)
Creatinine, Ser: 1.03 mg/dL — ABNORMAL HIGH (ref 0.57–1.00)
Globulin, Total: 2.4 g/dL (ref 1.5–4.5)
Glucose: 100 mg/dL — ABNORMAL HIGH (ref 70–99)
Potassium: 3.1 mmol/L — ABNORMAL LOW (ref 3.5–5.2)
Sodium: 140 mmol/L (ref 134–144)
Total Protein: 6.6 g/dL (ref 6.0–8.5)
eGFR: 69 mL/min/{1.73_m2} (ref >59)

## 2023-01-12 NOTE — Telephone Encounter (Signed)
Patient returned [employer] blood pressure monitor  

## 2023-01-14 ENCOUNTER — Other Ambulatory Visit: Payer: Self-pay | Admitting: Physician Assistant

## 2023-01-14 DIAGNOSIS — E876 Hypokalemia: Secondary | ICD-10-CM

## 2023-01-28 ENCOUNTER — Ambulatory Visit: Payer: BC Managed Care – PPO | Admitting: Cardiology

## 2023-01-31 ENCOUNTER — Ambulatory Visit: Payer: BC Managed Care – PPO

## 2023-01-31 DIAGNOSIS — G4733 Obstructive sleep apnea (adult) (pediatric): Secondary | ICD-10-CM | POA: Diagnosis not present

## 2023-02-01 DIAGNOSIS — G4733 Obstructive sleep apnea (adult) (pediatric): Secondary | ICD-10-CM | POA: Diagnosis not present

## 2023-02-07 ENCOUNTER — Other Ambulatory Visit: Payer: Self-pay

## 2023-02-07 MED ORDER — CETIRIZINE HCL 10 MG PO TABS: 10.0000 mg | ORAL_TABLET | Freq: Every day | ORAL | 1 refills | Status: DC

## 2023-02-14 ENCOUNTER — Ambulatory Visit: Payer: BC Managed Care – PPO | Attending: Internal Medicine

## 2023-02-14 ENCOUNTER — Ambulatory Visit (HOSPITAL_BASED_OUTPATIENT_CLINIC_OR_DEPARTMENT_OTHER): Payer: BC Managed Care – PPO | Admitting: Cardiovascular Disease

## 2023-02-14 ENCOUNTER — Encounter (HOSPITAL_BASED_OUTPATIENT_CLINIC_OR_DEPARTMENT_OTHER): Payer: Self-pay | Admitting: Cardiovascular Disease

## 2023-02-14 ENCOUNTER — Ambulatory Visit: Payer: BC Managed Care – PPO

## 2023-02-14 VITALS — BP 134/92 | HR 56 | Ht 67.0 in | Wt 271.3 lb

## 2023-02-14 DIAGNOSIS — I1A0 Resistant hypertension: Secondary | ICD-10-CM

## 2023-02-14 DIAGNOSIS — E876 Hypokalemia: Secondary | ICD-10-CM

## 2023-02-14 DIAGNOSIS — Z5181 Encounter for therapeutic drug level monitoring: Secondary | ICD-10-CM

## 2023-02-14 DIAGNOSIS — Z0000 Encounter for general adult medical examination without abnormal findings: Secondary | ICD-10-CM

## 2023-02-14 DIAGNOSIS — F17209 Nicotine dependence, unspecified, with unspecified nicotine-induced disorders: Secondary | ICD-10-CM

## 2023-02-14 MED ORDER — SPIRONOLACTONE 25 MG PO TABS: 25.0000 mg | ORAL_TABLET | Freq: Every day | ORAL | 3 refills | Status: DC

## 2023-02-14 NOTE — Assessment & Plan Note (Signed)
Blood pressure remains uncontrolled.  Unfortunately, she was denied by her insurance for renal denervation.  They do not deny her eligibility is a candidate, they just do not pay for RDN.  She has struggled with hypokalemia.  We will stop her potassium supplementation and start spironolactone 25 mg daily.  Continue carvedilol, chlorthalidone, clonidine, and hydralazine.  Her blood pressures tend to run higher in the afternoon and evening than in the mornings.  We may need to increase her afternoon and evening hydralazine if blood pressures remain uncontrolled.  BP goal less than 130/80.  Encouraged her to work on increasing her exercise.

## 2023-02-14 NOTE — Progress Notes (Signed)
Cardiology Office Note:    Date:  02/14/2023   ID:  Amanda Barrett, DOB 1975-11-25, MRN [id number]  PCP:  Marianne Sofia, Cordelia Poche    [facility] Providers Cardiologist:  Chilton Si, MD     Referring MD: No ref. provider found   CC: Hypertension  History of Present Illness:    Amanda Barrett is a 47 y.o. female with a hx of hypertension, anemia, vitamin D deficiency, depression, tobacco use here for follow-up.  She first established care in [facility].  Amanda Barrett was diagnosed with hypertension when she was 47 years old. It has been previously controlled with medication. Then started ot have some gum problems with Amlodipine. This is gradually improving. Then 01/2022 developed hives including having to use epipen with ED visit 03/10/22 for angioedema. Her Nifedipine dose was increased - hives went away for a few days then as soon as Prednisone  dose completed has recurrent hives and Nifedipine discontinued. 04/2022 Clonidine initiated which took some time to adjust to. Concerned about interaction between Clonidine and Coreg with rebound hypertension.    She did take her medications 12 hours apart. She was taking her Hydralazine twice per day as four times per day was difficult to keep up with.  Noted about 2 hours after taking her medications begins to feel weak, tired.  Lower extremity edema only if has long periods of standing which had resolved with elevation.  She was seen by Gillian Shields, NP on 07/01/22 and reported elevated BP readings at home. She was started on hydralazine.  She followed up with out pharmacist 08/17/2022 and she noted that the combination of hydralazine and clonidine made her drowsy in the mornings for a few hours. Her HCTZ was switched to chlorthalidone 25 mg daily and her clonidine 0.2 mg was switched to patches.   At her visit 10/2022, blood pressures in [facility] had ranged from 110s to 160s systolic.  Her blood pressure remained  uncontrolled on multiple agents, and no modifiable causes of secondary hypertension had been identified. She was felt to be an excellent candidate for renal denervation and was referred to Dr. Kirke Corin. Smoking cessation was encouraged. She was seen by Wallis Bamberg, NP 11/11/2022. Per [facility], her morning blood pressure was often in the 110s/60s-120s/80s and then in the evening 150s-170s/100s. They moved her midday dose of hydralazine to 6 pm for closer to 8 hours between each dose. Her [facility] blood pressure cuff was returned as of 01/2023.   Today, she is feeling okay. In clinic her blood pressure is 146/90, and 134/92 on manual recheck. More often she is taking two doses of hydralazine rather than three as she frequently forgets the midday dose. Since finishing the study she has fallen out of the habit of monitoring her BP at home. She is staying active and trying to exercise. She notes that this is not a full exercise but continues to work on increasing this. She denies any palpitations, chest pain, shortness of breath, or peripheral edema. No lightheadedness, headaches, syncope, orthopnea, or PND.  Previous antihypertensives: Valsartan - allergic reaction (swelling) 01/2022 Nifedipine - allergic reaction (hives, itching) trialed 09/2018, 09/2021, 11/2021 - eventually discontinued 03/26/22 Amlodipine - gingival hyperplasia   Past Medical History:  Diagnosis Date   Anemia    Pernicious   Angio-edema    Hypertension    Recurrent upper respiratory infection (URI)    Resistant hypertension 09/14/2018   Urticaria     Past Surgical History:  Procedure Laterality Date   ADENOIDECTOMY     MYOMECTOMY     Uterine Fibroids   SINOSCOPY     SINUS EXPLORATION     TONSILLECTOMY      Current Medications: Current Meds  Medication Sig   albuterol (PROAIR HFA) 108 (90 Base) MCG/ACT inhaler Inhale 2 puffs into the lungs every 4 (four) hours as needed for wheezing or shortness of breath.   buPROPion  (WELLBUTRIN SR) 150 MG 12 hr tablet Take 150 mg by mouth daily.   buPROPion (WELLBUTRIN XL) 300 MG 24 hr tablet TAKE 1 TABLET BY MOUTH EVERY DAY   carvedilol (COREG) 25 MG tablet TAKE 1 TABLET BY MOUTH TWICE A DAY WITH FOOD   cetirizine (ZYRTEC) 10 MG tablet Take 1 tablet (10 mg total) by mouth daily.   chlorthalidone (HYGROTON) 25 MG tablet Take 1 tablet (25 mg total) by mouth daily.   cloNIDine (CATAPRES - DOSED IN MG/24 HR) 0.3 mg/24hr patch Place 1 patch (0.3 mg total) onto the skin once a week.   EPINEPHrine 0.3 mg/0.3 mL IJ SOAJ injection Inject 0.3 mg into the muscle as needed for anaphylaxis.   famotidine (PEPCID) 20 MG tablet Take 1 tablet (20 mg total) by mouth 2 (two) times daily.   fluticasone (FLONASE) 50 MCG/ACT nasal spray Place 2 sprays into both nostrils daily.   hydrALAZINE (APRESOLINE) 50 MG tablet Take 1 tablet (50 mg total) by mouth 3 (three) times daily.   spironolactone (ALDACTONE) 25 MG tablet Take 1 tablet (25 mg total) by mouth daily.   triamcinolone cream (KENALOG) 0.1 % APPLY 1 APPLICATION. TOPICALLY 2 (TWO) TIMES DAILY.   Vitamin D, Ergocalciferol, (DRISDOL) 1.25 MG (50000 UNIT) CAPS capsule Take 1 capsule (50,000 Units total) by mouth every 7 (seven) days.   zolpidem (AMBIEN) 10 MG tablet 1 po qhs prn   [DISCONTINUED] potassium chloride SA (KLOR-CON M20) 20 MEQ tablet Take 1 tablet (20 mEq total) by mouth 2 (two) times daily.     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  Tobacco Use   Smoking status: Every Day    Packs/day: 0.25    Years: 20.00    Additional pack years: 0.00    Total pack years: 5.00    Types: Cigarettes    Passive exposure: Current   Smokeless  tobacco: Never  Vaping Use   Vaping Use: Never used  Substance and Sexual Activity   Alcohol use: No   Drug use: No   Sexual activity: Yes    Partners: Male    Birth control/protection: None  Other Topics Concern   Not on file  Social History Narrative   Not on file   Social Determinants of Health   Financial Resource Strain: Low Risk  (09/15/2021)   Overall Financial Resource Strain (CARDIA)    Difficulty of Paying Living Expenses: Not hard at all  Food Insecurity: No Food Insecurity (09/15/2021)   Hunger Vital Sign    Worried About Running Out of Food in the Last Year: Never true    Ran Out of Food in the Last Year: Never true  Transportation Needs: No Transportation Needs (09/15/2021)   PRAPARE -  Administrator, Civil Service (Medical): No    Lack of Transportation (Non-Medical): No  Physical Activity: Insufficiently Active (09/15/2021)   Exercise Vital Sign    Days of Exercise per Week: 2 days    Minutes of Exercise per Session: 20 min  Stress: No Stress Concern Present (09/15/2021)   [employer] of Occupational Health - Occupational Stress Questionnaire    Feeling of Stress : Not at all  Social Connections: Moderately Isolated (09/15/2021)   Social Connection and Isolation Panel [NHANES]    Frequency of Communication with Friends and Family: More than three times a week    Frequency of Social Gatherings with Friends and Family: Three times a week    Attends Religious Services: Never    Active Member of Clubs or Organizations: No    Attends Engineer, structural: Never    Marital Status: Married     Family History: The patient's family history includes Cancer in her maternal grandmother and paternal grandfather; Diabetes in her father, maternal grandfather, maternal grandmother, mother, and paternal grandmother; Heart disease in her maternal grandmother and mother; Hypertension in her father, maternal grandmother, mother, paternal grandfather,  and paternal grandmother; Hypotension in her maternal grandfather; Kidney disease in her maternal grandmother; Other in her maternal grandfather; Stroke in her mother.  ROS:   Please see the history of present illness.    All other systems reviewed and are negative.  EKGs/Labs/Other Studies Reviewed:    The following studies were reviewed today:  12/18/2018 CT Angio/abdm/pelvis IMPRESSION: VASCULAR 1. No evidence of renal artery stenosis or other lesion to suggest a renovascular component of hypertension.   NON-VASCULAR:. 1. Fatty liver. 2. Scattered descending and sigmoid diverticula. 3. Degenerative disc disease L4-S1.  11/09/2018  Renal Artery Doppler Summary:  Possible bilateral renal artery stenosis but accuracy of study limited by  vessel tortuosity. Consideration of CT Angiogram or contrast angiogram if pt renal function  is reasonable and still high index of suspicion.      EKG:  EKG is personally reviewed. 02/14/2023:  EKG was not ordered. 10/11/2022: EKG was not ordered. 07/01/2022: NSR 68 bpm, no acute ST/T wave changes.    Recent Labs: 12/10/2022: Hemoglobin 13.2; Platelets 246; TSH 2.170 01/11/2023: ALT 47; BUN 12; Creatinine, Ser 1.03; Potassium 3.1; Sodium 140   Recent Lipid Panel    Component Value Date/Time   CHOL 171 12/10/2022 1047   TRIG 81 12/10/2022 1047   HDL 61 12/10/2022 1047   CHOLHDL 2.8 12/10/2022 1047   CHOLHDL 3 10/18/2018 1027   VLDL 14.0 10/18/2018 1027   LDLCALC 95 12/10/2022 1047     Risk Assessment/Calculations:      Physical Exam:    Wt Readings from Last 3 Encounters:  02/14/23 271 lb 4.8 oz (123.1 kg)  12/10/22 270 lb (122.5 kg)  11/11/22 269 lb 11.2 oz (122.3 kg)    VS:  BP (!) 134/92 (BP Location: Right Arm, Patient Position: Sitting, Cuff Size: Large)   Pulse (!) 56   Ht 5\' 7"  (1.702 m)   Wt 271 lb 4.8 oz (123.1 kg)   SpO2 97%   BMI 42.49 kg/m  , BMI Body mass index is 42.49 kg/m. GENERAL:  Well appearing HEENT: Pupils  equal round and reactive, fundi not visualized, oral mucosa unremarkable NECK:  No jugular venous distention, waveform within normal limits, carotid upstroke brisk and symmetric, no bruits, no thyromegaly LUNGS:  Clear to auscultation bilaterally HEART:  RRR.  PMI not displaced or  sustained,S1 and S2 within normal limits, no S3, no S4, no clicks, no rubs, no murmurs ABD:  Flat, positive bowel sounds normal in frequency in pitch, no bruits, no rebound, no guarding, no midline pulsatile mass, no hepatomegaly, no splenomegaly EXT:  2 plus pulses throughout, no edema, no cyanosis no clubbing SKIN:  No rashes no nodules NEURO:  Cranial nerves II through XII grossly intact, motor grossly intact throughout PSYCH:  Cognitively intact, oriented to person place and time   ASSESSMENT:    1. Resistant hypertension   2. Tobacco use disorder, continuous   3. Therapeutic drug monitoring     PLAN:    Resistant hypertension Blood pressure remains uncontrolled.  Unfortunately, she was denied by her insurance for renal denervation.  They do not deny her eligibility is a candidate, they just do not pay for RDN.  She has struggled with hypokalemia.  We will stop her potassium supplementation and start spironolactone 25 mg daily.  Continue carvedilol, chlorthalidone, clonidine, and hydralazine.  Her blood pressures tend to run higher in the afternoon and evening than in the mornings.  We may need to increase her afternoon and evening hydralazine if blood pressures remain uncontrolled.  BP goal less than 130/80.  Encouraged her to work on increasing her exercise.  Tobacco use disorder, continuous Continue to encourage her to abstain from smoking.   Screening for Secondary Hypertension:      07/01/2022    1:31 PM  Causes  Drugs/Herbals Screened  Renovascular HTN Screened     - Comments 12/2018 negative CT  Sleep Apnea Screened     - Comments 06/2022 [facility] sleep study  Thyroid Disease Screened     -  Comments 02/2022 normal TSH  Hyperaldosteronism Screened     - Comments 11/2018 normal renin aldosterone  Pheochromocytoma Screened     - Comments 06/2022 labs ordered  Cushing's Syndrome Screened     - Comments 06/2022 labs ordered  Coarctation of the Aorta Not Screened  Compliance Screened     - Comments 06/2022 Hydralaine BID instead of QID    Relevant Labs/Studies:    Latest Ref Rng & Units 01/11/2023   10:00 AM 12/28/2022    9:15 AM 12/10/2022   10:47 AM  Basic Labs  Sodium 134 - 144 mmol/L 140  143  140   Potassium 3.5 - 5.2 mmol/L 3.1  3.0  3.1   Creatinine 0.57 - 1.00 mg/dL 1.61  0.96  0.45        Latest Ref Rng & Units 12/10/2022   10:47 AM 02/25/2022    4:38 PM  Thyroid   TSH 0.450 - 4.500 uIU/mL 2.170  2.330          Latest Ref Rng & Units 08/18/2022   10:57 AM  Metanephrines/Catecholamines   Epinephrine 0 - 62 pg/mL 18   Norepinephrine 0 - 874 pg/mL 266   Dopamine 0 - 48 pg/mL <30   Metanephrines 0.0 - 88.0 pg/mL <25.0   Normetanephrines  0.0 - 218.9 pg/mL 36.0           11/09/2018    9:18 AM  Renovascular   Renal Artery [place] Completed Yes     Disposition:  FU with Archita Lomeli C. Duke Salvia, MD, [facility] in 2 months.  Medication Adjustments/Labs and Tests Ordered: Current medicines are reviewed at length with the patient today.  Concerns regarding medicines are outlined above.   Orders Placed This Encounter  Procedures   Basic metabolic panel   Meds  ordered this encounter  Medications   spironolactone (ALDACTONE) 25 MG tablet    Sig: Take 1 tablet (25 mg total) by mouth daily.    Dispense:  90 tablet    Refill:  3    D/C POTASSIUM   Patient Instructions  Medication Instructions:  STOP POTASSIUM   START SPIRONOLACTONE 25 MG DAILY   Labwork: BMET IN 1 WEEK  Testing/Procedures: NONE  Follow-Up: 04/21/2023 2:15 pm with Dr Duke Salvia   Any Other Special Instructions Will Be Listed Below (If Applicable). MONITOR YOUR BLOOD PRESSURE AND BRING YOUR READINGS TO  YOUR FOLLOW UP APPOINTMENT   If you need a refill on your cardiac medications before your next appointment, please call your pharmacy.    I,Mathew Stumpf,acting as a Neurosurgeon for Chilton Si, MD.,have documented all relevant documentation on the behalf of Chilton Si, MD,as directed by  Chilton Si, MD while in the presence of Chilton Si, MD.  I, Angela Vazguez C. Duke Salvia, MD have reviewed all documentation for this visit.  The documentation of the exam, diagnosis, procedures, and orders on 02/14/2023 are all accurate and complete.  Signed, Chilton Si, MD  02/14/2023 4:21 PM    [facility]

## 2023-02-14 NOTE — Patient Instructions (Addendum)
Medication Instructions:  STOP POTASSIUM   START SPIRONOLACTONE 25 MG DAILY   Labwork: BMET IN 1 WEEK  Testing/Procedures: NONE  Follow-Up: 04/21/2023 2:15 pm with Dr Duke Salvia   Any Other Special Instructions Will Be Listed Below (If Applicable). MONITOR YOUR BLOOD PRESSURE AND BRING YOUR READINGS TO YOUR FOLLOW UP APPOINTMENT   If you need a refill on your cardiac medications before your next appointment, please call your pharmacy.

## 2023-02-14 NOTE — Progress Notes (Signed)
Appointment Outcome: Completed, Session #: 65-month f/u                         Start time: 3:09pm  End time: 3:45pm  Total Mins: 36 minutes  AGREEMENTS SECTION   Overall Goal(s): Smoking cessation   Increase physical activity Improve healthy eating behaviors                                                Smoking Cessation Practice mini quits to only light two cigarettes per day Track time in between cigarettes Go without smoking for one day Smoke half a cigarette at a time When triggered to smoke implement positive self-talk Engage in activities to keep hands busy Read a book Call friend in the evening Take Wellbutrin as prescribed  Increase Physical Activity Exercise 3xs/week for 30 minutes between 8-9am Utilize walking/stepping exercise videos on YouTube   Improving Healthy Eating Habits Practice portion control Read food labels Aim for 1500mg  of sodium/day Meal planning Incorporate fruits and vegetables Go grocery shopping to stock up on healthier food choices Purchase protein shakes.    Progress Notes:  Patient reported that she smokes 2-3 cigarettes per day but not the entire cigarette. Patient shared that she smokes half or less of the cigarette at one time, which occurs mainly in the evenings. Patient stated that this is due to her feeling the need to unwind and relax while watching tv or playing with her dogs. Patient mentioned that she can talk herself out of smoking before bed, but not at other times. Patient stated that she does not feel the urge to smoke every morning after waking up.   Patient shared that there have been times that she hasn't smoked in a week due to traveling for work since she does not take cigarettes with her. Patient stated that she can refrain from smoking longer when she is busy with work. Patient mentioned that her evening smoking is sometimes related to stress from work. Patient shared that she distracts herself from smoking by reading.  Patient expressed that her goal over the next three months is to reduce her cigarette smoking to 1-2 cigarettes per day. Patient feels that her barrier to quitting currently is more mental, thinking that she needs a cigarette to help her relax.   Patient mentioned that her work schedule has picked up and it has been a challenge to maintain an exercise routine at home. 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 Patient mentioned that working out in the evening would not work because her body is at its max. Patient shared that she walks 4-5 miles on the weekends during small wedding events and sometimes she has two weddings on the same day. Patient mentioned that at larger weddings she easily walks 8-9 miles. Patient feels that she is still physically active but would like to work on exercising at home.  Patient stated that she is eating more fruits and vegetables and have been able to maintain that habit daily. Patient stated that she worked on time management to build in a time for lunch when at work. Patient stated that she has eaten [place] food at lunch with those she works with. Patient mentioned that she has been doing better with  eating on a schedule. Patient stated that she has remained conscious of the foods that she eats and reading food labels has helped her make better choices.   Patient mentioned that if she eats at [employer], she will get a kale salad instead of fries. Patient continues to incorporate low sodium seasonings. Patient stated that being mindful of the sodium that she consumes has helped with keeping her blood pressure lower than it was before. Patient shared that she continues to practice portion control and take the principles from [facility] when practicing portion control by using smaller containers to eat out of.    Indicators of Success and Accountability:  Patient stated that not  smoking more than what she does now that the wedding season has begun is her indicator of success and accountability.   Readiness: Patient is in action stage of improving healthy eating behaviors, increasing physical activity, and smoking cessation.  Strengths and Supports: Patient is being supported by her husband. Patient is relying on time management and an increase in self-aware around her eating behaviors.   Challenges and Barriers: Patient's work schedule may be a Chief Operating Officer to implementing her action steps.    Coaching Outcomes: Discussed with patient strategies that she could incorporate physical activity when working from home or on her days off. Patient is interested in exercising at home at least twice a week for 30 minutes. Patient plans to break up her walking exercise video from YouTube in 10-minute increments when she is working on the computer.   Patient stated that she wants to reduce her smoking to 1-2 cigarettes over the next three months.   Discussed with patient about trying the [facility] program again if she found it to be helpful during her previous use. Patient does not want to invest in the products at this time and stated that she will continue to use their principles for practicing portion control.   Patient mentioned that she will work on meal planning to aid in maintaining her healthy eating at home and at work.   Patient will implement the following action steps over the next three months as outlined below:  Smoking Cessation Practice mini quits to only light 1-2 cigarettes per day Track time in between cigarettes Go without smoking for one day to one week Smoke half a cigarette at a time When triggered to smoke implement positive self-talk Engage in activities to keep hands busy Read a book Call friend in the evening Take Wellbutrin as prescribed  Increase Physical Activity Exercise 2xs/week for 30 minutes between while working on the computer at  home Break down 30-minute exercise into 10-minute increments.  Utilize walking/stepping exercise videos on YouTube   Improving Healthy Eating Habits Practice portion control Read food labels Aim for 1500mg  of sodium/day Meal planning and meal prepping Incorporate fruits and vegetables   Attempted: Fulfilled - Patient continues to implement tracking time in between cigarettes, go without smoking for one day, smoke half a cigarette at a time, take Wellbutrin as prescribed, implement positive self-talk, and reading to reduce her smoking. Patient continues to implement her action steps to improve healthy eating habits.   Partial - Patient has been able to smoke two cigarettes per day, but on more stressful days she would smoke three cigarettes.   Not met - Patient has not been able to maintain exercising 3xs/week for 30 minutes between 8-9am utilizing an exercise video from YouTube.

## 2023-02-14 NOTE — Assessment & Plan Note (Signed)
Continue to encourage her to abstain from smoking.

## 2023-02-15 DIAGNOSIS — N925 Other specified irregular menstruation: Secondary | ICD-10-CM | POA: Diagnosis not present

## 2023-02-15 DIAGNOSIS — Z1389 Encounter for screening for other disorder: Secondary | ICD-10-CM | POA: Diagnosis not present

## 2023-02-15 DIAGNOSIS — Z130 Encounter for screening for diseases of the blood and blood-forming organs and certain disorders involving the immune mechanism: Secondary | ICD-10-CM | POA: Diagnosis not present

## 2023-02-15 DIAGNOSIS — Z01419 Encounter for gynecological examination (general) (routine) without abnormal findings: Secondary | ICD-10-CM | POA: Diagnosis not present

## 2023-02-15 LAB — COMPREHENSIVE METABOLIC PANEL
ALT: 45 IU/L — ABNORMAL HIGH (ref 0–32)
AST: 33 IU/L (ref 0–40)
Albumin/Globulin Ratio: 1.8 (ref 1.2–2.2)
Albumin: 4 g/dL (ref 3.9–4.9)
Alkaline Phosphatase: 49 IU/L (ref 44–121)
BUN/Creatinine Ratio: 10 (ref 9–23)
BUN: 11 mg/dL (ref 6–24)
Bilirubin Total: 0.3 mg/dL (ref 0.0–1.2)
CO2: 24 mmol/L (ref 20–29)
Calcium: 9 mg/dL (ref 8.7–10.2)
Chloride: 103 mmol/L (ref 96–106)
Creatinine, Ser: 1.06 mg/dL — ABNORMAL HIGH (ref 0.57–1.00)
Globulin, Total: 2.2 g/dL (ref 1.5–4.5)
Glucose: 135 mg/dL — ABNORMAL HIGH (ref 70–99)
Potassium: 3 mmol/L — ABNORMAL LOW (ref 3.5–5.2)
Sodium: 141 mmol/L (ref 134–144)
Total Protein: 6.2 g/dL (ref 6.0–8.5)
eGFR: 66 mL/min/{1.73_m2} (ref >59)

## 2023-02-22 ENCOUNTER — Other Ambulatory Visit: Payer: Self-pay | Admitting: Physician Assistant

## 2023-02-22 DIAGNOSIS — F5101 Primary insomnia: Secondary | ICD-10-CM

## 2023-02-23 ENCOUNTER — Ambulatory Visit: Payer: BC Managed Care – PPO | Admitting: Internal Medicine

## 2023-02-24 DIAGNOSIS — Z5181 Encounter for therapeutic drug level monitoring: Secondary | ICD-10-CM | POA: Diagnosis not present

## 2023-02-24 DIAGNOSIS — I1A0 Resistant hypertension: Secondary | ICD-10-CM | POA: Diagnosis not present

## 2023-02-25 LAB — BASIC METABOLIC PANEL
BUN/Creatinine Ratio: 12 (ref 9–23)
BUN: 15 mg/dL (ref 6–24)
CO2: 26 mmol/L (ref 20–29)
Calcium: 9.5 mg/dL (ref 8.7–10.2)
Chloride: 100 mmol/L (ref 96–106)
Creatinine, Ser: 1.22 mg/dL — ABNORMAL HIGH (ref 0.57–1.00)
Glucose: 136 mg/dL — ABNORMAL HIGH (ref 70–99)
Potassium: 3.7 mmol/L (ref 3.5–5.2)
Sodium: 141 mmol/L (ref 134–144)
eGFR: 56 mL/min/{1.73_m2} — ABNORMAL LOW (ref >59)

## 2023-03-02 DIAGNOSIS — G4733 Obstructive sleep apnea (adult) (pediatric): Secondary | ICD-10-CM | POA: Diagnosis not present

## 2023-03-03 ENCOUNTER — Other Ambulatory Visit: Payer: Self-pay | Admitting: Physician Assistant

## 2023-03-03 DIAGNOSIS — R21 Rash and other nonspecific skin eruption: Secondary | ICD-10-CM

## 2023-03-03 DIAGNOSIS — G4733 Obstructive sleep apnea (adult) (pediatric): Secondary | ICD-10-CM | POA: Diagnosis not present

## 2023-03-07 ENCOUNTER — Telehealth (HOSPITAL_BASED_OUTPATIENT_CLINIC_OR_DEPARTMENT_OTHER): Payer: Self-pay

## 2023-03-07 DIAGNOSIS — I10 Essential (primary) hypertension: Secondary | ICD-10-CM

## 2023-03-07 NOTE — Telephone Encounter (Addendum)
Seen by patient Amanda Barrett on 03/06/2023  7:45 PM, follow up mychart message sent to patient, labs ordered and mailed.   ----- Message from Alver Sorrow, NP sent at 03/06/2023  7:30 PM EDT ----- Normal potassium. Kidney function decreased from previous. Ensure staying well hydrated. Repeat BMP in 2-3 weeks for monitoring of kidney function.

## 2023-03-14 ENCOUNTER — Telehealth: Payer: Self-pay | Admitting: *Deleted

## 2023-03-24 ENCOUNTER — Ambulatory Visit: Payer: BC Managed Care – PPO | Admitting: Cardiology

## 2023-03-27 ENCOUNTER — Other Ambulatory Visit: Payer: Self-pay | Admitting: Physician Assistant

## 2023-03-27 DIAGNOSIS — E876 Hypokalemia: Secondary | ICD-10-CM

## 2023-03-28 ENCOUNTER — Other Ambulatory Visit: Payer: Self-pay

## 2023-03-28 DIAGNOSIS — I10 Essential (primary) hypertension: Secondary | ICD-10-CM

## 2023-03-28 MED ORDER — CARVEDILOL 25 MG PO TABS
25.0000 mg | ORAL_TABLET | Freq: Two times a day (BID) | ORAL | 0 refills | Status: DC
Start: 2023-03-28 — End: 2023-05-03

## 2023-04-03 DIAGNOSIS — G4733 Obstructive sleep apnea (adult) (pediatric): Secondary | ICD-10-CM | POA: Diagnosis not present

## 2023-04-08 ENCOUNTER — Other Ambulatory Visit (HOSPITAL_BASED_OUTPATIENT_CLINIC_OR_DEPARTMENT_OTHER): Payer: Self-pay | Admitting: Family

## 2023-04-08 DIAGNOSIS — I1A0 Resistant hypertension: Secondary | ICD-10-CM

## 2023-04-09 ENCOUNTER — Other Ambulatory Visit: Payer: Self-pay | Admitting: Physician Assistant

## 2023-04-21 ENCOUNTER — Ambulatory Visit (HOSPITAL_BASED_OUTPATIENT_CLINIC_OR_DEPARTMENT_OTHER): Payer: BC Managed Care – PPO | Admitting: Cardiovascular Disease

## 2023-04-22 ENCOUNTER — Ambulatory Visit: Payer: BC Managed Care – PPO | Admitting: Physician Assistant

## 2023-04-23 ENCOUNTER — Other Ambulatory Visit: Payer: Self-pay | Admitting: Physician Assistant

## 2023-04-23 DIAGNOSIS — F5101 Primary insomnia: Secondary | ICD-10-CM

## 2023-04-26 ENCOUNTER — Other Ambulatory Visit: Payer: Self-pay

## 2023-04-26 ENCOUNTER — Encounter: Payer: Self-pay | Admitting: Physician Assistant

## 2023-04-26 DIAGNOSIS — F5101 Primary insomnia: Secondary | ICD-10-CM

## 2023-04-26 MED ORDER — ZOLPIDEM TARTRATE 10 MG PO TABS
ORAL_TABLET | ORAL | 1 refills | Status: DC
Start: 2023-04-26 — End: 2023-06-27

## 2023-05-03 ENCOUNTER — Other Ambulatory Visit: Payer: Self-pay | Admitting: Physician Assistant

## 2023-05-03 DIAGNOSIS — I10 Essential (primary) hypertension: Secondary | ICD-10-CM

## 2023-05-12 ENCOUNTER — Other Ambulatory Visit: Payer: Self-pay

## 2023-05-13 MED ORDER — BUPROPION HCL ER (SR) 150 MG PO TB12: 150.0000 mg | ORAL_TABLET | Freq: Every day | ORAL | 1 refills | Status: DC

## 2023-05-16 ENCOUNTER — Ambulatory Visit: Payer: BC Managed Care – PPO | Attending: Cardiology

## 2023-05-16 DIAGNOSIS — Z0000 Encounter for general adult medical examination without abnormal findings: Secondary | ICD-10-CM

## 2023-05-16 NOTE — Progress Notes (Unsigned)
Appointment Outcome: Completed, Session #: 31-month f/u (Final)                         Start time: 3:04pm   End time: 3:32pm   Total Mins: 28 minutes  AGREEMENTS SECTION   Overall Goal(s): Smoking cessation Increase physical activity Improve healthy eating habits   Smoking Cessation Practice mini quits to only light 1-2 cigarettes per day Track time in between cigarettes Go without smoking for one day to one week Smoke half a cigarette at a time When triggered to smoke implement positive self-talk Engage in activities to keep hands busy Read a book Call friend in the evening Take Wellbutrin as prescribed   Increase Physical Activity Exercise 2xs/week for 30 minutes between while working on the computer at home Break down 30-minute exercise into 10-minute increments.  Utilize walking/stepping exercise videos on YouTube   Improving Healthy Eating Habits Practice portion control Read food labels Aim for 1500mg  of sodium/day Meal planning and meal prepping Incorporate fruits and vegetables    Progress Notes:  Patient reported that she currently smokes 3-4 cigarettes per day. Patient shared that she feels that the progress that she has made with reducing her smoking over the course of participating in health coaching is a success for her. Patient stated that if she were to stop smoking, she is afraid that it is going to be replaced by another habit. Patient stated that there are some days when she doesn't smoke.   Patient mentioned that there are times when she tries to talk herself out of smoking by asking herself if she really wants to smoke or assess if she is triggered because she is bored. Patient expressed that later in the day when she is trying to unwind, she craves smoking more because she is amped-up. Patient stated that she tries to read a book on her phone and watch tv as distractions. Patient mentioned that she has continued taking Wellbutrin and it helps with not  smoking more.   Patient shared that she has gotten away from exercising. 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 Patient stated that she has days when planning/prepping for events that she walks 3 or more miles, but this is not consistent daily. Patient expressed that she has improved with eating while working by having healthy snacks and hydrating drinks for those she collaborates with. Patient stated that her challenge with eating is when she is alone.   Patient mentioned that she has gotten away from meal planning and prepping and keeps a mental plan of what she wants to eat during the week for dinner. Patient shared that she eyeballs her portion sizes, although she reads food labels to monitor her daily sodium intake. Patient explained that she minimizes the amount of food that she prepares at a time to not encourage overeating. Patient stated that she continues to incorporate fruits/vegetables in her diet regularly in addition to drinking protein shakes 3 days per week.   Patient explained that additional challenges that she faces is having scheduled mealtimes. Patient shared that she tries to refrain from eating a lot of processed/frozen foods that are high in sodium. Patient mentioned that there are days when she may consume a lot of sodium when she chooses to eat frozen pizza or hamburger patties.   Discussed with patient what would help her improve her eating habits when  alone. Patient mentioned that having food available ahead of time would help her to stay on track. Patient expressed that the process of health coaching has made her more self-aware of her eating behaviors and aid in making the adjustments that she has made with eating healthier, reducing smoking, and being mindful to move more.     Indicators of Success and Accountability:  Patient feels that reducing her smoking to 3-4 cigarettes per  day is an indicator of success for her currently.   Readiness: Patient is in the relapse stage of smoking cessation and increasing physical activity. Patient is in the action stage of improving healthy eating.   Strengths and Supports: Patient stated that she is more self-aware of her health behaviors and is relying on that to assist her in making further health behavior changes.   Challenges and Barriers: Patient not having access to readily available foods when alone.   Coaching Outcomes: Discussed with patient if there were additional health behaviors she would work on in the future. Patient stated that she will continue to work on her goals outlined above by continuing to implement the steps she has in place now.   Patient stated that she will work to maintain not increasing her smoking past 3-4 cigarettes per day and being more conscious of her health behaviors overall.   Patient shared that having easy to prepare foods at home when she is alone would be helpful in maintaining a structure around meal planning and eating.   Patient plans to continue walking at work to maintain being physically active.  Patient completed her last health coaching follow-up appointment and has been completed out of the program.    Attempted:  Fulfilled - Patient took Wellbutrin as prescribed. Patient practiced portion control by reducing amount of food prepared. Patient did engage in different activities when triggered to smoke such as reading.   Partial - Patient stated that she would refrain from smoking one day approximately every two weeks. Patient stated that she has continued to read food labels to monitor salt intake, exceeds 1,500mg  of sodium depending on what she eats.   Not met - Patient did not reduce her smoking to 1-2 cigarettes over the past two weeks. Patient did not incorporate exercise as planned over the past three months.

## 2023-05-17 ENCOUNTER — Other Ambulatory Visit: Payer: Self-pay | Admitting: Physician Assistant

## 2023-05-17 DIAGNOSIS — F331 Major depressive disorder, recurrent, moderate: Secondary | ICD-10-CM

## 2023-05-24 ENCOUNTER — Telehealth: Payer: Self-pay

## 2023-05-24 NOTE — Telephone Encounter (Signed)
I spoke with the patient who stated that she is taking buPROPion (WELLBUTRIN SR) 150 MG 12 hr tablet in the PM and the buPROPion (WELLBUTRIN XL) 300 MG 24 hr tablet in the AM.

## 2023-05-24 NOTE — Telephone Encounter (Signed)
-----   Message from Blane Ohara sent at 05/17/2023 11:48 PM EDT ----- Regarding: wellbutrin xl Please call to clarify dose of wellbutrin xl she is taking. Dr. Sedalia Muta

## 2023-06-02 ENCOUNTER — Ambulatory Visit: Payer: BC Managed Care – PPO | Admitting: Physician Assistant

## 2023-06-02 VITALS — BP 140/82 | HR 72 | Temp 97.0°F | Ht 67.0 in | Wt 265.8 lb

## 2023-06-02 DIAGNOSIS — Z1211 Encounter for screening for malignant neoplasm of colon: Secondary | ICD-10-CM

## 2023-06-02 DIAGNOSIS — F331 Major depressive disorder, recurrent, moderate: Secondary | ICD-10-CM | POA: Diagnosis not present

## 2023-06-02 MED ORDER — BUPROPION HCL ER (XL) 300 MG PO TB24
300.0000 mg | ORAL_TABLET | Freq: Every morning | ORAL | Status: DC
Start: 2023-06-02 — End: 2023-08-19

## 2023-06-02 MED ORDER — BUPROPION HCL ER (SR) 150 MG PO TB12
150.0000 mg | ORAL_TABLET | Freq: Every evening | ORAL | Status: DC
Start: 2023-06-02 — End: 2023-12-07

## 2023-06-02 NOTE — Progress Notes (Signed)
Established Patient Office Visit  Subjective:  Patient ID: Amanda Barrett, female    DOB: [birth year]  Age: 46 y.o. MRN: [id number] PT NEW TO ME - Amanda Barrett PT CC:  Chief Complaint  Patient presents with   Medical Management of Chronic Issues    HPI Amanda Barrett presents for chronic follow up  Pt with history of hypertension.  She has had issues since age 55.  She currently is following with the blood pressure clinic in [place] because she has had uncontrolled hypertension that has been resistant to medications and pt also allergic to a few bp medications.   She has had her kidneys evaluated in the past.  She was not  approved for renal denervation Her current bp meds include coreg, chlorthalidone, clonidine, aldactone, and hydralazine She is also on potassium supplements  Pt with history of mild depression - states symptoms stable on wellbutrin XL 300mg  qd qam and Wellbutrin SR 150mg  q pm  She has history of allergic rhinitis and takes zyrtec, flonase and pepcid  Pt with history of insomnia - takes [place] as needed which works well for her  Pt with history of Vit D def - taking weekly supplement and due for labwork  Pt would like to schedule screening colonoscopy Past Medical History:  Diagnosis Date   Anemia    Pernicious   Angio-edema    Hypertension    Recurrent upper respiratory infection (URI)    Resistant hypertension 09/14/2018   Urticaria     Past Surgical History:  Procedure Laterality Date   ADENOIDECTOMY     MYOMECTOMY     Uterine Fibroids   SINOSCOPY     SINUS EXPLORATION     TONSILLECTOMY      Family History  Problem Relation Age of Onset   Stroke Mother    Hypertension Mother    Diabetes Mother        type 1   Heart disease Mother    Hypertension Father    Diabetes Father        Type 2   Cancer Maternal Grandmother        Ovarian   Heart disease Maternal Grandmother    Hypertension Maternal Grandmother    Diabetes Maternal Grandmother         Type 1   Kidney disease Maternal Grandmother    Diabetes Maternal Grandfather        Type 2   Other Maternal Grandfather        ITP   Hypotension Maternal Grandfather    Diabetes Paternal Grandmother        Type 2   Hypertension Paternal Grandmother    Cancer Paternal Grandfather        Bladder   Hypertension Paternal Grandfather     Social History   Socioeconomic History   Marital status: Married    Spouse name: Amanda Barrett Radio broadcast assistant   Number of children: Not on file   Years of education: Not on file   Highest education level: Bachelor's degree (e.g., BA, AB, BS)  Occupational History   Occupation: Runner, broadcasting/film/video    Comment: Owner  Tobacco Use   Smoking status: Every Day    Current packs/day: 0.25    Average packs/day: 0.3 packs/day for 20.0 years (5.0 ttl pk-yrs)    Types: Cigarettes    Passive exposure: Current   Smokeless tobacco: Never  Vaping Use   Vaping status: Never Used  Substance and Sexual Activity   Alcohol use:  No   Drug use: No   Sexual activity: Yes    Partners: Male    Birth control/protection: None  Other Topics Concern   Not on file  Social History Narrative   Not on file   Social Determinants of Health   Financial Resource Strain: Low Risk  (06/02/2023)   Overall Financial Resource Strain (CARDIA)    Difficulty of Paying Living Expenses: Not hard at all  Food Insecurity: No Food Insecurity (06/02/2023)   Hunger Vital Sign    Worried About Running Out of Food in the Last Year: Never true    Ran Out of Food in the Last Year: Never true  Transportation Needs: No Transportation Needs (06/02/2023)   PRAPARE - Administrator, Civil Service (Medical): No    Lack of Transportation (Non-Medical): No  Physical Activity: Insufficiently Active (06/02/2023)   Exercise Vital Sign    Days of Exercise per Week: 3 days    Minutes of Exercise per Session: 30 min  Stress: Stress Concern Present (06/02/2023)   [employer] of  Occupational Health - Occupational Stress Questionnaire    Feeling of Stress : To some extent  Social Connections: Moderately Integrated (06/02/2023)   Social Connection and Isolation Panel [NHANES]    Frequency of Communication with Friends and Family: More than three times a week    Frequency of Social Gatherings with Friends and Family: Three times a week    Attends Religious Services: 1 to 4 times per year    Active Member of Clubs or Organizations: No    Attends Banker Meetings: Not on file    Marital Status: Married  Intimate Partner Violence: Not At Risk (09/15/2021)   Humiliation, Afraid, Rape, and Kick questionnaire    Fear of Current or Ex-Partner: No    Emotionally Abused: No    Physically Abused: No    Sexually Abused: No     Current Outpatient Medications:    albuterol (PROAIR HFA) 108 (90 Base) MCG/ACT inhaler, Inhale 2 puffs into the lungs every 4 (four) hours as needed for wheezing or shortness of breath., Disp: 1 each, Rfl: 2   carvedilol (COREG) 25 MG tablet, TAKE 1 TABLET (25 MG TOTAL) BY MOUTH TWICE A DAY WITH MEALS, Disp: 60 tablet, Rfl: 0   cetirizine (ZYRTEC) 10 MG tablet, Take 1 tablet (10 mg total) by mouth daily., Disp: 90 tablet, Rfl: 1   chlorthalidone (HYGROTON) 25 MG tablet, Take 1 tablet (25 mg total) by mouth daily., Disp: 90 tablet, Rfl: 3   cloNIDine (CATAPRES - DOSED IN MG/24 HR) 0.3 mg/24hr patch, Place 1 patch (0.3 mg total) onto the skin once a week., Disp: 4 patch, Rfl: 12   EPINEPHrine 0.3 mg/0.3 mL IJ SOAJ injection, Inject 0.3 mg into the muscle as needed for anaphylaxis., Disp: 1 each, Rfl: 1   famotidine (PEPCID) 20 MG tablet, TAKE 1 TABLET BY MOUTH TWICE A DAY, Disp: 180 tablet, Rfl: 0   fluticasone (FLONASE) 50 MCG/ACT nasal spray, Place 2 sprays into both nostrils daily., Disp: 16 g, Rfl: 0   hydrALAZINE (APRESOLINE) 50 MG tablet, TAKE 1 TABLET (50 MG) BY MOUTH IN THE MORNING AND AT BEDTIME, Disp: 180 tablet, Rfl: 1    spironolactone (ALDACTONE) 25 MG tablet, Take 1 tablet (25 mg total) by mouth daily., Disp: 90 tablet, Rfl: 3   triamcinolone cream (KENALOG) 0.1 %, APPLY 1 APPLICATION. TOPICALLY 2 (TWO) TIMES DAILY., Disp: 30 g, Rfl: 0   Vitamin D,  Ergocalciferol, (DRISDOL) 1.25 MG (50000 UNIT) CAPS capsule, TAKE 1 CAPSULE (50,000 UNITS TOTAL) BY MOUTH EVERY 7 (SEVEN) DAYS, Disp: 12 capsule, Rfl: 0   zolpidem (AMBIEN) 10 MG tablet, TAKE 1 TABLET BY MOUTH EVERY DAY AT BEDTIME AS NEEDED, Disp: 30 tablet, Rfl: 1   buPROPion (WELLBUTRIN SR) 150 MG 12 hr tablet, Take 1 tablet (150 mg total) by mouth every evening., Disp: , Rfl:    buPROPion (WELLBUTRIN XL) 300 MG 24 hr tablet, Take 1 tablet (300 mg total) by mouth in the morning., Disp: , Rfl:    Allergies  Allergen Reactions   Valsartan Swelling   Nifedipine Hives and Itching   CONSTITUTIONAL: Negative for chills, fatigue, fever, unintentional weight gain and unintentional weight loss.   CARDIOVASCULAR: Negative for chest pain, dizziness, palpitations and pedal edema.  RESPIRATORY: Negative for recent cough and dyspnea.  GASTROINTESTINAL: Negative for abdominal pain, acid reflux symptoms, constipation, diarrhea, nausea and vomiting.  MSK: Negative for arthralgias and myalgias.  INTEGUMENTARY: Negative for rash.   PSYCHIATRIC: Negative for sleep disturbance and to question depression screen.  Negative for depression, negative for anhedonia.        Objective:    PHYSICAL EXAM:   VS: BP (!) 140/82   Pulse 72   Temp (!) 97 F (36.1 C) (Temporal)   Ht 5\' 7"  (1.702 m)   Wt 265 lb 12.8 oz (120.6 kg)   SpO2 97%   BMI 41.63 kg/m   GEN: Well nourished, well developed, in no acute distress   Cardiac: RRR; no murmurs, rubs, or gallops,no edema - no significant varicosities Respiratory:  normal respiratory rate and pattern with no distress - normal breath sounds with no rales, rhonchi, wheezes or rubs  MS: no deformity or atrophy  Skin: warm and dry, no  rash   Psych: euthymic mood, appropriate affect and demeanor     Health Maintenance Due  Topic Date Due   Colonoscopy  Never done    There are no preventive care reminders to display for this patient.  Lab Results  Component Value Date   TSH 2.170 12/10/2022   Lab Results  Component Value Date   WBC 6.7 12/10/2022   HGB 13.2 12/10/2022   HCT 39.5 12/10/2022   MCV 82 12/10/2022   PLT 246 12/10/2022   Lab Results  Component Value Date   NA 141 02/24/2023   K 3.7 02/24/2023   CO2 26 02/24/2023   GLUCOSE 136 (H) 02/24/2023   BUN 15 02/24/2023   CREATININE 1.22 (H) 02/24/2023   BILITOT 0.3 02/14/2023   ALKPHOS 49 02/14/2023   AST 33 02/14/2023   ALT 45 (H) 02/14/2023   PROT 6.2 02/14/2023   ALBUMIN 4.0 02/14/2023   CALCIUM 9.5 02/24/2023   EGFR 56 (L) 02/24/2023   GFR 67.41 11/06/2018   Lab Results  Component Value Date   CHOL 171 12/10/2022   Lab Results  Component Value Date   HDL 61 12/10/2022   Lab Results  Component Value Date   LDLCALC 95 12/10/2022   Lab Results  Component Value Date   TRIG 81 12/10/2022   Lab Results  Component Value Date   CHOLHDL 2.8 12/10/2022   Lab Results  Component Value Date   HGBA1C 5.7 (H) 12/10/2022      Assessment & Plan:   Problem List Items Addressed This Visit       Respiratory   Allergic rhinitis Continue meds   Other Visit Diagnoses     Hypokalemia  Relevant Orders   Labwork will be done by cardiology tomorrow Continue current meds and follow up with hypertensive clinic   Uncontrolled hypertension       Relevant Orders   CBC with Differential/Platelet   Comprehensive metabolic panel   TSH   Lipid panel Continue current meds and follow with hypertensive clinic   Vitamin D deficiency       Relevant Orders   VITAMIN D 25 Hydroxy (Vit-D Deficiency, Fractures)   Impaired fasting glucose       Relevant Orders   Recommend hgb A1c with labwork she has done tomorrow at cardiology clinic    Screening for colon cancer Referral to GI for colonoscopy    Meds ordered this encounter  Medications   buPROPion (WELLBUTRIN XL) 300 MG 24 hr tablet    Sig: Take 1 tablet (300 mg total) by mouth in the morning.    Order Specific Question:   Supervising Provider    Answer:   Corey Harold   buPROPion (WELLBUTRIN SR) 150 MG 12 hr tablet    Sig: Take 1 tablet (150 mg total) by mouth every evening.    Order Specific Question:   Supervising Provider    Answer:   Blane Ohara [phone]    Follow-up: Return in about 4 months (around 10/02/2023) for chronic fasting follow-up.    SARA R Sonika Levins, PA-C

## 2023-06-03 ENCOUNTER — Ambulatory Visit (HOSPITAL_BASED_OUTPATIENT_CLINIC_OR_DEPARTMENT_OTHER): Payer: BC Managed Care – PPO | Admitting: Family

## 2023-06-03 ENCOUNTER — Encounter (HOSPITAL_BASED_OUTPATIENT_CLINIC_OR_DEPARTMENT_OTHER): Payer: Self-pay | Admitting: Family

## 2023-06-03 VITALS — BP 119/82 | HR 64 | Ht 67.0 in | Wt 266.0 lb

## 2023-06-03 DIAGNOSIS — G4733 Obstructive sleep apnea (adult) (pediatric): Secondary | ICD-10-CM

## 2023-06-03 DIAGNOSIS — E876 Hypokalemia: Secondary | ICD-10-CM

## 2023-06-03 DIAGNOSIS — R7303 Prediabetes: Secondary | ICD-10-CM | POA: Diagnosis not present

## 2023-06-03 DIAGNOSIS — I10 Essential (primary) hypertension: Secondary | ICD-10-CM | POA: Diagnosis not present

## 2023-06-03 NOTE — Progress Notes (Signed)
[facility] Initial Assessment:    Date:  06/03/2023   ID:  Amanda Barrett, DOB 1976/04/27, MRN [id number]  PCP:  Marianne Sofia, PA-C  Cardiologist:  Chilton Si, MD  Nephrologist:  Referring MD: Marianne Sofia, PA-C   CC: Hypertension  History of Present Illness:    Amanda Barrett is a 47 y.o. female with a hx of hypertension, anemia, vitamin D deficiency, depression, tobacco use, OSA here to follow up care in the [facility].   Amanda Barrett was diagnosed with hypertension when she was 47 years old. Amlodipine previously stopped due to gum issues. 01/2022 developed hives including having her face blowing up having to use epipen with ED visit 03/10/22 for angioedema. Her Nifedipine dose was increased - hives went away for a few days then as soon as Prednisone  dose completed has recurrent hives and Nifedipine discontinued. 04/2022 Clonidine initiated which took some time to adjust to. Frequent Hydralazine dosing previously dizzy.  Seen 08/2022 noting clondine and hydralazine combination made her drowsy for a few hours. Hydrochlorothiazide switched to chlorthalidone and Clonidine switched to patches.   Seen 10/2022 with BP in [place] 110-160. Unfortunately insurance denied renal denervation.  Seen 02/14/23 noting more often taking two doses of Hydralazine rather than three as missed midday dose. At that time, potassium supplement stopped and Spironolactone initiated.   She presents today for follow up.  Taking BP intermittently at home with readings 110s-140s. She added the Spironolactone in the morning. She is taking Hydralazine BID and will take additional tablet if she feels her BP is high (will have mild pressure in the back of her head). She notes having difficulty with her full face mask and CPAP, encouraged to trial nose mask which she has at home. Reports no shortness of breath nor dyspnea on exertion. Reports no chest pain, pressure, or tightness. No  edema, orthopnea, PND. Reports no palpitations.    Previous antihypertensives: Valsartan - allergic reaction (swelling) 01/2022 Nifedipine - allergic reaction (hives, itching) trialed 09/2018, 09/2021, 11/2021 - eventually discontinued 03/26/22 Amlodipine - gingival hyperplasia  Past Medical History:  Diagnosis Date   Anemia    Pernicious   Angio-edema    Hypertension    Recurrent upper respiratory infection (URI)    Resistant hypertension 09/14/2018   Urticaria     Past Surgical History:  Procedure Laterality Date   ADENOIDECTOMY     MYOMECTOMY     Uterine Fibroids   SINOSCOPY     SINUS EXPLORATION     TONSILLECTOMY      Current Medications: No outpatient medications have been marked as taking for the 06/03/23 encounter (Appointment) with Alver Sorrow, NP.     Allergies:   Valsartan and Nifedipine   Social History   Socioeconomic History   Marital status: Married    Spouse name: Gus Golembeski   Number of children: Not on file   Years of education: Not on file   Highest education level: Bachelor's degree (e.g., BA, AB, BS)  Occupational History   Occupation: Runner, broadcasting/film/video    Comment: Owner  Tobacco Use   Smoking status: Every Day    Current packs/day: 0.25    Average packs/day: 0.3 packs/day for 20.0 years (5.0 ttl pk-yrs)    Types: Cigarettes    Passive exposure: Current   Smokeless tobacco: Never  Vaping Use   Vaping status: Never Used  Substance and Sexual Activity   Alcohol use: No   Drug use:  No   Sexual activity: Yes    Partners: Male    Birth control/protection: None  Other Topics Concern   Not on file  Social History Narrative   Not on file   Social Determinants of Health   Financial Resource Strain: Low Risk  (06/02/2023)   Overall Financial Resource Strain (CARDIA)    Difficulty of Paying Living Expenses: Not hard at all  Food Insecurity: No Food Insecurity (06/02/2023)   Hunger Vital Sign    Worried About Running Out  of Food in the Last Year: Never true    Ran Out of Food in the Last Year: Never true  Transportation Needs: No Transportation Needs (06/02/2023)   PRAPARE - Administrator, Civil Service (Medical): No    Lack of Transportation (Non-Medical): No  Physical Activity: Insufficiently Active (06/02/2023)   Exercise Vital Sign    Days of Exercise per Week: 3 days    Minutes of Exercise per Session: 30 min  Stress: Stress Concern Present (06/02/2023)   [employer] of Occupational Health - Occupational Stress Questionnaire    Feeling of Stress : To some extent  Social Connections: Moderately Integrated (06/02/2023)   Social Connection and Isolation Panel [NHANES]    Frequency of Communication with Friends and Family: More than three times a week    Frequency of Social Gatherings with Friends and Family: Three times a week    Attends Religious Services: 1 to 4 times per year    Active Member of Clubs or Organizations: No    Attends Engineer, structural: Not on file    Marital Status: Married     Family History: The patient's family history includes Cancer in her maternal grandmother and paternal grandfather; Diabetes in her father, maternal grandfather, maternal grandmother, mother, and paternal grandmother; Heart disease in her maternal grandmother and mother; Hypertension in her father, maternal grandmother, mother, paternal grandfather, and paternal grandmother; Hypotension in her maternal grandfather; Kidney disease in her maternal grandmother; Other in her maternal grandfather; Stroke in her mother.  ROS:   Please see the history of present illness.     All other systems reviewed and are negative.  EKGs/Labs/Other Studies Reviewed:    EKG:  EKG is ordered today.  The ekg ordered today demonstrates NSR 68 bpm, no acute ST/T wave changes.   11/09/18 VAS US renal  Possible bilateral renal artery stenosis but accuracy of study limited by  vessel  tortuosity.   Consideration of CT Angiogram or contrast angiogram if pt renal function  is  reasonable and still high index of suspicion.    12/18/18 CT angio abdomen pelvis IMPRESSION: VASCULAR   1. No evidence of renal artery stenosis or other lesion to suggest a renovascular component of hypertension.   NON-VASCULAR:.   1. Fatty liver. 2. Scattered descending and sigmoid diverticula. 3. Degenerative disc disease L4-S1.  Recent Labs: 12/10/2022: Hemoglobin 13.2; Platelets 246; TSH 2.170 02/14/2023: ALT 45 02/24/2023: BUN 15; Creatinine, Ser 1.22; Potassium 3.7; Sodium 141   Recent Lipid Panel    Component Value Date/Time   CHOL 171 12/10/2022 1047   TRIG 81 12/10/2022 1047   HDL 61 12/10/2022 1047   CHOLHDL 2.8 12/10/2022 1047   CHOLHDL 3 10/18/2018 1027   VLDL 14.0 10/18/2018 1027   LDLCALC 95 12/10/2022 1047    Physical Exam:   VS:  There were no vitals taken for this visit. , BMI There is no height or weight on file to calculate BMI. GENERAL:  Well appearing, overweight HEENT: Pupils equal round and reactive, fundi not visualized, oral mucosa unremarkable NECK:  No jugular venous distention, waveform within normal limits, carotid upstroke brisk and symmetric, no bruits, no thyromegaly LYMPHATICS:  No cervical adenopathy LUNGS:  Clear to auscultation bilaterally HEART:  RRR.  PMI not displaced or sustained,S1 and S2 within normal limits, no S3, no S4, no clicks, no rubs, no murmurs ABD:  Flat, nontender, nondistended  EXT:  2 plus pulses throughout, no edema, no cyanosis no clubbing SKIN:  No rashes no nodules NEURO:  Cranial nerves II through XII grossly intact, motor grossly intact throughout PSYCH:  Cognitively intact, oriented to person place and time   ASSESSMENT/PLAN:    Hypertension -We will continue carvedilol 25mg  BID, clonidine 0.3mg   patch, Chlorthalidone 25mg  daily, hydralazine 50mg  BID, Spironolactone 25mg  daily at present doses as BP controlled in clinic. Would  hesitate to use ACE/ARB/Arni due to previous angioedema with valsartan.  Insurance unfortunately denied RDN, could consider re-trying precert in new year in case their coverage changes.  Update BMP after addition of Spironolactone. OSA treated Prior renal workup unremarkable Cortisol, catecholamines, metanephrines unremarkable.  OSA - CPAP compliance encouraged. Not using regularly, encouraged to resume.  Prediabetes - Update A1c per PCP request.  Depression-follows with primary care provider  Tobacco use- Smoking cessation encouraged. Recommend utilization of [id number].   Screening for Secondary Hypertension:     07/01/2022    1:31 PM  Causes  Drugs/Herbals Screened  Renovascular HTN Screened     - Comments 12/2018 negative CT  Sleep Apnea Screened     - Comments 06/2022 [facility] sleep study  Thyroid Disease Screened     - Comments 02/2022 normal TSH  Hyperaldosteronism Screened     - Comments 11/2018 normal renin aldosterone  Pheochromocytoma Screened     - Comments 06/2022 labs ordered  Cushing's Syndrome Screened     - Comments 06/2022 labs ordered  Coarctation of the Aorta Not Screened  Compliance Screened     - Comments 06/2022 Hydralaine BID instead of QID    Relevant Labs/Studies:    Latest Ref Rng & Units 02/24/2023    1:45 PM 02/14/2023   10:18 AM 01/11/2023   10:00 AM  Basic Labs  Sodium 134 - 144 mmol/L 141  141  140   Potassium 3.5 - 5.2 mmol/L 3.7  3.0  3.1   Creatinine 0.57 - 1.00 mg/dL 0.86  5.78  4.69        Latest Ref Rng & Units 12/10/2022   10:47 AM 02/25/2022    4:38 PM  Thyroid   TSH 0.450 - 4.500 uIU/mL 2.170  2.330        Latest Ref Rng & Units 11/06/2018    3:48 PM  Renin/Aldosterone   Aldosterone  ng/dL 21        Latest Ref Rng & Units 08/18/2022   10:57 AM  Metanephrines/Catecholamines   Epinephrine 0 - 62 pg/mL 18   Norepinephrine 0 - 874 pg/mL 266   Dopamine 0 - 48 pg/mL <30   Metanephrines 0.0 - 88.0 pg/mL <25.0   Normetanephrines  0.0  - 218.9 pg/mL 36.0           11/09/2018    9:18 AM  Renovascular   Renal Artery [place] Completed Yes     Disposition:    FU with PharmD/MD/APP in 6 months   Medication Adjustments/Labs and Tests Ordered: Current medicines are reviewed at length with the patient today.  Concerns  regarding medicines are outlined above.  No orders of the defined types were placed in this encounter.  No orders of the defined types were placed in this encounter.    Signed, Alver Sorrow, NP  06/03/2023 11:23 AM    [facility]

## 2023-06-03 NOTE — Patient Instructions (Signed)
Medication Instructions:  Continue your current medications.   *If you need a refill on your cardiac medications before your next appointment, please call your pharmacy*   Lab Work: Your physician recommends that you return for lab work today: BMP, A1c  If you have labs (blood work) drawn today and your tests are completely normal, you will receive your results only by: MyChart Message (if you have MyChart) OR A paper copy in the mail If you have any lab test that is abnormal or we need to change your treatment, we will call you to review the results.  Follow-Up: At [facility], you and your health needs are our priority.  As part of our continuing mission to provide you with exceptional heart care, we have created designated Provider Care Teams.  These Care Teams include your primary Cardiologist (physician) and Advanced Practice Providers (APPs -  Physician Assistants and Nurse Practitioners) who all work together to provide you with the care you need, when you need it.  We recommend signing up for the patient portal called "MyChart".  Sign up information is provided on this After Visit Summary.  MyChart is used to connect with patients for Virtual Visits (Telemedicine).  Patients are able to view lab/test results, encounter notes, upcoming appointments, etc.  Non-urgent messages can be sent to your provider as well.   To learn more about what you can do with MyChart, go to [email or web address].    Your next appointment:   6 month(s) with Advanced Hypertension Clinic

## 2023-06-04 LAB — BASIC METABOLIC PANEL
BUN/Creatinine Ratio: 12 (ref 9–23)
BUN: 14 mg/dL (ref 6–24)
CO2: 26 mmol/L (ref 20–29)
Calcium: 9.9 mg/dL (ref 8.7–10.2)
Chloride: 98 mmol/L (ref 96–106)
Creatinine, Ser: 1.18 mg/dL — ABNORMAL HIGH (ref 0.57–1.00)
Glucose: 135 mg/dL — ABNORMAL HIGH (ref 70–99)
Potassium: 4.1 mmol/L (ref 3.5–5.2)
Sodium: 140 mmol/L (ref 134–144)
eGFR: 58 mL/min/{1.73_m2} — ABNORMAL LOW (ref >59)

## 2023-06-04 LAB — HEMOGLOBIN A1C
Est. average glucose Bld gHb Est-mCnc: 114 mg/dL
Hgb A1c MFr Bld: 5.6 % (ref 4.8–5.6)

## 2023-06-05 ENCOUNTER — Other Ambulatory Visit: Payer: Self-pay | Admitting: Physician Assistant

## 2023-06-05 DIAGNOSIS — I10 Essential (primary) hypertension: Secondary | ICD-10-CM

## 2023-06-06 ENCOUNTER — Other Ambulatory Visit: Payer: Self-pay | Admitting: Physician Assistant

## 2023-06-06 DIAGNOSIS — R21 Rash and other nonspecific skin eruption: Secondary | ICD-10-CM

## 2023-06-23 ENCOUNTER — Other Ambulatory Visit: Payer: Self-pay | Admitting: Physician Assistant

## 2023-06-23 DIAGNOSIS — F5101 Primary insomnia: Secondary | ICD-10-CM

## 2023-06-27 ENCOUNTER — Other Ambulatory Visit: Payer: Self-pay

## 2023-06-27 ENCOUNTER — Encounter: Payer: Self-pay | Admitting: Physician Assistant

## 2023-06-27 DIAGNOSIS — F5101 Primary insomnia: Secondary | ICD-10-CM

## 2023-06-27 MED ORDER — ZOLPIDEM TARTRATE 10 MG PO TABS: ORAL_TABLET | ORAL | 1 refills | Status: DC

## 2023-07-15 ENCOUNTER — Other Ambulatory Visit: Payer: Self-pay | Admitting: Physician Assistant

## 2023-08-04 ENCOUNTER — Other Ambulatory Visit: Payer: Self-pay | Admitting: Physician Assistant

## 2023-08-19 ENCOUNTER — Other Ambulatory Visit: Payer: Self-pay | Admitting: Family Medicine

## 2023-08-19 ENCOUNTER — Other Ambulatory Visit: Payer: Self-pay | Admitting: Cardiovascular Disease

## 2023-08-19 DIAGNOSIS — F331 Major depressive disorder, recurrent, moderate: Secondary | ICD-10-CM

## 2023-08-22 ENCOUNTER — Ambulatory Visit: Payer: BC Managed Care – PPO | Attending: Cardiology | Admitting: Cardiology

## 2023-08-23 ENCOUNTER — Encounter: Payer: Self-pay | Admitting: Cardiology

## 2023-08-24 ENCOUNTER — Other Ambulatory Visit: Payer: Self-pay | Admitting: Physician Assistant

## 2023-08-24 DIAGNOSIS — F5101 Primary insomnia: Secondary | ICD-10-CM

## 2023-09-02 ENCOUNTER — Other Ambulatory Visit: Payer: Self-pay | Admitting: Physician Assistant

## 2023-09-02 DIAGNOSIS — R21 Rash and other nonspecific skin eruption: Secondary | ICD-10-CM

## 2023-09-03 ENCOUNTER — Other Ambulatory Visit (HOSPITAL_BASED_OUTPATIENT_CLINIC_OR_DEPARTMENT_OTHER): Payer: Self-pay | Admitting: Family

## 2023-09-03 DIAGNOSIS — I1A0 Resistant hypertension: Secondary | ICD-10-CM

## 2023-09-04 ENCOUNTER — Other Ambulatory Visit: Payer: Self-pay | Admitting: Physician Assistant

## 2023-09-04 DIAGNOSIS — I10 Essential (primary) hypertension: Secondary | ICD-10-CM

## 2023-10-11 ENCOUNTER — Other Ambulatory Visit: Payer: Self-pay | Admitting: Physician Assistant

## 2023-10-11 ENCOUNTER — Other Ambulatory Visit: Payer: Self-pay | Admitting: Cardiovascular Disease

## 2023-10-11 IMAGING — MG MM DIGITAL SCREENING BILAT W/ TOMO AND CAD
6 of 10 series · 6 of 30 positions shown · non-contrast
Comparison: Previous exam(s).

CLINICAL DATA: Screening.

EXAM:
DIGITAL SCREENING BILATERAL MAMMOGRAM WITH TOMOSYNTHESIS AND CAD
TECHNIQUE: Bilateral screening digital craniocaudal and mediolateral oblique
mammograms were obtained. Bilateral screening digital breast
tomosynthesis was performed. The images were evaluated with
computer-aided detection.

[L MLO synth-2D (1 of 2)]
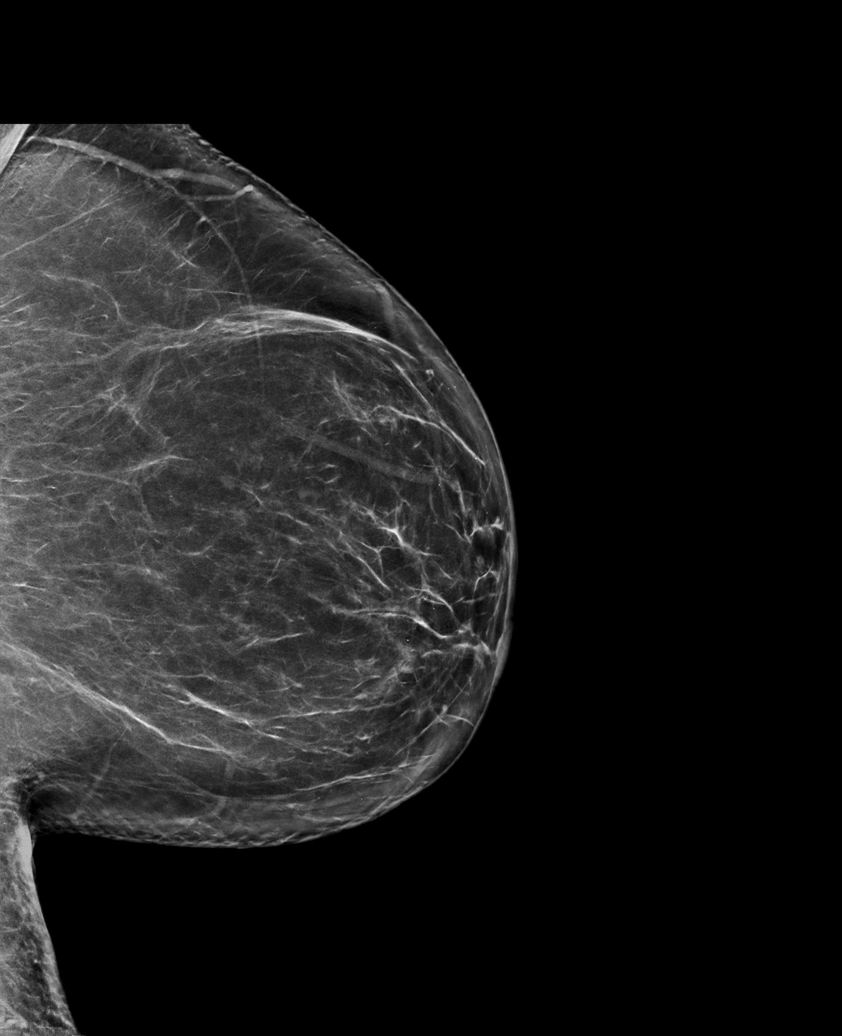

[L MLO synth-2D (2 of 2)]
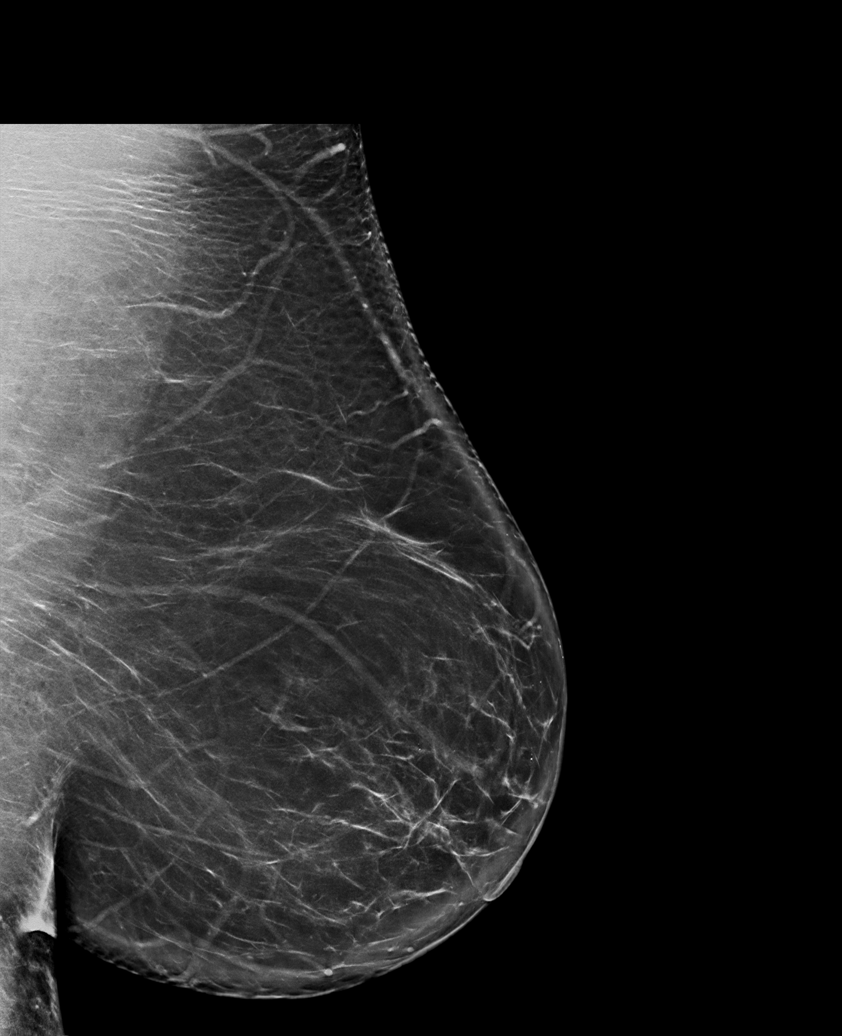

[L CC synth-2D]
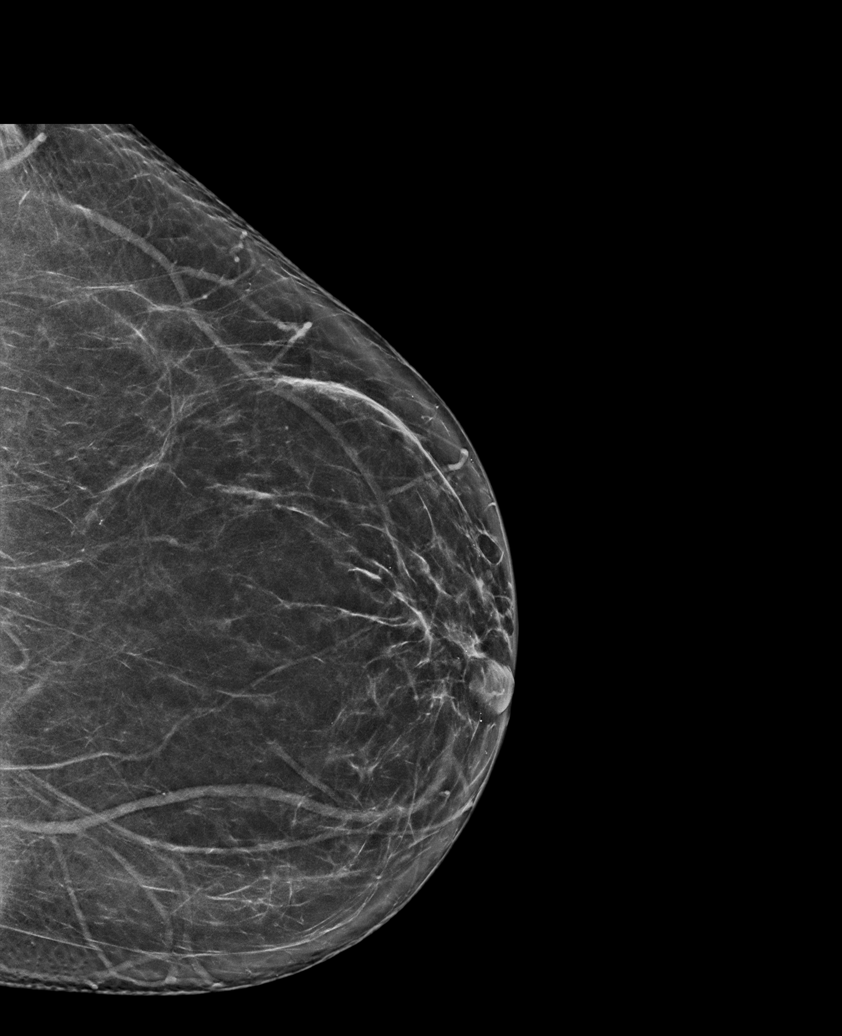

[R MLO synth-2D]
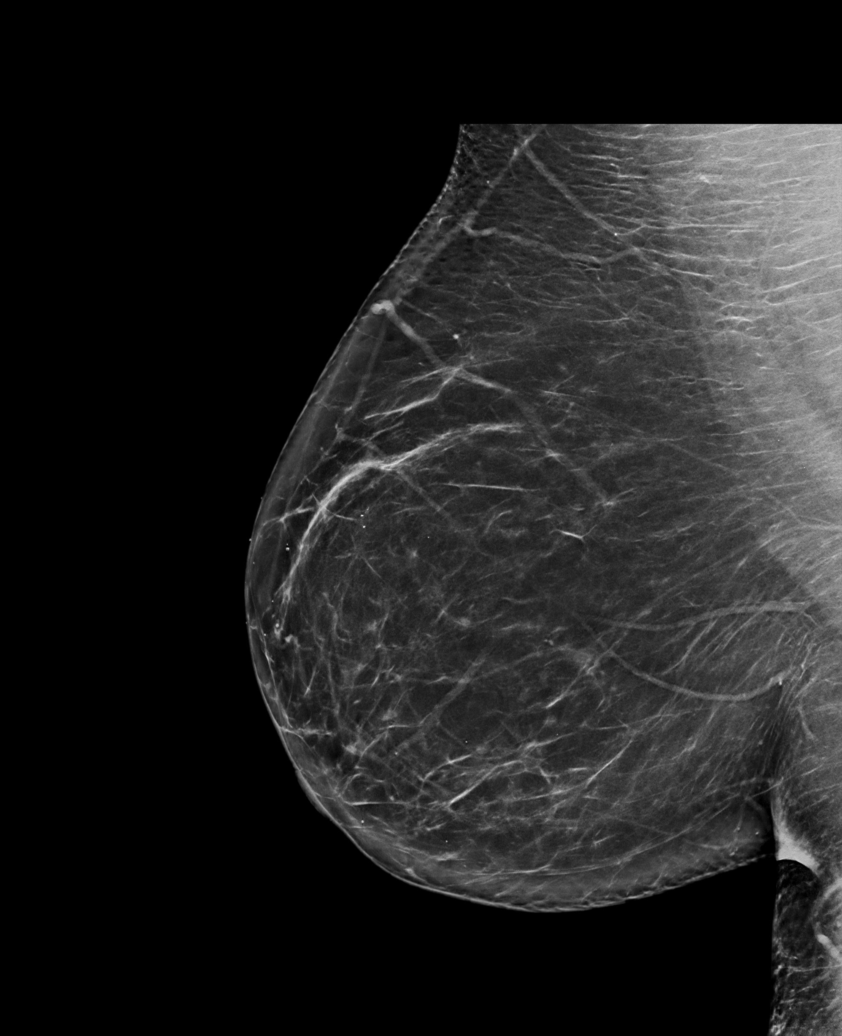

[R CC synth-2D]
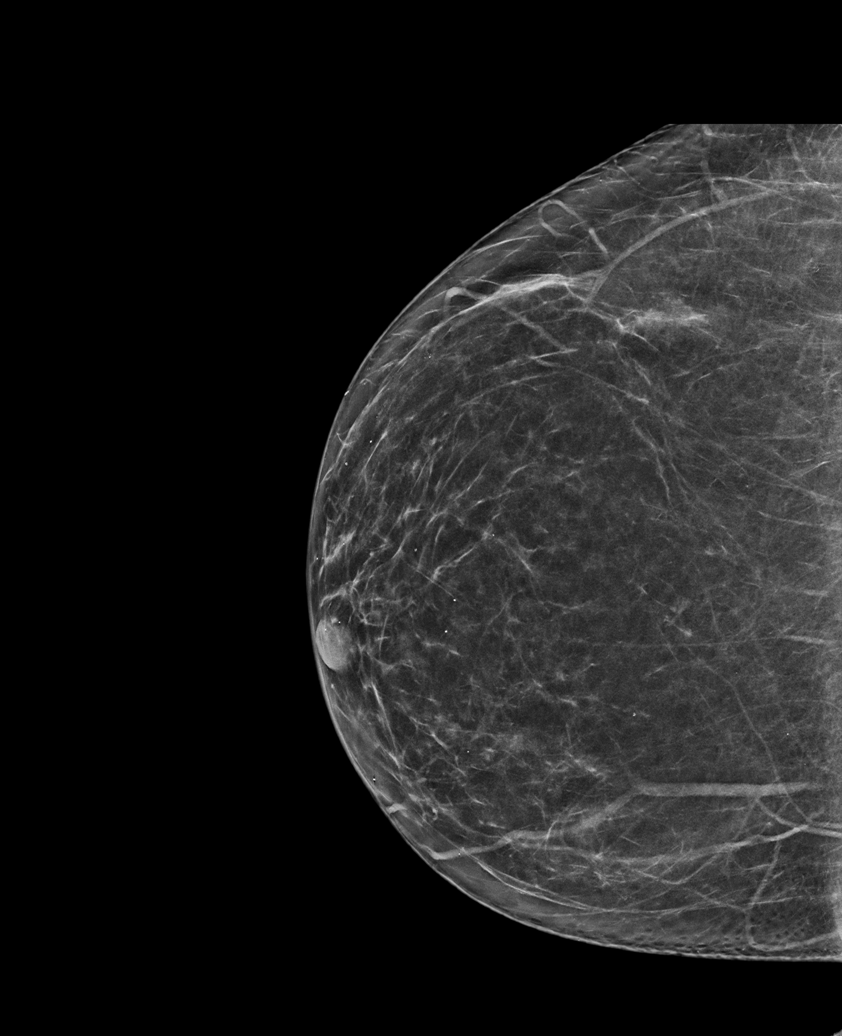

[L CC tomo · tomo slice 39/76.0]
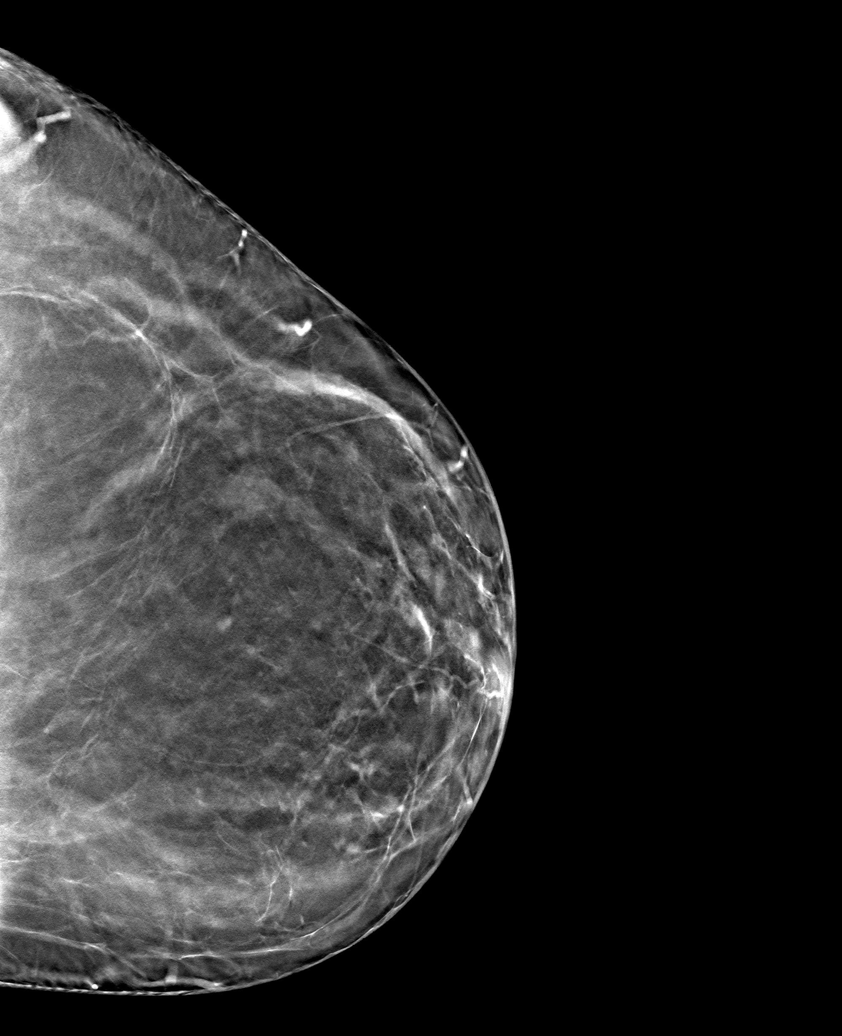

[6 of 30 positions shown; findings below may reference images not displayed]

ACR Breast Density Category b: There are scattered areas of
fibroglandular density.
FINDINGS: There are no findings suspicious for malignancy.
IMPRESSION: No mammographic evidence of malignancy. A result letter of this
screening mammogram will be mailed directly to the patient.

RECOMMENDATION:
Screening mammogram in one year. (Code:51-O-LD2)

BI-RADS CATEGORY  1: Negative.

## 2023-10-12 ENCOUNTER — Ambulatory Visit: Payer: BC Managed Care – PPO | Admitting: Physician Assistant

## 2023-10-12 ENCOUNTER — Encounter: Payer: Self-pay | Admitting: Physician Assistant

## 2023-10-12 VITALS — BP 124/74 | HR 68 | Temp 97.4°F | Ht 67.0 in | Wt 265.0 lb

## 2023-10-12 DIAGNOSIS — I10 Essential (primary) hypertension: Secondary | ICD-10-CM

## 2023-10-12 DIAGNOSIS — R7301 Impaired fasting glucose: Secondary | ICD-10-CM | POA: Insufficient documentation

## 2023-10-12 DIAGNOSIS — E559 Vitamin D deficiency, unspecified: Secondary | ICD-10-CM

## 2023-10-12 DIAGNOSIS — Z6841 Body Mass Index (BMI) 40.0 and over, adult: Secondary | ICD-10-CM | POA: Insufficient documentation

## 2023-10-12 DIAGNOSIS — E66813 Obesity, class 3: Secondary | ICD-10-CM

## 2023-10-12 DIAGNOSIS — K219 Gastro-esophageal reflux disease without esophagitis: Secondary | ICD-10-CM

## 2023-10-12 DIAGNOSIS — F331 Major depressive disorder, recurrent, moderate: Secondary | ICD-10-CM

## 2023-10-12 MED ORDER — VITAMIN D (ERGOCALCIFEROL) 1.25 MG (50000 UNIT) PO CAPS: 50000.0000 [IU] | ORAL_CAPSULE | ORAL | 1 refills | Status: DC

## 2023-10-12 MED ORDER — CLONIDINE 0.3 MG/24HR TD PTWK: 0.3000 mg | MEDICATED_PATCH | TRANSDERMAL | 1 refills | Status: DC

## 2023-10-12 NOTE — Progress Notes (Signed)
 Established Patient Office Visit  Subjective:  Patient ID: Amanda Barrett, female    DOB: Aug 14, 1976  Age: 48 y.o. MRN: [id number] PT NEW TO ME - Amanda Barrett PT CC:  Chief Complaint  Patient presents with   Medical Management of Chronic Issues    HPI Amanda Barrett presents for chronic follow up  Pt with history of hypertension.  She has had issues since age 9.  She currently is following with the blood pressure clinic in [place] because she has had uncontrolled hypertension that has been resistant to medications and pt also allergic to a few bp medications.  She is in a study currently that is monitoring her bp readings daily.  She has had her kidneys evaluated in the past.  Currently she is trying to be approved for renal denervation Her current bp meds include coreg , chlorthalidone , clonidine , hydralazine  She is also on potassium supplements BP good today at 119/82  Pt with history of mild depression - states symptoms stable on wellbutrin  XL 300mg  qd with wellbutrin  150mg  qd  She has history of allergic rhinitis and takes zyrtec , flonase  and pepcid   Pt with history of insomnia - takes ambien  as needed which works well for her  Pt with history of Vit D def - taking weekly supplement and due for labwork and requests refill of med  Pt would like to try medication for weight loss - she will check with her insurance and see if they cover GLP1s  Past Medical History:  Diagnosis Date   Anemia    Pernicious   Angio-edema    Hypertension    Recurrent upper respiratory infection (URI)    Resistant hypertension 09/14/2018   Urticaria     Past Surgical History:  Procedure Laterality Date   ADENOIDECTOMY     MYOMECTOMY     Uterine Fibroids   SINOSCOPY     SINUS EXPLORATION     TONSILLECTOMY      Family History  Problem Relation Age of Onset   Stroke Mother    Hypertension Mother    Diabetes Mother        type 1   Heart disease Mother    Hypertension Father    Diabetes  Father        Type 2   Cancer Maternal Grandmother        Ovarian   Heart disease Maternal Grandmother    Hypertension Maternal Grandmother    Diabetes Maternal Grandmother        Type 1   Kidney disease Maternal Grandmother    Diabetes Maternal Grandfather        Type 2   Other Maternal Grandfather        ITP   Hypotension Maternal Grandfather    Diabetes Paternal Grandmother        Type 2   Hypertension Paternal Grandmother    Cancer Paternal Grandfather        Bladder   Hypertension Paternal Grandfather     Social History   Socioeconomic History   Marital status: Married    Spouse name: Amanda Barrett Radio Broadcast Assistant   Number of children: Not on file   Years of education: Not on file   Highest education level: Bachelor's degree (e.g., BA, AB, BS)  Occupational History   Occupation: Runner, Broadcasting/film/video    Comment: Owner  Tobacco Use   Smoking status: Some Days    Current packs/day: 0.25    Average packs/day: 0.3 packs/day for 20.0 years (5.0  ttl pk-yrs)    Types: Cigarettes    Passive exposure: Current   Smokeless tobacco: Never  Vaping Use   Vaping status: Never Used  Substance and Sexual Activity   Alcohol use: No   Drug use: No   Sexual activity: Yes    Partners: Male    Birth control/protection: None  Other Topics Concern   Not on file  Social History Narrative   Not on file   Social Drivers of Health   Financial Resource Strain: Low Risk  (10/11/2023)   Overall Financial Resource Strain (CARDIA)    Difficulty of Paying Living Expenses: Not hard at all  Food Insecurity: No Food Insecurity (10/11/2023)   Hunger Vital Sign    Worried About Running Out of Food in the Last Year: Never true    Ran Out of Food in the Last Year: Never true  Transportation Needs: No Transportation Needs (10/11/2023)   PRAPARE - Administrator, Civil Service (Medical): No    Lack of Transportation (Non-Medical): No  Physical Activity: Insufficiently Active (10/11/2023)    Exercise Vital Sign    Days of Exercise per Week: 2 days    Minutes of Exercise per Session: 20 min  Stress: No Stress Concern Present (10/11/2023)   [employer] of Occupational Health - Occupational Stress Questionnaire    Feeling of Stress : Not at all  Social Connections: Moderately Integrated (10/11/2023)   Social Connection and Isolation Panel [NHANES]    Frequency of Communication with Friends and Family: More than three times a week    Frequency of Social Gatherings with Friends and Family: More than three times a week    Attends Religious Services: 1 to 4 times per year    Active Member of [employer] or Organizations: No    Attends Banker Meetings: Not on file    Marital Status: Married  Catering Manager Violence: Not At Risk (09/15/2021)   Humiliation, Afraid, Rape, and Kick questionnaire    Fear of Current or Ex-Partner: No    Emotionally Abused: No    Physically Abused: No    Sexually Abused: No     Current Outpatient Medications:    albuterol  (PROAIR  HFA) 108 (90 Base) MCG/ACT inhaler, Inhale 2 puffs into the lungs every 4 (four) hours as needed for wheezing or shortness of breath., Disp: 1 each, Rfl: 2   buPROPion  (WELLBUTRIN  SR) 150 MG 12 hr tablet, Take 1 tablet (150 mg total) by mouth every evening., Disp: , Rfl:    buPROPion  (WELLBUTRIN  XL) 300 MG 24 hr tablet, TAKE 1 TABLET BY MOUTH EVERY DAY, Disp: 90 tablet, Rfl: 0   carvedilol  (COREG ) 25 MG tablet, TAKE 1 TABLET (25 MG TOTAL) BY MOUTH TWICE A DAY WITH MEALS, Disp: 180 tablet, Rfl: 0   cetirizine  (ZYRTEC ) 10 MG tablet, TAKE 1 TABLET BY MOUTH EVERY DAY, Disp: 90 tablet, Rfl: 1   chlorthalidone  (HYGROTON ) 25 MG tablet, TAKE 1 TABLET (25 MG TOTAL) BY MOUTH DAILY., Disp: 90 tablet, Rfl: 3   EPINEPHrine  0.3 mg/0.3 mL IJ SOAJ injection, Inject 0.3 mg into the muscle as needed for anaphylaxis., Disp: 1 each, Rfl: 1   famotidine  (PEPCID ) 20 MG tablet, TAKE 1 TABLET BY MOUTH TWICE A DAY, Disp: 180 tablet, Rfl: 0    fluticasone  (FLONASE ) 50 MCG/ACT nasal spray, Place 2 sprays into both nostrils daily., Disp: 16 g, Rfl: 0   hydrALAZINE  (APRESOLINE ) 50 MG tablet, Take 1 tablet (50 mg total) by mouth in  the morning and at bedtime., Disp: 180 tablet, Rfl: 3   spironolactone  (ALDACTONE ) 25 MG tablet, Take 1 tablet (25 mg total) by mouth daily., Disp: 90 tablet, Rfl: 3   triamcinolone  cream (KENALOG ) 0.1 %, APPLY 1 APPLICATION. TOPICALLY 2 (TWO) TIMES DAILY., Disp: 30 g, Rfl: 0   zolpidem  (AMBIEN ) 10 MG tablet, TAKE 1 TABLET BY MOUTH EVERY DAY AT BEDTIME AS NEEDED, Disp: 30 tablet, Rfl: 1   cloNIDine  (CATAPRES  - DOSED IN MG/24 HR) 0.3 mg/24hr patch, Place 1 patch (0.3 mg total) onto the skin once a week., Disp: 12 patch, Rfl: 1   Vitamin D , Ergocalciferol , (DRISDOL ) 1.25 MG (50000 UNIT) CAPS capsule, Take 1 capsule (50,000 Units total) by mouth every 7 (seven) days., Disp: 12 capsule, Rfl: 1   Allergies  Allergen Reactions   Valsartan  Swelling   Nifedipine  Hives and Itching    CONSTITUTIONAL: Negative for chills, fatigue, fever, unintentional weight gain and unintentional weight loss.  E/N/T: Negative for ear pain, nasal congestion and sore throat.  CARDIOVASCULAR: Negative for chest pain, dizziness, palpitations and pedal edema.  RESPIRATORY: Negative for recent cough and dyspnea.  GASTROINTESTINAL: Negative for abdominal pain, acid reflux symptoms, constipation, diarrhea, nausea and vomiting.  MSK: Negative for arthralgias and myalgias.  INTEGUMENTARY: Negative for rash.  NEUROLOGICAL: Negative for dizziness and headaches.  PSYCHIATRIC: Negative for sleep disturbance and to question depression screen.  Negative for depression, negative for anhedonia.      Objective:  PHYSICAL EXAM:   VS: BP 124/74 (BP Location: Left Arm, Patient Position: Sitting)   Pulse 68   Temp (!) 97.4 F (36.3 C) (Temporal)   Ht 5' 7 (1.702 m)   Wt 265 lb (120.2 kg)   SpO2 97%   BMI 41.50 kg/m   GEN: Well nourished,  well developed, in no acute distress   Cardiac: RRR; no murmurs, rubs, or gallops,no edema  Respiratory:  normal respiratory rate and pattern with no distress - normal breath sounds with no rales, rhonchi, wheezes or rubs  MS: no deformity or atrophy  Skin: warm and dry, no rash  Neuro:  Alert and Oriented x 3,  CN II-Xii grossly intact Psych: euthymic mood, appropriate affect and demeanor   Lab Results  Component Value Date   TSH 2.170 12/10/2022   Lab Results  Component Value Date   WBC 6.7 12/10/2022   HGB 13.2 12/10/2022   HCT 39.5 12/10/2022   MCV 82 12/10/2022   PLT 246 12/10/2022   Lab Results  Component Value Date   NA 140 06/03/2023   K 4.1 06/03/2023   CO2 26 06/03/2023   GLUCOSE 135 (H) 06/03/2023   BUN 14 06/03/2023   CREATININE 1.18 (H) 06/03/2023   BILITOT 0.3 02/14/2023   ALKPHOS 49 02/14/2023   AST 33 02/14/2023   ALT 45 (H) 02/14/2023   PROT 6.2 02/14/2023   ALBUMIN 4.0 02/14/2023   CALCIUM 9.9 06/03/2023   EGFR 58 (L) 06/03/2023   GFR 67.41 11/06/2018   Lab Results  Component Value Date   CHOL 171 12/10/2022   Lab Results  Component Value Date   HDL 61 12/10/2022   Lab Results  Component Value Date   LDLCALC 95 12/10/2022   Lab Results  Component Value Date   TRIG 81 12/10/2022   Lab Results  Component Value Date   CHOLHDL 2.8 12/10/2022   Lab Results  Component Value Date   HGBA1C 5.6 06/03/2023      Assessment & Plan:  Problem List Items Addressed This Visit       Respiratory   Allergic rhinitis   Other Visit Diagnoses     Essential hypertension    -  Primary      Relevant Orders   Labwork pending Continue current meds and follow up with hypertensive clinic      GERD   Continue pepcid       Moderate episode of recurrent major depressive disorder (HCC)   Continue wellbutrin  as directed   Vitamin D  deficiency       Relevant Orders   VITAMIN D  25 Hydroxy (Vit-D Deficiency, Fractures)   Impaired fasting glucose        Relevant Orders   Hemoglobin A1c       Meds ordered this encounter  Medications   Vitamin D , Ergocalciferol , (DRISDOL ) 1.25 MG (50000 UNIT) CAPS capsule    Sig: Take 1 capsule (50,000 Units total) by mouth every 7 (seven) days.    Dispense:  12 capsule    Refill:  1    Supervising Provider:   COX, KIRSTEN [[id number]]   cloNIDine  (CATAPRES  - DOSED IN MG/24 HR) 0.3 mg/24hr patch    Sig: Place 1 patch (0.3 mg total) onto the skin once a week.    Dispense:  12 patch    Refill:  1    Supervising Provider:   SHERRE CLAPPER [id number]    Follow-up: Return in about 6 months (around 04/10/2024) for chronic fasting follow-up.    SARA R Geron Mulford, PA-C

## 2023-10-13 LAB — CBC WITH DIFFERENTIAL/PLATELET
Basophils Absolute: 0.1 10*3/uL (ref 0.0–0.2)
Basos: 1 %
EOS (ABSOLUTE): 0.2 10*3/uL (ref 0.0–0.4)
Eos: 3 %
Hematocrit: 43.7 % (ref 34.0–46.6)
Hemoglobin: 15 g/dL (ref 11.1–15.9)
Immature Grans (Abs): 0 10*3/uL (ref 0.0–0.1)
Immature Granulocytes: 0 %
Lymphocytes Absolute: 2.1 10*3/uL (ref 0.7–3.1)
Lymphs: 32 %
MCH: 30.2 pg (ref 26.6–33.0)
MCHC: 34.3 g/dL (ref 31.5–35.7)
MCV: 88 fL (ref 79–97)
Monocytes Absolute: 0.6 10*3/uL (ref 0.1–0.9)
Monocytes: 9 %
Neutrophils Absolute: 3.6 10*3/uL (ref 1.4–7.0)
Neutrophils: 55 %
Platelets: 235 10*3/uL (ref 150–450)
RBC: 4.96 x10E6/uL (ref 3.77–5.28)
RDW: 14.1 % (ref 11.7–15.4)
WBC: 6.5 10*3/uL (ref 3.4–10.8)

## 2023-10-13 LAB — LIPID PANEL
Chol/HDL Ratio: 3.1 {ratio} (ref 0.0–4.4)
Cholesterol, Total: 171 mg/dL (ref 100–199)
HDL: 56 mg/dL (ref >39)
LDL Chol Calc (NIH): 98 mg/dL (ref 0–99)
Triglycerides: 90 mg/dL (ref 0–149)
VLDL Cholesterol Cal: 17 mg/dL (ref 5–40)

## 2023-10-13 LAB — COMPREHENSIVE METABOLIC PANEL
ALT: 38 [IU]/L — ABNORMAL HIGH (ref 0–32)
AST: 26 [IU]/L (ref 0–40)
Albumin: 4.2 g/dL (ref 3.9–4.9)
Alkaline Phosphatase: 56 [IU]/L (ref 44–121)
BUN/Creatinine Ratio: 14 (ref 9–23)
BUN: 17 mg/dL (ref 6–24)
Bilirubin Total: 0.5 mg/dL (ref 0.0–1.2)
CO2: 25 mmol/L (ref 20–29)
Calcium: 9.5 mg/dL (ref 8.7–10.2)
Chloride: 98 mmol/L (ref 96–106)
Creatinine, Ser: 1.21 mg/dL — ABNORMAL HIGH (ref 0.57–1.00)
Globulin, Total: 2.3 g/dL (ref 1.5–4.5)
Glucose: 88 mg/dL (ref 70–99)
Potassium: 3.4 mmol/L — ABNORMAL LOW (ref 3.5–5.2)
Sodium: 140 mmol/L (ref 134–144)
Total Protein: 6.5 g/dL (ref 6.0–8.5)
eGFR: 56 mL/min/{1.73_m2} — ABNORMAL LOW (ref >59)

## 2023-10-13 LAB — HEMOGLOBIN A1C
Est. average glucose Bld gHb Est-mCnc: 111 mg/dL
Hgb A1c MFr Bld: 5.5 % (ref 4.8–5.6)

## 2023-10-13 LAB — VITAMIN D 25 HYDROXY (VIT D DEFICIENCY, FRACTURES): Vit D, 25-Hydroxy: 63.4 ng/mL (ref 30.0–100.0)

## 2023-10-13 LAB — TSH: TSH: 3.11 u[IU]/mL (ref 0.450–4.500)

## 2023-10-22 ENCOUNTER — Other Ambulatory Visit: Payer: Self-pay | Admitting: Physician Assistant

## 2023-10-22 DIAGNOSIS — F5101 Primary insomnia: Secondary | ICD-10-CM

## 2023-11-14 ENCOUNTER — Ambulatory Visit (AMBULATORY_SURGERY_CENTER): Payer: BC Managed Care – PPO

## 2023-11-14 VITALS — Ht 67.0 in | Wt 265.0 lb

## 2023-11-14 DIAGNOSIS — Z1211 Encounter for screening for malignant neoplasm of colon: Secondary | ICD-10-CM

## 2023-11-14 MED ORDER — SUFLAVE 178.7 G PO SOLR: 1.0000 | ORAL | 0 refills | Status: DC

## 2023-11-14 NOTE — Progress Notes (Signed)

## 2023-11-21 ENCOUNTER — Ambulatory Visit (HOSPITAL_BASED_OUTPATIENT_CLINIC_OR_DEPARTMENT_OTHER)
Admission: RE | Admit: 2023-11-21 | Discharge: 2023-11-21 | Disposition: A | Discharge status: Home / Self Care | Payer: BC Managed Care – PPO | Source: Ambulatory Visit

## 2023-11-21 ENCOUNTER — Encounter (HOSPITAL_BASED_OUTPATIENT_CLINIC_OR_DEPARTMENT_OTHER): Payer: Self-pay | Admitting: Radiology

## 2023-11-21 DIAGNOSIS — Z1231 Encounter for screening mammogram for malignant neoplasm of breast: Secondary | ICD-10-CM | POA: Diagnosis not present

## 2023-11-27 ENCOUNTER — Encounter: Payer: Self-pay | Admitting: Certified Registered Nurse Anesthetist

## 2023-12-02 ENCOUNTER — Encounter: Payer: Self-pay | Admitting: Gastroenterology

## 2023-12-05 ENCOUNTER — Encounter: Payer: Self-pay | Admitting: Gastroenterology

## 2023-12-05 ENCOUNTER — Ambulatory Visit (AMBULATORY_SURGERY_CENTER): Payer: BC Managed Care – PPO | Admitting: Gastroenterology

## 2023-12-05 VITALS — BP 112/67 | HR 56 | Temp 97.5°F | Resp 13 | Ht 67.0 in | Wt 265.0 lb

## 2023-12-05 DIAGNOSIS — K648 Other hemorrhoids: Secondary | ICD-10-CM

## 2023-12-05 DIAGNOSIS — K5140 Inflammatory polyps of colon without complications: Secondary | ICD-10-CM

## 2023-12-05 DIAGNOSIS — D125 Benign neoplasm of sigmoid colon: Secondary | ICD-10-CM

## 2023-12-05 DIAGNOSIS — K635 Polyp of colon: Secondary | ICD-10-CM

## 2023-12-05 DIAGNOSIS — K5730 Diverticulosis of large intestine without perforation or abscess without bleeding: Secondary | ICD-10-CM

## 2023-12-05 DIAGNOSIS — K562 Volvulus: Secondary | ICD-10-CM

## 2023-12-05 DIAGNOSIS — Z1211 Encounter for screening for malignant neoplasm of colon: Secondary | ICD-10-CM

## 2023-12-05 DIAGNOSIS — K6289 Other specified diseases of anus and rectum: Secondary | ICD-10-CM

## 2023-12-05 DIAGNOSIS — D123 Benign neoplasm of transverse colon: Secondary | ICD-10-CM

## 2023-12-05 HISTORY — DX: Morbid (severe) obesity due to excess calories: E66.01

## 2023-12-05 MED ORDER — SODIUM CHLORIDE 0.9 % IV SOLN: 500.0000 mL | INTRAVENOUS | Status: DC

## 2023-12-05 NOTE — Progress Notes (Signed)
 [facility] History and Physical   Primary Care Physician:  Marianne Sofia, PA-C   Reason for Procedure:   Colon cancer screening  Plan:    colonoscopy     HPI: Amanda Barrett is a 48 y.o. female  here for colonoscopy screening - first time exam.   Patient denies any bowel symptoms at this time. No family history of colon cancer known. Otherwise feels well without any cardiopulmonary symptoms.   I have discussed risks / benefits of anesthesia and endoscopic procedure with [name] and they wish to proceed with the exams as outlined today.    Past Medical History:  Diagnosis Date   Anemia    Pernicious   Angio-edema    Hypertension    Morbid (severe) obesity due to excess calories (HCC) 12/05/2023   bmi 41.50   Recurrent upper respiratory infection (URI)    Resistant hypertension 09/14/2018   Urticaria     Past Surgical History:  Procedure Laterality Date   ADENOIDECTOMY     MYOMECTOMY     Uterine Fibroids   SINOSCOPY     SINUS EXPLORATION     TONSILLECTOMY      Prior to Admission medications   Medication Sig Start Date End Date Taking? Authorizing Provider  buPROPion [facility] SR) 150 MG 12 hr tablet Take 1 tablet (150 mg total) by mouth every evening. 06/02/23  Yes Marianne Sofia, PA-C  buPROPion (WELLBUTRIN XL) 300 MG 24 hr tablet TAKE 1 TABLET BY MOUTH EVERY DAY 08/19/23  Yes Marianne Sofia, PA-C  carvedilol (COREG) 25 MG tablet TAKE 1 TABLET (25 MG TOTAL) BY MOUTH TWICE A DAY WITH MEALS 09/05/23  Yes Marianne Sofia, PA-C  cetirizine (ZYRTEC) 10 MG tablet TAKE 1 TABLET BY MOUTH EVERY DAY 08/04/23  Yes Marianne Sofia, PA-C  chlorthalidone (HYGROTON) 25 MG tablet TAKE 1 TABLET (25 MG TOTAL) BY MOUTH DAILY. 08/19/23  Yes Chilton Si, MD  cloNIDine (CATAPRES - DOSED IN MG/24 HR) 0.3 mg/24hr patch Place 1 patch (0.3 mg total) onto the skin once a week. 10/12/23  Yes Marianne Sofia, PA-C  famotidine (PEPCID) 20 MG tablet TAKE 1 TABLET BY MOUTH TWICE A DAY 09/05/23   Yes Marianne Sofia, PA-C  hydrALAZINE (APRESOLINE) 50 MG tablet Take 1 tablet (50 mg total) by mouth in the morning and at bedtime. 09/06/23  Yes Alver Sorrow, NP  spironolactone (ALDACTONE) 25 MG tablet Take 1 tablet (25 mg total) by mouth daily. 02/14/23  Yes Chilton Si, MD  Vitamin D, Ergocalciferol, (DRISDOL) 1.25 MG (50000 UNIT) CAPS capsule Take 1 capsule (50,000 Units total) by mouth every 7 (seven) days. 10/12/23  Yes Marianne Sofia, PA-C  zolpidem (AMBIEN) 10 MG tablet TAKE 1 TABLET BY MOUTH EVERY DAY AT BEDTIME AS NEEDED 10/24/23  Yes Craft, Huston Foley, PA  albuterol (PROAIR HFA) 108 (90 Base) MCG/ACT inhaler Inhale 2 puffs into the lungs every 4 (four) hours as needed for wheezing or shortness of breath. 09/10/20   Cirigliano, Jearld Lesch, DO  EPINEPHrine 0.3 mg/0.3 mL IJ SOAJ injection Inject 0.3 mg into the muscle as needed for anaphylaxis. 03/11/22   Nira Conn, MD  fluticasone (FLONASE) 50 MCG/ACT nasal spray Place 2 sprays into both nostrils daily. 09/10/20   Cirigliano, Jearld Lesch, DO  triamcinolone cream (KENALOG) 0.1 % APPLY 1 APPLICATION. TOPICALLY 2 (TWO) TIMES DAILY. 06/19/22   Janie Morning, NP    Current Outpatient Medications  Medication Sig Dispense Refill   buPROPion (WELLBUTRIN SR) 150 MG 12  hr tablet Take 1 tablet (150 mg total) by mouth every evening.     buPROPion (WELLBUTRIN XL) 300 MG 24 hr tablet TAKE 1 TABLET BY MOUTH EVERY DAY 90 tablet 0   carvedilol (COREG) 25 MG tablet TAKE 1 TABLET (25 MG TOTAL) BY MOUTH TWICE A DAY WITH MEALS 180 tablet 0   cetirizine (ZYRTEC) 10 MG tablet TAKE 1 TABLET BY MOUTH EVERY DAY 90 tablet 1   chlorthalidone (HYGROTON) 25 MG tablet TAKE 1 TABLET (25 MG TOTAL) BY MOUTH DAILY. 90 tablet 3   cloNIDine (CATAPRES - DOSED IN MG/24 HR) 0.3 mg/24hr patch Place 1 patch (0.3 mg total) onto the skin once a week. 12 patch 1   famotidine (PEPCID) 20 MG tablet TAKE 1 TABLET BY MOUTH TWICE A DAY 180 tablet 0   hydrALAZINE (APRESOLINE) 50 MG tablet  Take 1 tablet (50 mg total) by mouth in the morning and at bedtime. 180 tablet 3   spironolactone (ALDACTONE) 25 MG tablet Take 1 tablet (25 mg total) by mouth daily. 90 tablet 3   Vitamin D, Ergocalciferol, (DRISDOL) 1.25 MG (50000 UNIT) CAPS capsule Take 1 capsule (50,000 Units total) by mouth every 7 (seven) days. 12 capsule 1   zolpidem (AMBIEN) 10 MG tablet TAKE 1 TABLET BY MOUTH EVERY DAY AT BEDTIME AS NEEDED 30 tablet 1   albuterol (PROAIR HFA) 108 (90 Base) MCG/ACT inhaler Inhale 2 puffs into the lungs every 4 (four) hours as needed for wheezing or shortness of breath. 1 each 2   EPINEPHrine 0.3 mg/0.3 mL IJ SOAJ injection Inject 0.3 mg into the muscle as needed for anaphylaxis. 1 each 1   fluticasone (FLONASE) 50 MCG/ACT nasal spray Place 2 sprays into both nostrils daily. 16 g 0   triamcinolone cream (KENALOG) 0.1 % APPLY 1 APPLICATION. TOPICALLY 2 (TWO) TIMES DAILY. 30 g 0   Current Facility-Administered Medications  Medication Dose Route Frequency Provider Last Rate Last Admin   0.9 %  sodium chloride infusion  500 mL Intravenous Continuous Bill Yohn, Willaim Rayas, MD        Allergies as of 12/05/2023 - Review Complete 12/05/2023  Allergen Reaction Noted   Nifedipine Hives and Itching 05/25/2022   Valsartan Swelling 03/26/2022    Family History  Problem Relation Age of Onset   Stroke Mother    Hypertension Mother    Diabetes Mother        type 1   Heart disease Mother    Hypertension Father    Diabetes Father        Type 2   Cancer Maternal Grandmother        Ovarian   Heart disease Maternal Grandmother    Hypertension Maternal Grandmother    Diabetes Maternal Grandmother        Type 1   Kidney disease Maternal Grandmother    Diabetes Maternal Grandfather        Type 2   Other Maternal Grandfather        ITP   Hypotension Maternal Grandfather    Diabetes Paternal Grandmother        Type 2   Hypertension Paternal Grandmother    Cancer Paternal Grandfather         Bladder   Hypertension Paternal Grandfather    Colon cancer Neg Hx    Rectal cancer Neg Hx    Stomach cancer Neg Hx    Esophageal cancer Neg Hx     Social History   Socioeconomic History   Marital  status: Married    Spouse name: Gus Fuller   Number of children: Not on file   Years of education: Not on file   Highest education level: Bachelor's degree (e.g., BA, AB, BS)  Occupational History   Occupation: Runner, broadcasting/film/video    Comment: Owner  Tobacco Use   Smoking status: Some Days    Current packs/day: 0.25    Average packs/day: 0.3 packs/day for 20.0 years (5.0 ttl pk-yrs)    Types: Cigarettes    Passive exposure: Current   Smokeless tobacco: Never  Vaping Use   Vaping status: Never Used  Substance and Sexual Activity   Alcohol use: No   Drug use: No   Sexual activity: Yes    Partners: Male    Birth control/protection: None  Other Topics Concern   Not on file  Social History Narrative   Not on file   Social Drivers of Health   Financial Resource Strain: Low Risk  (10/11/2023)   Overall Financial Resource Strain (CARDIA)    Difficulty of Paying Living Expenses: Not hard at all  Food Insecurity: No Food Insecurity (10/11/2023)   Hunger Vital Sign    Worried About Running Out of Food in the Last Year: Never true    Ran Out of Food in the Last Year: Never true  Transportation Needs: No Transportation Needs (10/11/2023)   PRAPARE - Administrator, Civil Service (Medical): No    Lack of Transportation (Non-Medical): No  Physical Activity: Insufficiently Active (10/11/2023)   Exercise Vital Sign    Days of Exercise per Week: 2 days    Minutes of Exercise per Session: 20 min  Stress: No Stress Concern Present (10/11/2023)   [employer] of Occupational Health - Occupational Stress Questionnaire    Feeling of Stress : Not at all  Social Connections: Moderately Integrated (10/11/2023)   Social Connection and Isolation Panel [NHANES]    Frequency  of Communication with Friends and Family: More than three times a week    Frequency of Social Gatherings with Friends and Family: More than three times a week    Attends Religious Services: 1 to 4 times per year    Active Member of [employer] or Organizations: No    Attends Banker Meetings: Not on file    Marital Status: Married  Catering manager Violence: Not At Risk (09/15/2021)   Humiliation, Afraid, Rape, and Kick questionnaire    Fear of Current or Ex-Partner: No    Emotionally Abused: No    Physically Abused: No    Sexually Abused: No    Review of Systems: All other review of systems negative except as mentioned in the HPI.  Physical Exam: Vital signs BP (!) 146/87   Pulse (!) 51   Temp (!) 97.5 F (36.4 C) (Temporal)   Resp 11   Ht 5\' 7"  (1.702 m)   Wt 265 lb (120.2 kg)   LMP 11/21/2023 (Exact Date)   SpO2 100%   BMI 41.50 kg/m   General:   Alert,  Well-developed, pleasant and cooperative in NAD Lungs:  Clear throughout to auscultation.   Heart:  Regular rate and rhythm Abdomen:  Soft, nontender and nondistended.   Neuro/Psych:  Alert and cooperative. Normal mood and affect. A and O x 3  Harlin Rain, MD [facility]

## 2023-12-05 NOTE — Progress Notes (Signed)
 Called to room to assist during endoscopic procedure.  Patient ID and intended procedure confirmed with present staff. Received instructions for my participation in the procedure from the performing physician.

## 2023-12-05 NOTE — Progress Notes (Signed)
 Pt's states no medical or surgical changes since previsit or office visit.

## 2023-12-05 NOTE — Patient Instructions (Signed)
Resume previous diet and medications. Awaiting pathology results. Repeat Colonoscopy date to be determined based on pathology results.  Handout provided on:Colon polyps and Hemorrhoids  YOU HAD AN ENDOSCOPIC PROCEDURE TODAY AT THE [facility]:   Refer to the procedure report that was given to you for any specific questions about what was found during the examination.  If the procedure report does not answer your questions, please call your gastroenterologist to clarify.  If you requested that your care partner not be given the details of your procedure findings, then the procedure report has been included in a sealed envelope for you to review at your convenience later.  YOU SHOULD EXPECT: Some feelings of bloating in the abdomen. Passage of more gas than usual.  Walking can help get rid of the air that was put into your GI tract during the procedure and reduce the bloating. If you had a lower endoscopy (such as a colonoscopy or flexible sigmoidoscopy) you may notice spotting of blood in your stool or on the toilet paper. If you underwent a bowel prep for your procedure, you may not have a normal bowel movement for a few days.  Please Note:  You might notice some irritation and congestion in your nose or some drainage.  This is from the oxygen used during your procedure.  There is no need for concern and it should clear up in a day or so.  SYMPTOMS TO REPORT IMMEDIATELY:  Following lower endoscopy (colonoscopy or flexible sigmoidoscopy):  Excessive amounts of blood in the stool  Significant tenderness or worsening of abdominal pains  Swelling of the abdomen that is new, acute  Fever of 100F or higher  For urgent or emergent issues, a gastroenterologist can be reached at any hour by calling [phone]. Do not use MyChart messaging for urgent concerns.    DIET:  We do recommend a small meal at first, but then you may proceed to your regular diet.  Drink plenty of fluids but you  should avoid alcoholic beverages for 24 hours.  ACTIVITY:  You should plan to take it easy for the rest of today and you should NOT DRIVE or use heavy machinery until tomorrow (because of the sedation medicines used during the test).    FOLLOW UP: Our staff will call the number listed on your records the next business day following your procedure.  We will call around 7:15- 8:00 am to check on you and address any questions or concerns that you may have regarding the information given to you following your procedure. If we do not reach you, we will leave a message.     If any biopsies were taken you will be contacted by phone or by letter within the next 1-3 weeks.  Please call us at [phone] if you have not heard about the biopsies in 3 weeks.    SIGNATURES/CONFIDENTIALITY: You and/or your care partner have signed paperwork which will be entered into your electronic medical record.  These signatures attest to the fact that that the information above on your After Visit Summary has been reviewed and is understood.  Full responsibility of the confidentiality of this discharge information lies with you and/or your care-partner. 

## 2023-12-05 NOTE — Op Note (Signed)
 [facility] Patient Name: Amanda Barrett Procedure Date: 12/05/2023 9:11 AM MRN: [id number] Endoscopist: Viviann Spare P. Adela Lank , MD, [id number] Age: 48 Referring MD:  Date of Birth: Sep 15, 1976 Gender: Female Account #: [id number] Procedure:                Colonoscopy Indications:              Screening for colorectal malignant neoplasm, This                            is the patient's first colonoscopy Medicines:                Monitored Anesthesia Care Procedure:                Pre-Anesthesia Assessment:                           - Prior to the procedure, a History and Physical                            was performed, and patient medications and                            allergies were reviewed. The patient's tolerance of                            previous anesthesia was also reviewed. The risks                            and benefits of the procedure and the sedation                            options and risks were discussed with the patient.                            All questions were answered, and informed consent                            was obtained. Prior Anticoagulants: The patient has                            taken no anticoagulant or antiplatelet agents. ASA                            Grade Assessment: III - A patient with severe                            systemic disease. After reviewing the risks and                            benefits, the patient was deemed in satisfactory                            condition to undergo the procedure.  After obtaining informed consent, the colonoscope                            was passed under direct vision. Throughout the                            procedure, the patient's blood pressure, pulse, and                            oxygen saturations were monitored continuously. The                            Colonoscope was introduced through the anus and                            advanced to the  the cecum, identified by                            appendiceal orifice and ileocecal valve. The                            colonoscopy was performed without difficulty. The                            patient tolerated the procedure well. The quality                            of the bowel preparation was adequate. The                            ileocecal valve, appendiceal orifice, and rectum                            were photographed. Scope In: 9:19:11 AM Scope Out: 9:49:27 AM Scope Withdrawal Time: 0 hours 25 minutes 41 seconds  Total Procedure Duration: 0 hours 30 minutes 16 seconds  Findings:                 The perianal and digital rectal examinations were                            normal.                           A 15 mm polyp was found in the hepatic flexure. The                            polyp was flat. The polyp was removed with a cold                            snare. Resection and retrieval were complete.                           A 5 to 6 mm polyp was found in the transverse  colon. The polyp was semi-pedunculated. The polyp                            was removed with a cold snare. Resection and                            retrieval were complete.                           An 8 to 10 mm polyp was found in the sigmoid colon.                            The polyp was sessile. The polyp was removed with a                            cold snare. Resection and retrieval were complete.                           Multiple diverticula were found in the sigmoid                            colon.                           Anal papilla(e) were hypertrophied.                           Internal hemorrhoids were found during                            retroflexion. The hemorrhoids were small.                           There was looping of the colon. pressure utilized                            to achieve cecal intubation. The exam was otherwise                             without abnormality. Complications:            No immediate complications. Estimated blood loss:                            Minimal. Estimated Blood Loss:     Estimated blood loss was minimal. Impression:               - One 15 mm polyp at the hepatic flexure, removed                            with a cold snare. Resected and retrieved.                           - One 5 to 6 mm polyp in the transverse colon,  removed with a cold snare. Resected and retrieved.                           - One 8 to 10 mm polyp in the sigmoid colon,                            removed with a cold snare. Resected and retrieved.                           - Diverticulosis in the sigmoid colon.                           - Anal papilla(e) were hypertrophied.                           - Internal hemorrhoids.                           - Looping of the colon                           - The examination was otherwise normal. Recommendation:           - Patient has a contact number available for                            emergencies. The signs and symptoms of potential                            delayed complications were discussed with the                            patient. Return to normal activities tomorrow.                            Written discharge instructions were provided to the                            patient.                           - Resume previous diet.                           - Continue present medications.                           - Await pathology results. Viviann Spare P. Grasiela Jonsson, MD 12/05/2023 9:55:03 AM This report has been signed electronically.

## 2023-12-05 NOTE — Progress Notes (Signed)
 Report given to PACU, vss

## 2023-12-06 ENCOUNTER — Telehealth: Payer: Self-pay

## 2023-12-06 NOTE — Telephone Encounter (Signed)
  Follow up Call-     12/05/2023    8:09 AM  Call back number  Post procedure Call Back phone  # [phone]  Permission to leave phone message Yes    Post op call attempted, no answer, left VM.

## 2023-12-07 ENCOUNTER — Other Ambulatory Visit: Payer: Self-pay | Admitting: Physician Assistant

## 2023-12-07 ENCOUNTER — Encounter: Payer: Self-pay | Admitting: Gastroenterology

## 2023-12-07 DIAGNOSIS — F331 Major depressive disorder, recurrent, moderate: Secondary | ICD-10-CM

## 2023-12-07 DIAGNOSIS — I10 Essential (primary) hypertension: Secondary | ICD-10-CM

## 2023-12-07 DIAGNOSIS — R21 Rash and other nonspecific skin eruption: Secondary | ICD-10-CM

## 2023-12-07 LAB — SURGICAL PATHOLOGY

## 2023-12-08 ENCOUNTER — Telehealth (HOSPITAL_BASED_OUTPATIENT_CLINIC_OR_DEPARTMENT_OTHER): Payer: Self-pay

## 2023-12-08 ENCOUNTER — Encounter (HOSPITAL_BASED_OUTPATIENT_CLINIC_OR_DEPARTMENT_OTHER): Payer: Self-pay | Admitting: Family

## 2023-12-08 ENCOUNTER — Other Ambulatory Visit (HOSPITAL_COMMUNITY): Payer: Self-pay

## 2023-12-08 ENCOUNTER — Telehealth: Payer: Self-pay | Admitting: Pharmacy Technician

## 2023-12-08 ENCOUNTER — Ambulatory Visit (HOSPITAL_BASED_OUTPATIENT_CLINIC_OR_DEPARTMENT_OTHER): Payer: BC Managed Care – PPO | Admitting: Family

## 2023-12-08 VITALS — BP 106/69 | HR 66 | Ht 67.0 in | Wt 251.0 lb

## 2023-12-08 DIAGNOSIS — R002 Palpitations: Secondary | ICD-10-CM | POA: Diagnosis not present

## 2023-12-08 DIAGNOSIS — Z720 Tobacco use: Secondary | ICD-10-CM

## 2023-12-08 DIAGNOSIS — G4733 Obstructive sleep apnea (adult) (pediatric): Secondary | ICD-10-CM

## 2023-12-08 DIAGNOSIS — I10 Essential (primary) hypertension: Secondary | ICD-10-CM | POA: Diagnosis not present

## 2023-12-08 MED ORDER — CLONIDINE 0.2 MG/24HR TD PTWK: 0.2000 mg | MEDICATED_PATCH | TRANSDERMAL | 3 refills | Status: DC

## 2023-12-08 NOTE — Patient Instructions (Signed)
 Medication Instructions:  Your physician has recommended you make the following change in your medication:   Change: clonidine patch 0.2mg    We will send a message to the prior auth team to see about getting Zepbound    Follow-Up: 6 months in [facility] with Dr. Duke Salvia or Gillian Shields, NP   Special Instructions:  We will check on BP in 2-3 weeks

## 2023-12-08 NOTE — Telephone Encounter (Signed)
 Pharmacy Patient Advocate Encounter   Received notification from Pt Calls Messages that prior authorization for zepbound is required/requested.   Insurance verification completed.   The patient is insured through [employer]    .   Per test claim: PA required; PA submitted to above mentioned insurance via CoverMyMeds Key/confirmation #/EOC [id number] Status is pending

## 2023-12-08 NOTE — Progress Notes (Signed)
 [facility] Assessment:    Date:  12/08/2023   ID:  Amanda Barrett, DOB 05-17-1976, MRN [id number]  PCP:  Marianne Sofia, PA-C  Cardiologist:  Chilton Si, MD  Nephrologist:  Referring MD: Marianne Sofia, PA-C   CC: Hypertension  History of Present Illness:    Amanda Barrett is a 48 y.o. female with a hx of hypertension, anemia, vitamin D deficiency, depression, tobacco use, OSA here to follow up care in the [facility].   Amanda Barrett was diagnosed with hypertension when she was 48 years old. Amlodipine previously stopped due to gum issues. 01/2022 developed hives including having her face blowing up having to use epipen with ED visit 03/10/22 for angioedema. Her Nifedipine dose was increased - hives went away for a few days then as soon as Prednisone  dose completed has recurrent hives and Nifedipine discontinued. 04/2022 Clonidine initiated which took some time to adjust to. Frequent Hydralazine dosing previously dizzy.  Seen 08/2022 noting clondine and hydralazine combination made her drowsy for a few hours. Hydrochlorothiazide switched to chlorthalidone and Clonidine switched to patches.   Seen 10/2022 with BP in [facility] 110-160. Unfortunately insurance denied renal denervation.  Seen 02/14/23 noting more often taking two doses of Hydralazine rather than three as missed midday dose. At that time, potassium supplement stopped and Spironolactone initiated.   Last seen 06/03/2023.  BP at home 110s-140s.  She was having difficulty with full facemask and encouraged to trial nose mask for CPAP which she had at home.  She presents today for follow up.  Not wearing CPAP - previously tolerated but got out of habit of using and difficulty now with full face mask. Does have nose mask to trial.. Had colonscopy Monday and had yet had prior evening or morning medication and BP was 122/67. Interested in reducing antihypertensive regimen. Has had successful weight loss with  dietary changes. Reports palpitations were previously infrequent but last week were more frequent more days then not. They last a couple seconds to minutes. Describes as a "fluttering". No excessive caffeine, endorses staying well hydrated.   Previous antihypertensives: Valsartan - allergic reaction (swelling) 01/2022 Nifedipine - allergic reaction (hives, itching) trialed 09/2018, 09/2021, 11/2021 - eventually discontinued 03/26/22 Amlodipine - gingival hyperplasia  Past Medical History:  Diagnosis Date   Anemia    Pernicious   Angio-edema    Hypertension    Morbid (severe) obesity due to excess calories (HCC) 12/05/2023   bmi 41.50   Recurrent upper respiratory infection (URI)    Resistant hypertension 09/14/2018   Urticaria     Past Surgical History:  Procedure Laterality Date   ADENOIDECTOMY     MYOMECTOMY     Uterine Fibroids   SINOSCOPY     SINUS EXPLORATION     TONSILLECTOMY      Current Medications: Current Meds  Medication Sig   albuterol (PROAIR HFA) 108 (90 Base) MCG/ACT inhaler Inhale 2 puffs into the lungs every 4 (four) hours as needed for wheezing or shortness of breath.   buPROPion (WELLBUTRIN SR) 150 MG 12 hr tablet TAKE 1 TABLET BY MOUTH EVERY DAY   buPROPion (WELLBUTRIN XL) 300 MG 24 hr tablet TAKE 1 TABLET BY MOUTH EVERY DAY   carvedilol (COREG) 25 MG tablet TAKE 1 TABLET (25 MG TOTAL) BY MOUTH TWICE A DAY WITH MEALS   cetirizine (ZYRTEC) 10 MG tablet TAKE 1 TABLET BY MOUTH EVERY DAY   chlorthalidone (HYGROTON) 25 MG tablet TAKE 1 TABLET (25  MG TOTAL) BY MOUTH DAILY.   cloNIDine (CATAPRES - DOSED IN MG/24 HR) 0.3 mg/24hr patch Place 1 patch (0.3 mg total) onto the skin once a week.   EPINEPHrine 0.3 mg/0.3 mL IJ SOAJ injection Inject 0.3 mg into the muscle as needed for anaphylaxis.   famotidine (PEPCID) 20 MG tablet TAKE 1 TABLET BY MOUTH TWICE A DAY   fluticasone (FLONASE) 50 MCG/ACT nasal spray Place 2 sprays into both nostrils daily.   hydrALAZINE  (APRESOLINE) 50 MG tablet Take 1 tablet (50 mg total) by mouth in the morning and at bedtime.   spironolactone (ALDACTONE) 25 MG tablet Take 1 tablet (25 mg total) by mouth daily.   triamcinolone cream (KENALOG) 0.1 % APPLY 1 APPLICATION. TOPICALLY 2 (TWO) TIMES DAILY.   Vitamin D, Ergocalciferol, (DRISDOL) 1.25 MG (50000 UNIT) CAPS capsule Take 1 capsule (50,000 Units total) by mouth every 7 (seven) days.   zolpidem (AMBIEN) 10 MG tablet TAKE 1 TABLET BY MOUTH EVERY DAY AT BEDTIME AS NEEDED     Allergies:   Nifedipine and Valsartan   Social History   Socioeconomic History   Marital status: Married    Spouse name: Gus Corbo   Number of children: Not on file   Years of education: Not on file   Highest education level: Bachelor's degree (e.g., BA, AB, BS)  Occupational History   Occupation: Runner, broadcasting/film/video    Comment: Owner  Tobacco Use   Smoking status: Some Days    Current packs/day: 0.25    Average packs/day: 0.3 packs/day for 20.0 years (5.0 ttl pk-yrs)    Types: Cigarettes    Passive exposure: Current   Smokeless tobacco: Never  Vaping Use   Vaping status: Never Used  Substance and Sexual Activity   Alcohol use: No   Drug use: No   Sexual activity: Yes    Partners: Male    Birth control/protection: None  Other Topics Concern   Not on file  Social History Narrative   Not on file   Social Drivers of Health   Financial Resource Strain: Low Risk  (10/11/2023)   Overall Financial Resource Strain (CARDIA)    Difficulty of Paying Living Expenses: Not hard at all  Food Insecurity: No Food Insecurity (10/11/2023)   Hunger Vital Sign    Worried About Running Out of Food in the Last Year: Never true    Ran Out of Food in the Last Year: Never true  Transportation Needs: No Transportation Needs (10/11/2023)   PRAPARE - Administrator, Civil Service (Medical): No    Lack of Transportation (Non-Medical): No  Physical Activity: Insufficiently Active  (10/11/2023)   Exercise Vital Sign    Days of Exercise per Week: 2 days    Minutes of Exercise per Session: 20 min  Stress: No Stress Concern Present (10/11/2023)   [employer] of Occupational Health - Occupational Stress Questionnaire    Feeling of Stress : Not at all  Social Connections: Moderately Integrated (10/11/2023)   Social Connection and Isolation Panel [NHANES]    Frequency of Communication with Friends and Family: More than three times a week    Frequency of Social Gatherings with Friends and Family: More than three times a week    Attends Religious Services: 1 to 4 times per year    Active Member of [employer] or Organizations: No    Attends Engineer, structural: Not on file    Marital Status: Married     Family  History: The patient's family history includes Cancer in her maternal grandmother and paternal grandfather; Diabetes in her father, maternal grandfather, maternal grandmother, mother, and paternal grandmother; Heart disease in her maternal grandmother and mother; Hypertension in her father, maternal grandmother, mother, paternal grandfather, and paternal grandmother; Hypotension in her maternal grandfather; Kidney disease in her maternal grandmother; Other in her maternal grandfather; Stroke in her mother. There is no history of Colon cancer, Rectal cancer, Stomach cancer, or Esophageal cancer.  ROS:   Please see the history of present illness.     All other systems reviewed and are negative.  EKGs/Labs/Other Studies Reviewed:    EKG Interpretation Date/Time:  Thursday December 08 2023 11:12:27 EST Ventricular Rate:  65 PR Interval:  160 QRS Duration:  98 QT Interval:  434 QTC Calculation: 451 R Axis:   26  Text Interpretation: Normal sinus rhythm No acute changes Confirmed by Gillian Shields ([id number]) on 12/08/2023 11:17:11 AM    11/09/18 VAS US renal  Possible bilateral renal artery stenosis but accuracy of study limited by  vessel  tortuosity.   Consideration of CT Angiogram or contrast angiogram if pt renal function  is  reasonable and still high index of suspicion.    12/18/18 CT angio abdomen pelvis IMPRESSION: VASCULAR   1. No evidence of renal artery stenosis or other lesion to suggest a renovascular component of hypertension.   NON-VASCULAR:.   1. Fatty liver. 2. Scattered descending and sigmoid diverticula. 3. Degenerative disc disease L4-S1.  Recent Labs: 10/12/2023: ALT 38; BUN 17; Creatinine, Ser 1.21; Hemoglobin 15.0; Platelets 235; Potassium 3.4; Sodium 140; TSH 3.110   Recent Lipid Panel    Component Value Date/Time   CHOL 171 10/12/2023 1012   TRIG 90 10/12/2023 1012   HDL 56 10/12/2023 1012   CHOLHDL 3.1 10/12/2023 1012   CHOLHDL 3 10/18/2018 1027   VLDL 14.0 10/18/2018 1027   LDLCALC 98 10/12/2023 1012    Physical Exam:   VS:  BP 106/69   Pulse 66   Ht 5\' 7"  (1.702 m)   Wt 251 lb (113.9 kg)   LMP 11/21/2023 (Exact Date)   SpO2 97%   BMI 39.31 kg/m  , BMI Body mass index is 39.31 kg/m. GENERAL:  Well appearing, overweight HEENT: Pupils equal round and reactive, fundi not visualized, oral mucosa unremarkable NECK:  No jugular venous distention, waveform within normal limits, carotid upstroke brisk and symmetric, no bruits, no thyromegaly LYMPHATICS:  No cervical adenopathy LUNGS:  Clear to auscultation bilaterally HEART:  RRR.  PMI not displaced or sustained,S1 and S2 within normal limits, no S3, no S4, no clicks, no rubs, no murmurs ABD:  Flat, nontender, nondistended  EXT:  2 plus pulses throughout, no edema, no cyanosis no clubbing SKIN:  No rashes no nodules NEURO:  Cranial nerves II through XII grossly intact, motor grossly intact throughout PSYCH:  Cognitively intact, oriented to person place and time   ASSESSMENT/PLAN:    Hypertension - Relative hypotension likely due to weight loss. Reduce Clonidine from 0.3 to 0.2mg  patch weekly. Check in via Mychart in a few weeks and could  consider further reducing dose. We will continue carvedilol 25mg  BID, Chlorthalidone 25mg  daily, hydralazine 50mg  BID, Spironolactone 25mg  daily at present doses. Would hesitate to use ACE/ARB/Arni due to previous angioedema with valsartan.   Insurance previously unfortunately denied RDN OSA treated as below Prior renal duplex no stenosis Prior cortisol, renin-aldosterone, TSH, catecholamines, metanephrines unremarkable.  OSA - CPAP compliance encouraged. Agreeable to try nasal  mask as not using currently.  Will submit prior authorization for Zepbound for sleep apnea.  Prediabetes / Obesity - Prior A1c one year ago 5.7 and most recent 10/12/23 of 5.5 with resolution of prediabetes. Congratulated on 14 lbs weight loss with dietary changes. Weight loss via diet and exercise encouraged. Discussed the impact being overweight would have on cardiovascular risk.   Depression-follows with primary care provider  Palpitations - Reports intermittent palpitations with sensation of "fluttering" 10/2023 normal TSH, K mildly low at 3.4. Discussed ZIO monitor, prefers to monitor symptoms for now. Encouraged to hydrate well, limit caffeine, manage stress well.  Tobacco use- Smoking cessation encouraged. Has reduced from prior which is great. Recommend utilization of [id number].   Screening for Secondary Hypertension:     07/01/2022    1:31 PM  Causes  Drugs/Herbals Screened  Renovascular HTN Screened     - Comments 12/2018 negative CT  Sleep Apnea Screened     - Comments 06/2022 [facility] sleep study  Thyroid Disease Screened     - Comments 02/2022 normal TSH  Hyperaldosteronism Screened     - Comments 11/2018 normal renin aldosterone  Pheochromocytoma Screened     - Comments 06/2022 labs ordered  Cushing's Syndrome Screened     - Comments 06/2022 labs ordered  Coarctation of the Aorta Not Screened  Compliance Screened     - Comments 06/2022 Hydralaine BID instead of QID    Relevant Labs/Studies:     Latest Ref Rng & Units 10/12/2023   10:12 AM 06/03/2023    1:01 PM 02/24/2023    1:45 PM  Basic Labs  Sodium 134 - 144 mmol/L 140  140  141   Potassium 3.5 - 5.2 mmol/L 3.4  4.1  3.7   Creatinine 0.57 - 1.00 mg/dL 4.09  8.11  9.14        Latest Ref Rng & Units 10/12/2023   10:12 AM 12/10/2022   10:47 AM  Thyroid   TSH 0.450 - 4.500 uIU/mL 3.110  2.170        Latest Ref Rng & Units 11/06/2018    3:48 PM  Renin/Aldosterone   Aldosterone  ng/dL 21        Latest Ref Rng & Units 08/18/2022   10:57 AM  Metanephrines/Catecholamines   Epinephrine 0 - 62 pg/mL 18   Norepinephrine 0 - 874 pg/mL 266   Dopamine 0 - 48 pg/mL <30   Metanephrines 0.0 - 88.0 pg/mL <25.0   Normetanephrines  0.0 - 218.9 pg/mL 36.0           11/09/2018    9:18 AM  Renovascular   Renal Artery [place] Completed Yes     Disposition:    FU with PharmD/MD/APP in 6 months   Medication Adjustments/Labs and Tests Ordered: Current medicines are reviewed at length with the patient today.  Concerns regarding medicines are outlined above.  Orders Placed This Encounter  Procedures   EKG 12-Lead   No orders of the defined types were placed in this encounter.    Signed, Alver Sorrow, NP  12/08/2023 11:28 AM    [facility]

## 2023-12-08 NOTE — Telephone Encounter (Signed)
 Hi team can we please submit a PA for Zepbound for Sleep Apnea

## 2023-12-09 NOTE — Telephone Encounter (Signed)
 Pharmacy Patient Advocate Encounter  Received notification from [facility]  that Prior Authorization for zepbound has been DENIED.  See denial reason below.  Will attach denial letter to Media tab once received. Request Reference Number: [id number]. ZEPBOUND INJ 2.5/0.5 is denied due to Plan Exclusion.   PA #/Case ID/Reference #: [id number]

## 2023-12-09 NOTE — Telephone Encounter (Signed)
 FYI

## 2023-12-22 ENCOUNTER — Other Ambulatory Visit: Payer: Self-pay | Admitting: Physician Assistant

## 2023-12-22 DIAGNOSIS — F5101 Primary insomnia: Secondary | ICD-10-CM

## 2023-12-25 ENCOUNTER — Encounter: Payer: Self-pay | Admitting: Physician Assistant

## 2023-12-29 ENCOUNTER — Encounter (HOSPITAL_BASED_OUTPATIENT_CLINIC_OR_DEPARTMENT_OTHER): Payer: Self-pay

## 2024-02-22 ENCOUNTER — Other Ambulatory Visit: Payer: Self-pay | Admitting: Physician Assistant

## 2024-02-22 DIAGNOSIS — F5101 Primary insomnia: Secondary | ICD-10-CM

## 2024-03-06 ENCOUNTER — Telehealth: Payer: Self-pay | Admitting: Family

## 2024-03-06 DIAGNOSIS — E282 Polycystic ovarian syndrome: Secondary | ICD-10-CM | POA: Insufficient documentation

## 2024-03-06 DIAGNOSIS — Z01419 Encounter for gynecological examination (general) (routine) without abnormal findings: Secondary | ICD-10-CM | POA: Diagnosis not present

## 2024-03-06 NOTE — Telephone Encounter (Signed)
 Called and spoke to pt. Pt called to inform office of elevated BP. Went over medications, pt has not taken Clonidine  patch since last HTN OV 12/2023. Advised pt to check BP 2 hours after taking medications, keep a log and f/u in 1-2 weeks with readings. Pt verbalized understanding and was very thankful for call. Will route to Otho Blitz, NP for review.

## 2024-03-06 NOTE — Telephone Encounter (Signed)
 Pt c/o BP issue: STAT if pt c/o blurred vision, one-sided weakness or slurred speech.  STAT if BP is GREATER than 180/120 TODAY.  STAT if BP is LESS than 90/60 and SYMPTOMATIC TODAY  1. What is your BP concern? Pt called in stating her bp has been high, she would like to make an appt to be seen in HTN clinic   2. Have you taken any BP medication today? Yes   3. What are your last 5 BP readings?  158/110 - today w/ PCP today   4. Are you having any other symptoms (ex. Dizziness, headache, blurred vision, passed out)? No

## 2024-03-08 NOTE — Telephone Encounter (Signed)
 Has she not been taking Clonidine  at all? We reduced at last visit from 0.3mg  to 0.2mg  patch, but did not discontinue.   If BP persistently elevated, resume Clonidine  patch at 0.1mg  weekly.   Isolde Skaff S Daeja Helderman, NP

## 2024-03-08 NOTE — Telephone Encounter (Signed)
 FYI

## 2024-03-10 ENCOUNTER — Other Ambulatory Visit: Payer: Self-pay | Admitting: Physician Assistant

## 2024-03-10 ENCOUNTER — Other Ambulatory Visit (HOSPITAL_BASED_OUTPATIENT_CLINIC_OR_DEPARTMENT_OTHER): Payer: Self-pay | Admitting: Cardiovascular Disease

## 2024-03-10 DIAGNOSIS — R21 Rash and other nonspecific skin eruption: Secondary | ICD-10-CM

## 2024-03-10 DIAGNOSIS — F331 Major depressive disorder, recurrent, moderate: Secondary | ICD-10-CM

## 2024-03-11 ENCOUNTER — Other Ambulatory Visit: Payer: Self-pay | Admitting: Physician Assistant

## 2024-03-11 DIAGNOSIS — I10 Essential (primary) hypertension: Secondary | ICD-10-CM

## 2024-03-25 ENCOUNTER — Other Ambulatory Visit: Payer: Self-pay | Admitting: Physician Assistant

## 2024-03-25 DIAGNOSIS — R21 Rash and other nonspecific skin eruption: Secondary | ICD-10-CM

## 2024-03-25 DIAGNOSIS — E559 Vitamin D deficiency, unspecified: Secondary | ICD-10-CM

## 2024-03-25 DIAGNOSIS — E876 Hypokalemia: Secondary | ICD-10-CM

## 2024-03-25 DIAGNOSIS — F331 Major depressive disorder, recurrent, moderate: Secondary | ICD-10-CM

## 2024-03-25 DIAGNOSIS — I10 Essential (primary) hypertension: Secondary | ICD-10-CM

## 2024-04-08 ENCOUNTER — Other Ambulatory Visit: Payer: Self-pay | Admitting: Medical Genetics

## 2024-04-09 ENCOUNTER — Other Ambulatory Visit (HOSPITAL_COMMUNITY)

## 2024-04-12 ENCOUNTER — Ambulatory Visit: Payer: BC Managed Care – PPO | Admitting: Physician Assistant

## 2024-04-12 ENCOUNTER — Other Ambulatory Visit (HOSPITAL_COMMUNITY)
Admission: RE | Admit: 2024-04-12 | Discharge: 2024-04-12 | Disposition: A | Discharge status: Home / Self Care | Payer: Self-pay | Source: Ambulatory Visit | Attending: Medical Genetics | Admitting: Medical Genetics

## 2024-04-12 ENCOUNTER — Encounter: Payer: Self-pay | Admitting: Physician Assistant

## 2024-04-12 VITALS — BP 124/86 | HR 60 | Temp 97.8°F | Ht 67.0 in | Wt 249.8 lb

## 2024-04-12 DIAGNOSIS — O021 Missed abortion: Secondary | ICD-10-CM | POA: Insufficient documentation

## 2024-04-12 DIAGNOSIS — F331 Major depressive disorder, recurrent, moderate: Secondary | ICD-10-CM

## 2024-04-12 DIAGNOSIS — R7301 Impaired fasting glucose: Secondary | ICD-10-CM

## 2024-04-12 DIAGNOSIS — K219 Gastro-esophageal reflux disease without esophagitis: Secondary | ICD-10-CM

## 2024-04-12 DIAGNOSIS — I10 Essential (primary) hypertension: Secondary | ICD-10-CM | POA: Diagnosis not present

## 2024-04-12 DIAGNOSIS — E559 Vitamin D deficiency, unspecified: Secondary | ICD-10-CM | POA: Diagnosis not present

## 2024-04-12 DIAGNOSIS — Z23 Encounter for immunization: Secondary | ICD-10-CM | POA: Diagnosis not present

## 2024-04-12 DIAGNOSIS — O039 Complete or unspecified spontaneous abortion without complication: Secondary | ICD-10-CM | POA: Insufficient documentation

## 2024-04-12 NOTE — Progress Notes (Signed)
 Established Patient Office Visit  Subjective:  Patient ID: Amanda Barrett, female    DOB: [birth year]  Age: 48 y.o. MRN: [id number]  CC:  Chief Complaint  Patient presents with   Medical Management of Chronic Issues    HPI Amanda Barrett presents for chronic follow up  Pt with history of hypertension.  She has had issues since age 66.  She currently is following with the blood pressure clinic in [place] because she has had uncontrolled hypertension that has been resistant to medications and pt also allergic to a few bp medications.  She is in a study currently that is monitoring her bp readings daily.  She has had her kidneys evaluated in the past.  Currently she is trying to be approved for renal denervation Her current bp meds include coreg , chlorthalidone , aldactone , hydralazine  - she had been taking clonidine  but bp started running low and she stopped this medication BP stable at 124/86  Pt with history of mild depression - states symptoms stable on wellbutrin  XL 300mg  qd with wellbutrin  150mg  qd  She has history of allergic rhinitis and takes zyrtec , flonase  and pepcid   Pt with history of insomnia - takes ambien  10mg  as needed which works well for her  Pt with history of Vit D def - taking weekly supplement and due for labwork   Pt would like pneumonia vaccine  Past Medical History:  Diagnosis Date   Anemia    Pernicious   Angio-edema    Hypertension    Morbid (severe) obesity due to excess calories (HCC) 12/05/2023   bmi 41.50   Recurrent upper respiratory infection (URI)    Resistant hypertension 09/14/2018   Urticaria     Past Surgical History:  Procedure Laterality Date   ADENOIDECTOMY     MYOMECTOMY     Uterine Fibroids   SINOSCOPY     SINUS EXPLORATION     TONSILLECTOMY      Family History  Problem Relation Age of Onset   Stroke Mother    Hypertension Mother    Diabetes Mother        type 1   Heart disease Mother    Hypertension Father     Diabetes Father        Type 2   Cancer Maternal Grandmother        Ovarian   Heart disease Maternal Grandmother    Hypertension Maternal Grandmother    Diabetes Maternal Grandmother        Type 1   Kidney disease Maternal Grandmother    Diabetes Maternal Grandfather        Type 2   Other Maternal Grandfather        ITP   Hypotension Maternal Grandfather    Diabetes Paternal Grandmother        Type 2   Hypertension Paternal Grandmother    Cancer Paternal Grandfather        Bladder   Hypertension Paternal Grandfather    Colon cancer Neg Hx    Rectal cancer Neg Hx    Stomach cancer Neg Hx    Esophageal cancer Neg Hx     Social History   Socioeconomic History   Marital status: Married    Spouse name: Gus Ramson   Number of children: Not on file   Years of education: Not on file   Highest education level: Bachelor's degree (e.g., BA, AB, BS)  Occupational History   Occupation: Runner, broadcasting/film/video    Comment: Owner  Tobacco Use   Smoking status: Some Days    Current packs/day: 0.25    Average packs/day: 0.3 packs/day for 20.0 years (5.0 ttl pk-yrs)    Types: Cigarettes    Passive exposure: Current   Smokeless tobacco: Never  Vaping Use   Vaping status: Never Used  Substance and Sexual Activity   Alcohol use: No   Drug use: No   Sexual activity: Yes    Partners: Male    Birth control/protection: None  Other Topics Concern   Not on file  Social History Narrative   Not on file   Social Drivers of Health   Financial Resource Strain: Low Risk  (04/05/2024)   Overall Financial Resource Strain (CARDIA)    Difficulty of Paying Living Expenses: Not hard at all  Food Insecurity: No Food Insecurity (04/05/2024)   Hunger Vital Sign    Worried About Running Out of Food in the Last Year: Never true    Ran Out of Food in the Last Year: Never true  Transportation Needs: No Transportation Needs (04/05/2024)   PRAPARE - Administrator, Civil Service  (Medical): No    Lack of Transportation (Non-Medical): No  Physical Activity: Insufficiently Active (04/05/2024)   Exercise Vital Sign    Days of Exercise per Week: 4 days    Minutes of Exercise per Session: 20 min  Stress: No Stress Concern Present (04/05/2024)   [employer] of Occupational Health - Occupational Stress Questionnaire    Feeling of Stress: Only a little  Social Connections: Moderately Integrated (04/05/2024)   Social Connection and Isolation Panel    Frequency of Communication with Friends and Family: More than three times a week    Frequency of Social Gatherings with Friends and Family: More than three times a week    Attends Religious Services: 1 to 4 times per year    Active Member of [employer] or Organizations: No    Attends Banker Meetings: Not on file    Marital Status: Married  Catering manager Violence: Not At Risk (09/15/2021)   Humiliation, Afraid, Rape, and Kick questionnaire    Fear of Current or Ex-Partner: No    Emotionally Abused: No    Physically Abused: No    Sexually Abused: No     Current Outpatient Medications:    albuterol  (PROAIR  HFA) 108 (90 Base) MCG/ACT inhaler, Inhale 2 puffs into the lungs every 4 (four) hours as needed for wheezing or shortness of breath., Disp: 1 each, Rfl: 2   buPROPion  (WELLBUTRIN  SR) 150 MG 12 hr tablet, TAKE 1 TABLET BY MOUTH EVERY DAY, Disp: 90 tablet, Rfl: 0   buPROPion  (WELLBUTRIN  XL) 300 MG 24 hr tablet, TAKE 1 TABLET BY MOUTH EVERY DAY, Disp: 90 tablet, Rfl: 0   carvedilol  (COREG ) 25 MG tablet, TAKE 1 TABLET (25 MG TOTAL) BY MOUTH TWICE A DAY WITH MEALS, Disp: 180 tablet, Rfl: 0   cetirizine  (ZYRTEC ) 10 MG tablet, TAKE 1 TABLET BY MOUTH EVERY DAY, Disp: 90 tablet, Rfl: 1   chlorthalidone  (HYGROTON ) 25 MG tablet, TAKE 1 TABLET (25 MG TOTAL) BY MOUTH DAILY., Disp: 90 tablet, Rfl: 3   EPINEPHrine  0.3 mg/0.3 mL IJ SOAJ injection, Inject 0.3 mg into the muscle as needed for anaphylaxis., Disp: 1 each, Rfl:  1   famotidine  (PEPCID ) 20 MG tablet, TAKE 1 TABLET BY MOUTH TWICE A DAY, Disp: 180 tablet, Rfl: 0   fluticasone  (FLONASE ) 50 MCG/ACT nasal spray, Place 2 sprays into both nostrils daily.,  Disp: 16 g, Rfl: 0   hydrALAZINE  (APRESOLINE ) 50 MG tablet, Take 1 tablet (50 mg total) by mouth in the morning and at bedtime., Disp: 180 tablet, Rfl: 3   spironolactone  (ALDACTONE ) 25 MG tablet, TAKE 1 TABLET (25 MG TOTAL) BY MOUTH DAILY., Disp: 90 tablet, Rfl: 3   triamcinolone  cream (KENALOG ) 0.1 %, APPLY 1 APPLICATION. TOPICALLY 2 (TWO) TIMES DAILY., Disp: 30 g, Rfl: 0   Vitamin D , Ergocalciferol , (DRISDOL ) 1.25 MG (50000 UNIT) CAPS capsule, TAKE 1 CAPSULE (50,000 UNITS TOTAL) BY MOUTH EVERY 7 (SEVEN) DAYS, Disp: 12 capsule, Rfl: 1   zolpidem  (AMBIEN ) 10 MG tablet, TAKE 1 TABLET BY MOUTH EVERY DAY AT BEDTIME AS NEEDED, Disp: 30 tablet, Rfl: 1   Allergies  Allergen Reactions   Nifedipine  Hives and Itching   Valsartan  Swelling    CONSTITUTIONAL: Negative for chills, fatigue, fever, unintentional weight gain and unintentional weight loss.  E/N/T: Negative for ear pain, nasal congestion and sore throat.  CARDIOVASCULAR: Negative for chest pain, dizziness, palpitations and pedal edema.  RESPIRATORY: Negative for recent cough and dyspnea.  GASTROINTESTINAL: Negative for abdominal pain, acid reflux symptoms, constipation, diarrhea, nausea and vomiting.  MSK: Negative for arthralgias and myalgias.  INTEGUMENTARY: Negative for rash.  NEUROLOGICAL: Negative for dizziness and headaches.  PSYCHIATRIC: Negative for sleep disturbance and to question depression screen.  Negative for depression, negative for anhedonia.      Objective:  PHYSICAL EXAM:   VS: BP 124/86   Pulse 60   Temp 97.8 F (36.6 C)   Ht 5' 7 (1.702 m)   Wt 249 lb 12.8 oz (113.3 kg)   SpO2 98%   BMI 39.12 kg/m   GEN: Well nourished, well developed, in no acute distress   Cardiac: RRR; no murmurs, rubs, or gallops,no edema -   Respiratory:  normal respiratory rate and pattern with no distress - normal breath sounds with no rales, rhonchi, wheezes or rubs  MS: no deformity or atrophy  Skin: warm and dry, no rash  Neuro:  Alert and Oriented x 3,  - CN II-Xii grossly intact Psych: euthymic mood, appropriate affect and demeanor   Lab Results  Component Value Date   TSH 3.110 10/12/2023   Lab Results  Component Value Date   WBC 6.5 10/12/2023   HGB 15.0 10/12/2023   HCT 43.7 10/12/2023   MCV 88 10/12/2023   PLT 235 10/12/2023   Lab Results  Component Value Date   NA 140 10/12/2023   K 3.4 (L) 10/12/2023   CO2 25 10/12/2023   GLUCOSE 88 10/12/2023   BUN 17 10/12/2023   CREATININE 1.21 (H) 10/12/2023   BILITOT 0.5 10/12/2023   ALKPHOS 56 10/12/2023   AST 26 10/12/2023   ALT 38 (H) 10/12/2023   PROT 6.5 10/12/2023   ALBUMIN 4.2 10/12/2023   CALCIUM 9.5 10/12/2023   EGFR 56 (L) 10/12/2023   GFR 67.41 11/06/2018   Lab Results  Component Value Date   CHOL 171 10/12/2023   Lab Results  Component Value Date   HDL 56 10/12/2023   Lab Results  Component Value Date   LDLCALC 98 10/12/2023   Lab Results  Component Value Date   TRIG 90 10/12/2023   Lab Results  Component Value Date   CHOLHDL 3.1 10/12/2023   Lab Results  Component Value Date   HGBA1C 5.5 10/12/2023      Assessment & Plan:   Problem List Items Addressed This Visit       Respiratory  Allergic rhinitis Continue current meds   Other Visit Diagnoses     Essential hypertension    -  Primary      Relevant Orders   Labwork pending Continue current meds and follow up with hypertensive clinic      GERD   Continue pepcid       Moderate episode of recurrent major depressive disorder (HCC)   Continue wellbutrin  as directed   Vitamin D  deficiency       Relevant Orders   VITAMIN D  25 Hydroxy (Vit-D Deficiency, Fractures)   Impaired fasting glucose       Relevant Orders   Hemoglobin A1c   Need pneumonia  vaccine Prevnar 20 given     No orders of the defined types were placed in this encounter.   Follow-up: Return in about 6 months (around 10/13/2024) for fasting physical.    SARA R Kdyn Vonbehren, PA-C

## 2024-04-13 ENCOUNTER — Ambulatory Visit: Payer: Self-pay | Admitting: Physician Assistant

## 2024-04-13 LAB — LIPID PANEL
Chol/HDL Ratio: 2.9 ratio (ref 0.0–4.4)
Cholesterol, Total: 195 mg/dL (ref 100–199)
HDL: 67 mg/dL (ref >39)
LDL Chol Calc (NIH): 111 mg/dL — ABNORMAL HIGH (ref 0–99)
Triglycerides: 93 mg/dL (ref 0–149)
VLDL Cholesterol Cal: 17 mg/dL (ref 5–40)

## 2024-04-13 LAB — COMPREHENSIVE METABOLIC PANEL WITH GFR
ALT: 27 IU/L (ref 0–32)
AST: 24 IU/L (ref 0–40)
Albumin: 4.3 g/dL (ref 3.9–4.9)
Alkaline Phosphatase: 56 IU/L (ref 44–121)
BUN/Creatinine Ratio: 11 (ref 9–23)
BUN: 13 mg/dL (ref 6–24)
Bilirubin Total: 0.5 mg/dL (ref 0.0–1.2)
CO2: 23 mmol/L (ref 20–29)
Calcium: 9.5 mg/dL (ref 8.7–10.2)
Chloride: 99 mmol/L (ref 96–106)
Creatinine, Ser: 1.23 mg/dL — ABNORMAL HIGH (ref 0.57–1.00)
Globulin, Total: 2.7 g/dL (ref 1.5–4.5)
Glucose: 88 mg/dL (ref 70–99)
Potassium: 3.3 mmol/L — ABNORMAL LOW (ref 3.5–5.2)
Sodium: 140 mmol/L (ref 134–144)
Total Protein: 7 g/dL (ref 6.0–8.5)
eGFR: 55 mL/min/1.73 — ABNORMAL LOW (ref >59)

## 2024-04-13 LAB — HEMOGLOBIN A1C
Est. average glucose Bld gHb Est-mCnc: 105 mg/dL
Hgb A1c MFr Bld: 5.3 % (ref 4.8–5.6)

## 2024-04-13 LAB — CBC WITH DIFFERENTIAL/PLATELET
Basophils Absolute: 0.1 x10E3/uL (ref 0.0–0.2)
Basos: 1 %
EOS (ABSOLUTE): 0.2 x10E3/uL (ref 0.0–0.4)
Eos: 3 %
Hematocrit: 45.3 % (ref 34.0–46.6)
Hemoglobin: 15.3 g/dL (ref 11.1–15.9)
Immature Grans (Abs): 0 x10E3/uL (ref 0.0–0.1)
Immature Granulocytes: 0 %
Lymphocytes Absolute: 1.9 x10E3/uL (ref 0.7–3.1)
Lymphs: 29 %
MCH: 30.2 pg (ref 26.6–33.0)
MCHC: 33.8 g/dL (ref 31.5–35.7)
MCV: 90 fL (ref 79–97)
Monocytes Absolute: 0.5 x10E3/uL (ref 0.1–0.9)
Monocytes: 7 %
Neutrophils Absolute: 3.9 x10E3/uL (ref 1.4–7.0)
Neutrophils: 60 %
Platelets: 246 x10E3/uL (ref 150–450)
RBC: 5.06 x10E6/uL (ref 3.77–5.28)
RDW: 13.9 % (ref 11.7–15.4)
WBC: 6.5 x10E3/uL (ref 3.4–10.8)

## 2024-04-13 LAB — VITAMIN D 25 HYDROXY (VIT D DEFICIENCY, FRACTURES): Vit D, 25-Hydroxy: 60.3 ng/mL (ref 30.0–100.0)

## 2024-04-13 LAB — TSH: TSH: 2.22 u[IU]/mL (ref 0.450–4.500)

## 2024-04-21 LAB — GENECONNECT MOLECULAR SCREEN: Genetic Analysis Overall Interpretation: NEGATIVE

## 2024-04-24 ENCOUNTER — Other Ambulatory Visit: Payer: Self-pay | Admitting: Physician Assistant

## 2024-04-24 DIAGNOSIS — F5101 Primary insomnia: Secondary | ICD-10-CM

## 2024-05-02 DIAGNOSIS — L918 Other hypertrophic disorders of the skin: Secondary | ICD-10-CM | POA: Diagnosis not present

## 2024-05-12 ENCOUNTER — Other Ambulatory Visit: Payer: Self-pay | Admitting: Physician Assistant

## 2024-05-12 DIAGNOSIS — I10 Essential (primary) hypertension: Secondary | ICD-10-CM

## 2024-06-25 ENCOUNTER — Other Ambulatory Visit: Payer: Self-pay | Admitting: Physician Assistant

## 2024-06-25 DIAGNOSIS — F5101 Primary insomnia: Secondary | ICD-10-CM

## 2024-07-19 DIAGNOSIS — N912 Amenorrhea, unspecified: Secondary | ICD-10-CM | POA: Diagnosis not present

## 2024-08-19 ENCOUNTER — Other Ambulatory Visit: Payer: Self-pay | Admitting: Physician Assistant

## 2024-08-23 ENCOUNTER — Other Ambulatory Visit: Payer: Self-pay | Admitting: Physician Assistant

## 2024-08-23 DIAGNOSIS — F5101 Primary insomnia: Secondary | ICD-10-CM

## 2024-09-12 ENCOUNTER — Other Ambulatory Visit: Payer: Self-pay | Admitting: Cardiovascular Disease

## 2024-09-12 ENCOUNTER — Other Ambulatory Visit: Payer: Self-pay | Admitting: Physician Assistant

## 2024-09-12 DIAGNOSIS — R21 Rash and other nonspecific skin eruption: Secondary | ICD-10-CM

## 2024-09-12 DIAGNOSIS — F331 Major depressive disorder, recurrent, moderate: Secondary | ICD-10-CM

## 2024-09-12 DIAGNOSIS — I10 Essential (primary) hypertension: Secondary | ICD-10-CM

## 2024-09-13 ENCOUNTER — Other Ambulatory Visit (HOSPITAL_BASED_OUTPATIENT_CLINIC_OR_DEPARTMENT_OTHER): Payer: Self-pay | Admitting: Family

## 2024-09-13 DIAGNOSIS — I1A0 Resistant hypertension: Secondary | ICD-10-CM

## 2024-09-14 MED ORDER — CHLORTHALIDONE 25 MG PO TABS: 25.0000 mg | ORAL_TABLET | Freq: Every day | ORAL | 0 refills | Status: DC

## 2024-10-03 ENCOUNTER — Encounter (HOSPITAL_BASED_OUTPATIENT_CLINIC_OR_DEPARTMENT_OTHER): Payer: Self-pay | Admitting: Family

## 2024-10-03 ENCOUNTER — Ambulatory Visit (HOSPITAL_BASED_OUTPATIENT_CLINIC_OR_DEPARTMENT_OTHER): Admitting: Family

## 2024-10-03 VITALS — BP 126/74 | HR 58 | Ht 67.0 in | Wt 257.0 lb

## 2024-10-03 DIAGNOSIS — I10 Essential (primary) hypertension: Secondary | ICD-10-CM

## 2024-10-03 DIAGNOSIS — G4733 Obstructive sleep apnea (adult) (pediatric): Secondary | ICD-10-CM

## 2024-10-03 DIAGNOSIS — R002 Palpitations: Secondary | ICD-10-CM | POA: Diagnosis not present

## 2024-10-03 MED ORDER — CHLORTHALIDONE 25 MG PO TABS: 25.0000 mg | ORAL_TABLET | Freq: Every day | ORAL | 3 refills | Status: AC

## 2024-10-03 MED ORDER — SPIRONOLACTONE 25 MG PO TABS: 25.0000 mg | ORAL_TABLET | Freq: Every day | ORAL | 3 refills | Status: AC

## 2024-10-03 MED ORDER — CARVEDILOL 25 MG PO TABS: 25.0000 mg | ORAL_TABLET | Freq: Two times a day (BID) | ORAL | 3 refills | Status: AC

## 2024-10-03 NOTE — Progress Notes (Signed)
 "  Advanced Hypertension Clinic Assessment:    Date:  10/03/2024   ID:  Amanda Barrett, DOB 07-Sep-1976, MRN [id number]  PCP:  Nicholaus Credit, PA-C  Cardiologist:  Annabella Scarce, MD  Nephrologist:  Referring MD: Nicholaus Credit, PA-C   CC: Hypertension  History of Present Illness:    Amanda Barrett is a 48 y.o. female with a hx of hypertension, anemia, vitamin D  deficiency, depression, tobacco use, OSA here to follow up care in the [facility] Hypertension Clinic.   Amanda Barrett was diagnosed with hypertension when she was 48 years old. Amlodipine  previously stopped due to gum issues. 01/2022 developed hives including having her face blowing up having to use epipen  with ED visit 03/10/22 for angioedema. Her Nifedipine  dose was increased - hives went away for a few days then as soon as Prednisone   dose completed has recurrent hives and Nifedipine  discontinued. 04/2022 Clonidine  initiated which took some time to adjust to. Frequent Hydralazine  dosing previously dizzy.  Seen 08/2022 noting clondine and hydralazine  combination made her drowsy for a few hours. Hydrochlorothiazide  switched to chlorthalidone  and Clonidine  switched to patches.   Seen 10/2022 with BP in [facility] 110-160. Unfortunately insurance denied renal denervation.  Seen 02/14/23 noting more often taking two doses of Hydralazine  rather than three as missed midday dose. At that time, potassium supplement stopped and Spironolactone  initiated.   At visit 12/08/23 after weight loss BP lower and CLonidine  reduced.   She presents today for follow up.  Weight up 6 pounds over 6 months. Unfortunately Zepbound for OSA not covered by her insurances. She notes some perimenopause symptoms waking her in middle of night. No chest pain, exertional dyspnea, palpitations. Has been able to discontinue Clonidine  with BP remaining at goal <130/80.   Previous antihypertensives: Valsartan  - allergic reaction (swelling) 01/2022 Nifedipine  - allergic reaction  (hives, itching) trialed 09/2018, 09/2021, 11/2021 - eventually discontinued 03/26/22 Amlodipine  - gingival hyperplasia  Past Medical History:  Diagnosis Date   Anemia    Pernicious   Angio-edema    Hypertension 2000   Morbid (severe) obesity due to excess calories (HCC) 12/05/2023   bmi 41.50   Recurrent upper respiratory infection (URI)    Resistant hypertension 09/14/2018   Urticaria     Past Surgical History:  Procedure Laterality Date   ADENOIDECTOMY     MYOMECTOMY     Uterine Fibroids   SINOSCOPY     SINUS EXPLORATION     TONSILLECTOMY      Current Medications: Current Meds  Medication Sig   albuterol  (PROAIR  HFA) 108 (90 Base) MCG/ACT inhaler Inhale 2 puffs into the lungs every 4 (four) hours as needed for wheezing or shortness of breath.   buPROPion  (WELLBUTRIN  SR) 150 MG 12 hr tablet TAKE 1 TABLET BY MOUTH EVERY DAY   buPROPion  (WELLBUTRIN  XL) 300 MG 24 hr tablet TAKE 1 TABLET BY MOUTH EVERY DAY   carvedilol  (COREG ) 25 MG tablet TAKE 1 TABLET (25 MG TOTAL) BY MOUTH TWICE A DAY WITH MEALS   cetirizine  (ZYRTEC ) 10 MG tablet TAKE 1 TABLET BY MOUTH EVERY DAY   chlorthalidone  (HYGROTON ) 25 MG tablet Take 1 tablet (25 mg total) by mouth daily.   EPINEPHrine  0.3 mg/0.3 mL IJ SOAJ injection Inject 0.3 mg into the muscle as needed for anaphylaxis.   famotidine  (PEPCID ) 20 MG tablet TAKE 1 TABLET BY MOUTH TWICE A DAY   fluticasone  (FLONASE ) 50 MCG/ACT nasal spray Place 2 sprays into both nostrils daily.   hydrALAZINE  (APRESOLINE ) 50  MG tablet TAKE 1 TABLET (50 MG) BY MOUTH IN THE MORNING AND AT BEDTIME   spironolactone  (ALDACTONE ) 25 MG tablet TAKE 1 TABLET (25 MG TOTAL) BY MOUTH DAILY.   triamcinolone  cream (KENALOG ) 0.1 % APPLY 1 APPLICATION. TOPICALLY 2 (TWO) TIMES DAILY.   Vitamin D , Ergocalciferol , (DRISDOL ) 1.25 MG (50000 UNIT) CAPS capsule TAKE 1 CAPSULE (50,000 UNITS TOTAL) BY MOUTH EVERY 7 (SEVEN) DAYS   zolpidem  (AMBIEN ) 10 MG tablet TAKE 1 TABLET BY MOUTH EVERY DAY  AT BEDTIME AS NEEDED     Allergies:   Nifedipine  and Valsartan    Social History   Socioeconomic History   Marital status: Married    Spouse name: Amanda Barrett   Number of children: Not on file   Years of education: Not on file   Highest education level: Bachelor's degree (e.g., BA, AB, BS)  Occupational History   Occupation: Runner, Broadcasting/film/video    Comment: Owner  Tobacco Use   Smoking status: Some Days    Current packs/day: 0.25    Average packs/day: 0.3 packs/day for 20.0 years (5.0 ttl pk-yrs)    Types: Cigarettes    Passive exposure: Current   Smokeless tobacco: Never  Vaping Use   Vaping status: Never Used  Substance and Sexual Activity   Alcohol use: No   Drug use: No   Sexual activity: Yes    Partners: Male    Birth control/protection: None  Other Topics Concern   Not on file  Social History Narrative   Not on file   Social Drivers of Health   Tobacco Use: High Risk (10/03/2024)   Patient History    Smoking Tobacco Use: Some Days    Smokeless Tobacco Use: Never    Passive Exposure: Current  Financial Resource Strain: Low Risk (10/01/2024)   Overall Financial Resource Strain (CARDIA)    Difficulty of Paying Living Expenses: Not very hard  Food Insecurity: No Food Insecurity (10/01/2024)   Epic    Worried About Radiation Protection Practitioner of Food in the Last Year: Never true    Ran Out of Food in the Last Year: Never true  Transportation Needs: No Transportation Needs (10/01/2024)   Epic    Lack of Transportation (Medical): No    Lack of Transportation (Non-Medical): No  Physical Activity: Insufficiently Active (10/01/2024)   Exercise Vital Sign    Days of Exercise per Week: 3 days    Minutes of Exercise per Session: 20 min  Stress: No Stress Concern Present (10/01/2024)   [employer] of Occupational Health - Occupational Stress Questionnaire    Feeling of Stress: Only a little  Social Connections: Moderately Integrated (10/01/2024)   Social  Connection and Isolation Panel    Frequency of Communication with Friends and Family: More than three times a week    Frequency of Social Gatherings with Friends and Family: More than three times a week    Attends Religious Services: 1 to 4 times per year    Active Member of Clubs or Organizations: No    Attends Banker Meetings: Not on file    Marital Status: Married  Depression (PHQ2-9): Low Risk (04/12/2024)   Depression (PHQ2-9)    PHQ-2 Score: 1  Alcohol Screen: Low Risk (10/01/2024)   Alcohol Screen    Last Alcohol Screening Score (AUDIT): 1  Housing: Low Risk (10/01/2024)   Epic    Unable to Pay for Housing in the Last Year: No    Number of Times Moved in the Last  Year: 0    Homeless in the Last Year: No  Utilities: Not At Risk (08/31/2022)   AHC Utilities    Threatened with loss of utilities: No  Health Literacy: Not on file     Family History: The patient's family history includes Cancer in her maternal grandmother and paternal grandfather; Clotting disorder in her maternal grandfather; Diabetes in her father, maternal grandfather, maternal grandmother, mother, and paternal grandmother; Heart attack in her maternal grandmother and mother; Heart disease in her maternal grandmother and mother; Hypertension in her father, maternal grandmother, mother, paternal grandfather, and paternal grandmother; Hypotension in her maternal grandfather; Kidney disease in her maternal grandmother; Other in her maternal grandfather; Stroke in her mother. There is no history of Colon cancer, Rectal cancer, Stomach cancer, or Esophageal cancer.  ROS:   Please see the history of present illness.     All other systems reviewed and are negative.  EKGs/Labs/Other Studies Reviewed:         11/09/18 VAS US  renal  Possible bilateral renal artery stenosis but accuracy of study limited by  vessel  tortuosity.  Consideration of CT Angiogram or contrast angiogram if pt renal function  is   reasonable and still high index of suspicion.    12/18/18 CT angio abdomen pelvis IMPRESSION: VASCULAR   1. No evidence of renal artery stenosis or other lesion to suggest a renovascular component of hypertension.   NON-VASCULAR:.   1. Fatty liver. 2. Scattered descending and sigmoid diverticula. 3. Degenerative disc disease L4-S1.  Recent Labs: 04/12/2024: ALT 27; BUN 13; Creatinine, Ser 1.23; Hemoglobin 15.3; Platelets 246; Potassium 3.3; Sodium 140; TSH 2.220   Recent Lipid Panel    Component Value Date/Time   CHOL 195 04/12/2024 0955   TRIG 93 04/12/2024 0955   HDL 67 04/12/2024 0955   CHOLHDL 2.9 04/12/2024 0955   CHOLHDL 3 10/18/2018 1027   VLDL 14.0 10/18/2018 1027   LDLCALC 111 (H) 04/12/2024 0955    Physical Exam:   VS:  BP 126/74 (BP Location: Left Arm, Patient Position: Sitting, Cuff Size: Large)   Pulse (!) 58   Ht 5' 7 (1.702 m)   Wt 257 lb (116.6 kg)   SpO2 98%   BMI 40.25 kg/m  , BMI Body mass index is 40.25 kg/m. GENERAL:  Well appearing, overweight HEENT: Pupils equal round and reactive, fundi not visualized, oral mucosa unremarkable NECK:  No jugular venous distention, waveform within normal limits, carotid upstroke brisk and symmetric, no bruits, no thyromegaly LYMPHATICS:  No cervical adenopathy LUNGS:  Clear to auscultation bilaterally HEART:  RRR.  PMI not displaced or sustained,S1 and S2 within normal limits, no S3, no S4, no clicks, no rubs, no murmurs ABD:  Flat, nontender, nondistended  EXT:  2 plus pulses throughout, no edema, no cyanosis no clubbing SKIN:  No rashes no nodules NEURO:  Cranial nerves II through XII grossly intact, motor grossly intact throughout PSYCH:  Cognitively intact, oriented to person place and time   ASSESSMENT/PLAN:    Hypertension -BP at goal <130/80. We will continue carvedilol  25mg  BID, Chlorthalidone  25mg  daily, hydralazine  50mg  BID, Spironolactone  25mg  daily at present doses. Refills provided. Would  hesitate to use ACE/ARB/Arni due to previous angioedema with valsartan .  Of note, if BP routinely <120/80 with continued weight loss could consider reducing antihypertensive regimen.  Insurance previously unfortunately denied RDN OSA treated as below Prior renal duplex no stenosis Prior cortisol, renin-aldosterone, TSH, catecholamines, metanephrines unremarkable.  OSA - CPAP compliance encouraged. Not presently  using routinely.   Prediabetes / Obesity - Has made significant lifestyle changes to lower weight. GLP1 not covered by insurance for weight loss. Continued lifestyle changes for weight loss encouraged.   Depression-follows with primary care provider  Palpitations - Quiescent. If recurrence, consider ZIO.  Encouraged to hydrate well, limit caffeine, manage stress well.  Tobacco use- Smoking cessation encouraged. Has reduced from prior which is great. Recommend utilization of [id number].   Screening for Secondary Hypertension:     07/01/2022    1:31 PM  Causes  Drugs/Herbals Screened  Renovascular HTN Screened     - Comments 12/2018 negative CT  Sleep Apnea Screened     - Comments 06/2022 [facility] sleep study  Thyroid  Disease Screened     - Comments 02/2022 normal TSH  Hyperaldosteronism Screened     - Comments 11/2018 normal renin aldosterone  Pheochromocytoma Screened     - Comments 06/2022 labs ordered  Cushing's Syndrome Screened     - Comments 06/2022 labs ordered  Coarctation of the Aorta Not Screened  Compliance Screened     - Comments 06/2022 Hydralaine BID instead of QID    Relevant Labs/Studies:    Latest Ref Rng & Units 04/12/2024    9:55 AM 10/12/2023   10:12 AM 06/03/2023    1:01 PM  Basic Labs  Sodium 134 - 144 mmol/L 140  140  140   Potassium 3.5 - 5.2 mmol/L 3.3  3.4  4.1   Creatinine 0.57 - 1.00 mg/dL 8.76  8.78  8.81        Latest Ref Rng & Units 04/12/2024    9:55 AM 10/12/2023   10:12 AM  Thyroid    TSH 0.450 - 4.500 uIU/mL 2.220  3.110        Latest  Ref Rng & Units 11/06/2018    3:48 PM  Renin/Aldosterone   Aldosterone  ng/dL 21        Latest Ref Rng & Units 08/18/2022   10:57 AM  Metanephrines/Catecholamines   Epinephrine  0 - 62 pg/mL 18   Norepinephrine 0 - 874 pg/mL 266   Dopamine 0 - 48 pg/mL <30   Metanephrines 0.0 - 88.0 pg/mL <25.0   Normetanephrines  0.0 - 218.9 pg/mL 36.0           11/09/2018    9:18 AM  Renovascular   Renal Artery US  Completed Yes     Disposition:    FU with PharmD/MD/APP in 1 year   Medication Adjustments/Labs and Tests Ordered: Current medicines are reviewed at length with the patient today.  Concerns regarding medicines are outlined above.  No orders of the defined types were placed in this encounter.  No orders of the defined types were placed in this encounter.    Signed, Reche GORMAN Finder, NP  10/03/2024 1:51 PM    [facility] "

## 2024-10-03 NOTE — Patient Instructions (Addendum)
 Medication Instructions:  Continue your current medications.  If you want to try moving your Spironolactone  to the evening you may.    Labwork: Your cholesterol in July was mildly elevated. Your LDL (bad cholesterol)  was 111 and we would like it to be less than 100.   Follow-Up: Please follow up in 12 months in [facility] with Dr. Raford, Reche Finder, NP or Allean Mink PharmD    Special Instructions:   If your blood pressure is consistently more than 130/80 or less than 120/80.   To prevent lightheadedness: Stay well hydrated Make position changes slowly Eat regular meals

## 2024-10-08 ENCOUNTER — Encounter: Payer: Self-pay | Admitting: Physician Assistant

## 2024-10-08 ENCOUNTER — Ambulatory Visit: Admitting: Physician Assistant

## 2024-10-08 VITALS — BP 132/80 | HR 71 | Temp 97.0°F | Resp 18 | Ht 67.0 in | Wt 256.0 lb

## 2024-10-08 DIAGNOSIS — J4520 Mild intermittent asthma, uncomplicated: Secondary | ICD-10-CM

## 2024-10-08 DIAGNOSIS — I10 Essential (primary) hypertension: Secondary | ICD-10-CM

## 2024-10-08 DIAGNOSIS — E559 Vitamin D deficiency, unspecified: Secondary | ICD-10-CM

## 2024-10-08 DIAGNOSIS — F5101 Primary insomnia: Secondary | ICD-10-CM

## 2024-10-08 DIAGNOSIS — R7301 Impaired fasting glucose: Secondary | ICD-10-CM | POA: Diagnosis not present

## 2024-10-08 DIAGNOSIS — F331 Major depressive disorder, recurrent, moderate: Secondary | ICD-10-CM | POA: Diagnosis not present

## 2024-10-08 DIAGNOSIS — K219 Gastro-esophageal reflux disease without esophagitis: Secondary | ICD-10-CM

## 2024-10-08 DIAGNOSIS — Z23 Encounter for immunization: Secondary | ICD-10-CM

## 2024-10-08 NOTE — Progress Notes (Signed)
 "  Established Patient Office Visit  Subjective:  Patient ID: Amanda Barrett, female    DOB: [birth year]  Age: 49 y.o. MRN: [id number]  CC:  Chief Complaint  Patient presents with   Medical Management of Chronic Issues    HPI Amanda Barrett presents for chronic follow up  Pt with history of hypertension.  She has had issues since age 38.  She currently is following with the blood pressure clinic in [place] because she has had uncontrolled hypertension that has been resistant to medications and pt also allergic to a few bp medications.   She has had her kidneys evaluated in the past.   Her current bp meds include coreg , chlorthalidone , aldactone , hydralazine   BP stable at 132/80 She recently saw cardiology and they will continue to follow her yearly now She denies chest pain, sob and edema  Pt with  mild depression - states symptoms stable on wellbutrin  XL 300mg  qd with wellbutrin  150mg  qd  She has allergic rhinitis and mild intermittent asthma - she uses albuterol  prn and takes zyrtec , flonase  and pepcid   Pt with insomnia - takes ambien  10mg  as needed which works well for her  Pt with  Vit D def - taking weekly supplement and due for labwork   Pt would like flu  vaccine  Past Medical History:  Diagnosis Date   Anemia    Pernicious   Angio-edema    Hypertension 2000   Morbid (severe) obesity due to excess calories (HCC) 12/05/2023   bmi 41.50   Recurrent upper respiratory infection (URI)    Resistant hypertension 09/14/2018   Urticaria     Past Surgical History:  Procedure Laterality Date   ADENOIDECTOMY     MYOMECTOMY     Uterine Fibroids   SINOSCOPY     SINUS EXPLORATION     TONSILLECTOMY      Family History  Problem Relation Age of Onset   Stroke Mother    Hypertension Mother    Diabetes Mother        type 1   Heart disease Mother    Heart attack Mother    Hypertension Father    Diabetes Father        Type 2   Cancer Maternal Grandmother         Ovarian   Heart disease Maternal Grandmother    Hypertension Maternal Grandmother    Diabetes Maternal Grandmother        Type 1   Kidney disease Maternal Grandmother    Heart attack Maternal Grandmother    Diabetes Maternal Grandfather        Type 2   Other Maternal Grandfather        ITP   Hypotension Maternal Grandfather    Clotting disorder Maternal Grandfather    Diabetes Paternal Grandmother        Type 2   Hypertension Paternal Grandmother    Cancer Paternal Grandfather        Bladder   Hypertension Paternal Grandfather    Colon cancer Neg Hx    Rectal cancer Neg Hx    Stomach cancer Neg Hx    Esophageal cancer Neg Hx     Social History   Socioeconomic History   Marital status: Married    Spouse name: Gus Peckinpaugh   Number of children: Not on file   Years of education: Not on file   Highest education level: Bachelor's degree (e.g., BA, AB, BS)  Occupational History   Occupation: [employer]    Comment: Owner  Tobacco Use   Smoking status: Some Days    Current packs/day: 0.25    Average packs/day: 0.3 packs/day for 20.0 years (5.0 ttl pk-yrs)    Types: Cigarettes    Passive exposure: Current   Smokeless tobacco: Never  Vaping Use   Vaping status: Never Used  Substance and Sexual Activity   Alcohol use: No   Drug use: No   Sexual activity: Yes    Partners: Male    Birth control/protection: None  Other Topics Concern   Not on file  Social History Narrative   Not on file   Social Drivers of Health   Tobacco Use: High Risk (10/08/2024)   Patient History    Smoking Tobacco Use: Some Days    Smokeless Tobacco Use: Never    Passive Exposure: Current  Financial Resource Strain: Low Risk (10/01/2024)   Overall Financial Resource Strain (CARDIA)    Difficulty of Paying Living Expenses: Not very hard  Food Insecurity: No Food Insecurity (10/01/2024)   Epic    Worried About Radiation Protection Practitioner of Food in the Last Year: Never true    Ran Out of  Food in the Last Year: Never true  Transportation Needs: No Transportation Needs (10/01/2024)   Epic    Lack of Transportation (Medical): No    Lack of Transportation (Non-Medical): No  Physical Activity: Insufficiently Active (10/01/2024)   Exercise Vital Sign    Days of Exercise per Week: 3 days    Minutes of Exercise per Session: 20 min  Stress: No Stress Concern Present (10/01/2024)   [employer] of Occupational Health - Occupational Stress Questionnaire    Feeling of Stress: Only a little  Social Connections: Moderately Integrated (10/01/2024)   Social Connection and Isolation Panel    Frequency of Communication with Friends and Family: More than three times a week    Frequency of Social Gatherings with Friends and Family: More than three times a week    Attends Religious Services: 1 to 4 times per year    Active Member of [employer] or Organizations: No    Attends Engineer, Structural: Not on file    Marital Status: Married  Catering Manager Violence: Not on file  Depression (PHQ2-9): Low Risk (10/08/2024)   Depression (PHQ2-9)    PHQ-2 Score: 0  Alcohol Screen: Low Risk (10/01/2024)   Alcohol Screen    Last Alcohol Screening Score (AUDIT): 1  Housing: Low Risk (10/01/2024)   Epic    Unable to Pay for Housing in the Last Year: No    Number of Times Moved in the Last Year: 0    Homeless in the Last Year: No  Utilities: Not At Risk (08/31/2022)   [facility] Utilities    Threatened with loss of utilities: No  Health Literacy: Not on file     Current Outpatient Medications:    albuterol  (PROAIR  HFA) 108 (90 Base) MCG/ACT inhaler, Inhale 2 puffs into the lungs every 4 (four) hours as needed for wheezing or shortness of breath., Disp: 1 each, Rfl: 2   buPROPion  (WELLBUTRIN  SR) 150 MG 12 hr tablet, TAKE 1 TABLET BY MOUTH EVERY DAY, Disp: 90 tablet, Rfl: 0   buPROPion  (WELLBUTRIN  XL) 300 MG 24 hr tablet, TAKE 1 TABLET BY MOUTH EVERY DAY, Disp: 90 tablet, Rfl: 0   carvedilol   (COREG ) 25 MG tablet, Take 1 tablet (25 mg total) by mouth 2 (two) times daily with a meal., Disp: 180  tablet, Rfl: 3   cetirizine  (ZYRTEC ) 10 MG tablet, TAKE 1 TABLET BY MOUTH EVERY DAY, Disp: 90 tablet, Rfl: 1   chlorthalidone  (HYGROTON ) 25 MG tablet, Take 1 tablet (25 mg total) by mouth daily., Disp: 90 tablet, Rfl: 3   EPINEPHrine  0.3 mg/0.3 mL IJ SOAJ injection, Inject 0.3 mg into the muscle as needed for anaphylaxis., Disp: 1 each, Rfl: 1   famotidine  (PEPCID ) 20 MG tablet, TAKE 1 TABLET BY MOUTH TWICE A DAY, Disp: 180 tablet, Rfl: 0   fluticasone  (FLONASE ) 50 MCG/ACT nasal spray, Place 2 sprays into both nostrils daily., Disp: 16 g, Rfl: 0   hydrALAZINE  (APRESOLINE ) 50 MG tablet, TAKE 1 TABLET (50 MG) BY MOUTH IN THE MORNING AND AT BEDTIME, Disp: 180 tablet, Rfl: 0   spironolactone  (ALDACTONE ) 25 MG tablet, Take 1 tablet (25 mg total) by mouth daily., Disp: 90 tablet, Rfl: 3   triamcinolone  cream (KENALOG ) 0.1 %, APPLY 1 APPLICATION. TOPICALLY 2 (TWO) TIMES DAILY., Disp: 30 g, Rfl: 0   Vitamin D , Ergocalciferol , (DRISDOL ) 1.25 MG (50000 UNIT) CAPS capsule, TAKE 1 CAPSULE (50,000 UNITS TOTAL) BY MOUTH EVERY 7 (SEVEN) DAYS, Disp: 12 capsule, Rfl: 1   zolpidem  (AMBIEN ) 10 MG tablet, TAKE 1 TABLET BY MOUTH EVERY DAY AT BEDTIME AS NEEDED, Disp: 30 tablet, Rfl: 1   Allergies  Allergen Reactions   Nifedipine  Hives and Itching   Valsartan  Swelling    CONSTITUTIONAL: Negative for chills, fatigue, fever, unintentional weight gain and unintentional weight loss.  E/N/T: Negative for ear pain, nasal congestion and sore throat.  CARDIOVASCULAR: Negative for chest pain, dizziness, palpitations and pedal edema.  RESPIRATORY: Negative for recent cough and dyspnea.  GASTROINTESTINAL: Negative for abdominal pain, acid reflux symptoms, constipation, diarrhea, nausea and vomiting.  MSK: Negative for arthralgias and myalgias.  INTEGUMENTARY: Negative for rash.  NEUROLOGICAL: Negative for dizziness and  headaches.  PSYCHIATRIC: Negative for sleep disturbance and to question depression screen.  Negative for depression, negative for anhedonia.      Objective:  PHYSICAL EXAM:   VS: BP 132/80   Pulse 71   Temp (!) 97 F (36.1 C) (Temporal)   Resp 18   Ht 5' 7 (1.702 m)   Wt 256 lb (116.1 kg)   SpO2 98%   BMI 40.10 kg/m   GEN: Well nourished, well developed, in no acute distress  Cardiac: RRR; no murmurs, rubs, or gallops,no edema -  Respiratory:  normal respiratory rate and pattern with no distress - normal breath sounds with no rales, rhonchi, wheezes or rubs MS: no deformity or atrophy  Skin: warm and dry, no rash  Neuro:  Alert and Oriented x 3, - CN II-Xii grossly intact Psych: euthymic mood, appropriate affect and demeanor    Lab Results  Component Value Date   TSH 2.220 04/12/2024   Lab Results  Component Value Date   WBC 6.5 04/12/2024   HGB 15.3 04/12/2024   HCT 45.3 04/12/2024   MCV 90 04/12/2024   PLT 246 04/12/2024   Lab Results  Component Value Date   NA 140 04/12/2024   K 3.3 (L) 04/12/2024   CO2 23 04/12/2024   GLUCOSE 88 04/12/2024   BUN 13 04/12/2024   CREATININE 1.23 (H) 04/12/2024   BILITOT 0.5 04/12/2024   ALKPHOS 56 04/12/2024   AST 24 04/12/2024   ALT 27 04/12/2024   PROT 7.0 04/12/2024   ALBUMIN 4.3 04/12/2024   CALCIUM 9.5 04/12/2024   EGFR 55 (L) 04/12/2024   GFR 67.41  11/06/2018   Lab Results  Component Value Date   CHOL 195 04/12/2024   Lab Results  Component Value Date   HDL 67 04/12/2024   Lab Results  Component Value Date   LDLCALC 111 (H) 04/12/2024   Lab Results  Component Value Date   TRIG 93 04/12/2024   Lab Results  Component Value Date   CHOLHDL 2.9 04/12/2024   Lab Results  Component Value Date   HGBA1C 5.3 04/12/2024      Assessment & Plan:   Problem List Items Addressed This Visit       Respiratory   Mild intermittent asthma without complication Continue current meds   Other Visit  Diagnoses     Essential hypertension    -  Primary      Relevant Orders   Labwork pending Continue current meds and follow up with hypertensive clinic      GERD   Continue pepcid       Moderate episode of recurrent major depressive disorder (HCC)   Continue wellbutrin  as directed   Vitamin D  deficiency       Relevant Orders   VITAMIN D  25 Hydroxy (Vit-D Deficiency, Fractures)   Impaired fasting glucose       Relevant Orders   Hemoglobin A1c  Primary insomnia Continue ambien    Need flu vaccine Flublok given    No orders of the defined types were placed in this encounter.   Follow-up: Return in about 6 months (around 04/07/2025) for chronic fasting follow-up.    SARA R Johnanthony Wilden, PA-C "

## 2024-10-09 ENCOUNTER — Ambulatory Visit: Payer: Self-pay | Admitting: Physician Assistant

## 2024-10-09 LAB — CBC WITH DIFFERENTIAL/PLATELET
Basophils Absolute: 0 x10E3/uL (ref 0.0–0.2)
Basos: 1 %
EOS (ABSOLUTE): 0.1 x10E3/uL (ref 0.0–0.4)
Eos: 3 %
Hematocrit: 43.3 % (ref 34.0–46.6)
Hemoglobin: 14.4 g/dL (ref 11.1–15.9)
Immature Grans (Abs): 0 x10E3/uL (ref 0.0–0.1)
Immature Granulocytes: 0 %
Lymphocytes Absolute: 1.8 x10E3/uL (ref 0.7–3.1)
Lymphs: 38 %
MCH: 29 pg (ref 26.6–33.0)
MCHC: 33.3 g/dL (ref 31.5–35.7)
MCV: 87 fL (ref 79–97)
Monocytes Absolute: 0.5 x10E3/uL (ref 0.1–0.9)
Monocytes: 10 %
Neutrophils Absolute: 2.3 x10E3/uL (ref 1.4–7.0)
Neutrophils: 48 %
Platelets: 237 x10E3/uL (ref 150–450)
RBC: 4.97 x10E6/uL (ref 3.77–5.28)
RDW: 14.5 % (ref 11.7–15.4)
WBC: 4.7 x10E3/uL (ref 3.4–10.8)

## 2024-10-09 LAB — COMPREHENSIVE METABOLIC PANEL WITH GFR
ALT: 27 IU/L (ref 0–32)
AST: 22 IU/L (ref 0–40)
Albumin: 4.4 g/dL (ref 3.9–4.9)
Alkaline Phosphatase: 53 IU/L (ref 41–116)
BUN/Creatinine Ratio: 12 (ref 9–23)
BUN: 14 mg/dL (ref 6–24)
Bilirubin Total: 0.4 mg/dL (ref 0.0–1.2)
CO2: 23 mmol/L (ref 20–29)
Calcium: 9.7 mg/dL (ref 8.7–10.2)
Chloride: 99 mmol/L (ref 96–106)
Creatinine, Ser: 1.18 mg/dL — ABNORMAL HIGH (ref 0.57–1.00)
Globulin, Total: 2.2 g/dL (ref 1.5–4.5)
Glucose: 88 mg/dL (ref 70–99)
Potassium: 3.1 mmol/L — ABNORMAL LOW (ref 3.5–5.2)
Sodium: 138 mmol/L (ref 134–144)
Total Protein: 6.6 g/dL (ref 6.0–8.5)
eGFR: 58 mL/min/1.73 — ABNORMAL LOW

## 2024-10-09 LAB — LIPID PANEL
Chol/HDL Ratio: 2.9 ratio (ref 0.0–4.4)
Cholesterol, Total: 191 mg/dL (ref 100–199)
HDL: 67 mg/dL
LDL Chol Calc (NIH): 110 mg/dL — ABNORMAL HIGH (ref 0–99)
Triglycerides: 77 mg/dL (ref 0–149)
VLDL Cholesterol Cal: 14 mg/dL (ref 5–40)

## 2024-10-09 LAB — VITAMIN D 25 HYDROXY (VIT D DEFICIENCY, FRACTURES): Vit D, 25-Hydroxy: 48.1 ng/mL (ref 30.0–100.0)

## 2024-10-09 LAB — TSH: TSH: 2.83 u[IU]/mL (ref 0.450–4.500)

## 2024-10-09 LAB — HEMOGLOBIN A1C
Est. average glucose Bld gHb Est-mCnc: 108 mg/dL
Hgb A1c MFr Bld: 5.4 % (ref 4.8–5.6)

## 2024-10-15 ENCOUNTER — Ambulatory Visit: Admitting: Physician Assistant

## 2024-10-22 ENCOUNTER — Other Ambulatory Visit (HOSPITAL_BASED_OUTPATIENT_CLINIC_OR_DEPARTMENT_OTHER): Payer: Self-pay | Admitting: Physician Assistant

## 2024-10-22 DIAGNOSIS — Z1231 Encounter for screening mammogram for malignant neoplasm of breast: Secondary | ICD-10-CM

## 2024-10-24 ENCOUNTER — Other Ambulatory Visit: Payer: Self-pay | Admitting: Physician Assistant

## 2024-10-24 DIAGNOSIS — F5101 Primary insomnia: Secondary | ICD-10-CM

## 2024-11-27 ENCOUNTER — Encounter (HOSPITAL_BASED_OUTPATIENT_CLINIC_OR_DEPARTMENT_OTHER): Admitting: Radiology

## 2024-11-27 ENCOUNTER — Ambulatory Visit (HOSPITAL_BASED_OUTPATIENT_CLINIC_OR_DEPARTMENT_OTHER): Admitting: Radiology

## 2024-11-27 DIAGNOSIS — Z1231 Encounter for screening mammogram for malignant neoplasm of breast: Secondary | ICD-10-CM

## 2025-04-03 ENCOUNTER — Ambulatory Visit: Admitting: Physician Assistant
